# Patient Record
Sex: Female | Born: 1975 | Race: Black or African American | Hispanic: No | Marital: Single | Smoking: Never smoker
Health system: Southern US, Community
[De-identification: ages and names within clinical notes are randomized; demographics above are authoritative.]

## PROBLEM LIST (undated history)

## (undated) ENCOUNTER — Emergency Department (HOSPITAL_BASED_OUTPATIENT_CLINIC_OR_DEPARTMENT_OTHER): Admission: EM | Payer: 59 | Source: Home / Self Care

## (undated) DIAGNOSIS — I10 Essential (primary) hypertension: Secondary | ICD-10-CM

## (undated) DIAGNOSIS — G932 Benign intracranial hypertension: Secondary | ICD-10-CM

## (undated) DIAGNOSIS — I509 Heart failure, unspecified: Secondary | ICD-10-CM

## (undated) DIAGNOSIS — E669 Obesity, unspecified: Secondary | ICD-10-CM

## (undated) DIAGNOSIS — G43909 Migraine, unspecified, not intractable, without status migrainosus: Secondary | ICD-10-CM

## (undated) DIAGNOSIS — G4730 Sleep apnea, unspecified: Secondary | ICD-10-CM

## (undated) HISTORY — PX: KNEE SURGERY: SHX244

## (undated) HISTORY — PX: ARTHROSCOPIC REPAIR ACL: SUR80

## (undated) HISTORY — PX: CHOLECYSTECTOMY: SHX55

## (undated) HISTORY — PX: MYOMECTOMY: SHX85

---

## 2011-05-12 ENCOUNTER — Emergency Department (HOSPITAL_COMMUNITY): Payer: Self-pay

## 2011-05-12 ENCOUNTER — Emergency Department (HOSPITAL_COMMUNITY)
Admission: EM | Admit: 2011-05-12 | Discharge: 2011-05-12 | Disposition: A | Discharge status: Home / Self Care | Payer: Self-pay | Attending: Emergency Medicine | Admitting: Emergency Medicine

## 2011-05-12 ENCOUNTER — Encounter: Payer: Self-pay | Admitting: *Deleted

## 2011-05-12 DIAGNOSIS — G43909 Migraine, unspecified, not intractable, without status migrainosus: Secondary | ICD-10-CM | POA: Insufficient documentation

## 2011-05-12 DIAGNOSIS — S93409A Sprain of unspecified ligament of unspecified ankle, initial encounter: Secondary | ICD-10-CM | POA: Insufficient documentation

## 2011-05-12 DIAGNOSIS — I10 Essential (primary) hypertension: Secondary | ICD-10-CM | POA: Insufficient documentation

## 2011-05-12 DIAGNOSIS — G932 Benign intracranial hypertension: Secondary | ICD-10-CM | POA: Insufficient documentation

## 2011-05-12 DIAGNOSIS — X500XXA Overexertion from strenuous movement or load, initial encounter: Secondary | ICD-10-CM | POA: Insufficient documentation

## 2011-05-12 HISTORY — DX: Benign intracranial hypertension: G93.2

## 2011-05-12 HISTORY — DX: Essential (primary) hypertension: I10

## 2011-05-12 HISTORY — DX: Migraine, unspecified, not intractable, without status migrainosus: G43.909

## 2011-05-12 MED ORDER — ACETAMINOPHEN 500 MG PO TABS
1000.0000 mg | ORAL_TABLET | Freq: Once | ORAL | Status: AC
  Administered 2011-05-12: 1000 mg via ORAL
  Filled 2011-05-12: qty 2

## 2011-05-12 MED ORDER — HYDROCODONE-ACETAMINOPHEN 5-325 MG PO TABS
1.0000 | ORAL_TABLET | ORAL | Status: AC | PRN
Start: 2011-05-12 — End: 2011-05-22

## 2011-05-12 NOTE — ED Notes (Signed)
Pt c/o pain in her left knee, ankle and foot after falling and twisting it last night. States that she can't bear weight on it.

## 2011-05-13 NOTE — ED Provider Notes (Signed)
Medical screening examination/treatment/procedure(s) were performed by non-physician practitioner and as supervising physician I was immediately available for consultation/collaboration.   Shelda Jakes, MD 05/13/11 [phone]

## 2011-05-13 NOTE — ED Provider Notes (Signed)
History     CSN: [id number] Arrival date & time: 05/12/2011  9:47 AM   First MD Initiated Contact with Patient 05/12/11 [phone]      Chief Complaint  Patient presents with  . Leg Pain    (Consider location/radiation/quality/duration/timing/severity/associated sxs/prior treatment) Patient is a 35 y.o. female presenting with ankle pain. The history is provided by the patient.  Ankle Pain  The incident occurred 12 to 24 hours ago. The incident occurred in the street (She was at a Christmas parade last night,  when she stepped wrong,  causing inversion to her left ankle.  She has pain at her left lateral ankle and is also tender at her left lateral knee.). The injury mechanism was torsion. The pain is present in the left ankle and left knee. The pain is at a severity of 10/10. The pain is severe. The pain has been constant since onset. Associated symptoms include inability to bear weight. Pertinent negatives include no numbness, no loss of sensation and no tingling. She reports no foreign bodies present. The symptoms are aggravated by bearing weight, palpation and activity. She has tried acetaminophen for the symptoms. The treatment provided no relief.    Past Medical History  Diagnosis Date  . Hypertension   . Pseudotumor cerebri   . Migraines     Past Surgical History  Procedure Date  . Cesarean section   . Cholecystectomy   . Myomectomy   . Knee surgery     right    History reviewed. No pertinent family history.  History  Substance Use Topics  . Smoking status: Never Smoker   . Smokeless tobacco: Not on file  . Alcohol Use: Not on file     occasionally    OB History    Grav Para Term Preterm Abortions TAB SAB Ect Mult Living                  Review of Systems  Constitutional: Negative for fever.  HENT: Negative for congestion, sore throat and neck pain.   Eyes: Negative.   Respiratory: Negative for chest tightness and shortness of breath.   Cardiovascular: Negative  for chest pain.  Gastrointestinal: Negative for nausea and abdominal pain.  Genitourinary: Negative.   Musculoskeletal: Positive for joint swelling, arthralgias and gait problem.  Skin: Negative.  Negative for rash and wound.  Neurological: Negative for dizziness, tingling, weakness, light-headedness, numbness and headaches.  Hematological: Negative.   Psychiatric/Behavioral: Negative.     Allergies  Imitrex and Ibuprofen  Home Medications   Current Outpatient Rx  Name Route Sig Dispense Refill  . ACETAMINOPHEN 500 MG PO TABS Oral Take 1,000 mg by mouth every 6 (six) hours as needed. Pain     . AMLODIPINE BESYLATE 10 MG PO TABS Oral Take 10 mg by mouth daily.      Marland Kitchen HYDROCHLOROTHIAZIDE 12.5 MG PO TABS Oral Take 12.5 mg by mouth daily.      Marland Kitchen LOSARTAN POTASSIUM 50 MG PO TABS Oral Take 50 mg by mouth daily.      Marland Kitchen NAPROXEN SODIUM 220 MG PO TABS Oral Take 220 mg by mouth 2 (two) times daily as needed.      Marland Kitchen HYDROCODONE-ACETAMINOPHEN 5-325 MG PO TABS Oral Take 1 tablet by mouth every 4 (four) hours as needed for pain. 20 tablet 0    BP 179/108  Pulse 109  Temp(Src) 98 F (36.7 C) (Oral)  Resp 20  Ht 5\' 3"  (1.6 m)  Wt 248 lb (112.492  kg)  BMI 43.93 kg/m2  SpO2 100%  LMP 05/05/2011  Physical Exam  Nursing note and vitals reviewed. Constitutional: She is oriented to person, place, and time. She appears well-developed and well-nourished.  HENT:  Head: Normocephalic.  Eyes: Conjunctivae are normal.  Neck: Normal range of motion.  Cardiovascular: Normal rate and intact distal pulses.  Exam reveals no decreased pulses.   Pulses:      Dorsalis pedis pulses are 2+ on the right side, and 2+ on the left side.       Posterior tibial pulses are 2+ on the right side, and 2+ on the left side.  Pulmonary/Chest: Effort normal.  Musculoskeletal: She exhibits edema and tenderness.       Left ankle: She exhibits decreased range of motion and swelling. She exhibits normal pulse. tenderness.  Lateral malleolus, CF ligament and proximal fibula tenderness found. Achilles tendon normal.  Neurological: She is alert and oriented to person, place, and time. No sensory deficit.  Skin: Skin is warm, dry and intact.    ED Course  Procedures (including critical care time)  Labs Reviewed - No data to display Dg Ankle Complete Left  05/12/2011  *RADIOLOGY REPORT*  Clinical Data: Fall, inversion injury, lateral ankle sprain  LEFT ANKLE COMPLETE - 3+ VIEW  Comparison: None  Findings:  No fracture or dislocation is seen.  The ankle mortise is intact.  The base of the fifth metatarsal is unremarkable.  Moderate diffuse ankle swelling.  IMPRESSION: No fracture or dislocation is seen.  Moderate diffuse ankle swelling.  Original Report Authenticated By: Charline Bills, M.D.   Dg Knee Complete 4 Views Left  05/12/2011  *RADIOLOGY REPORT*  Clinical Data: Fall, knee pain  LEFT KNEE - COMPLETE 4+ VIEW  Comparison: None.  Findings: No fracture or dislocation is seen.  The joint spaces are preserved.  The visualized soft tissues are unremarkable.  No suprapatellar knee joint effusion.  IMPRESSION: No acute osseous abnormality is seen.  Original Report Authenticated By: Charline Bills, M.D.     1. Ankle sprain       MDM  RICE,  [place],  Crutches,  Hydrocodone.  Referral to Dr. Romeo Apple in 1 week if no meaningful improvement by then.        Candis Musa, PA 05/13/11 1513

## 2011-05-20 ENCOUNTER — Emergency Department (HOSPITAL_COMMUNITY)
Admission: EM | Admit: 2011-05-20 | Discharge: 2011-05-20 | Disposition: A | Discharge status: Home / Self Care | Payer: Self-pay | Attending: Emergency Medicine | Admitting: Emergency Medicine

## 2011-05-20 ENCOUNTER — Emergency Department (HOSPITAL_COMMUNITY): Payer: Self-pay

## 2011-05-20 ENCOUNTER — Encounter (HOSPITAL_COMMUNITY): Payer: Self-pay | Admitting: Emergency Medicine

## 2011-05-20 DIAGNOSIS — R51 Headache: Secondary | ICD-10-CM

## 2011-05-20 DIAGNOSIS — G43909 Migraine, unspecified, not intractable, without status migrainosus: Secondary | ICD-10-CM | POA: Insufficient documentation

## 2011-05-20 DIAGNOSIS — I10 Essential (primary) hypertension: Secondary | ICD-10-CM | POA: Insufficient documentation

## 2011-05-20 LAB — BASIC METABOLIC PANEL
CO2: 29 mEq/L (ref 19–32)
Calcium: 9.4 mg/dL (ref 8.4–10.5)
Creatinine, Ser: 0.77 mg/dL (ref 0.50–1.10)
Glucose, Bld: 126 mg/dL — ABNORMAL HIGH (ref 70–99)

## 2011-05-20 LAB — DIFFERENTIAL
Basophils Absolute: 0.1 10*3/uL (ref 0.0–0.1)
Eosinophils Relative: 3 % (ref 0–5)
Lymphocytes Relative: 21 % (ref 12–46)
Monocytes Absolute: 0.5 10*3/uL (ref 0.1–1.0)

## 2011-05-20 LAB — CBC
HCT: 39 % (ref 36.0–46.0)
MCV: 82.3 fL (ref 78.0–100.0)
RDW: 13.7 % (ref 11.5–15.5)
WBC: 8.7 10*3/uL (ref 4.0–10.5)

## 2011-05-20 MED ORDER — ONDANSETRON HCL 4 MG PO TABS: 4.0000 mg | ORAL_TABLET | Freq: Four times a day (QID) | ORAL | Status: AC

## 2011-05-20 MED ORDER — ONDANSETRON HCL 4 MG/2ML IJ SOLN
4.0000 mg | Freq: Once | INTRAMUSCULAR | Status: AC
  Administered 2011-05-20: 4 mg via INTRAVENOUS
  Filled 2011-05-20: qty 2

## 2011-05-20 MED ORDER — HYDROMORPHONE HCL PF 1 MG/ML IJ SOLN
1.0000 mg | Freq: Once | INTRAMUSCULAR | Status: AC
Start: 2011-05-20 — End: 2011-05-20
  Administered 2011-05-20: 1 mg via INTRAVENOUS
  Filled 2011-05-20: qty 1

## 2011-05-20 MED ORDER — OXYCODONE-ACETAMINOPHEN 5-325 MG PO TABS: 2.0000 | ORAL_TABLET | ORAL | Status: AC | PRN

## 2011-05-20 MED ORDER — HYDROMORPHONE HCL PF 1 MG/ML IJ SOLN
INTRAMUSCULAR | Status: AC
  Administered 2011-05-20: 1 mg via INTRAVENOUS
  Filled 2011-05-20: qty 1

## 2011-05-20 MED ORDER — HYDROMORPHONE HCL PF 1 MG/ML IJ SOLN
1.0000 mg | Freq: Once | INTRAMUSCULAR | Status: AC
Start: 2011-05-20 — End: 2011-05-20
  Administered 2011-05-20: 1 mg via INTRAVENOUS

## 2011-05-20 MED ORDER — SODIUM CHLORIDE 0.9 % IV SOLN
INTRAVENOUS | Status: DC
  Administered 2011-05-20: 07:00:00 via INTRAVENOUS

## 2011-05-20 MED ORDER — HOMATROPINE HBR 5 % OP SOLN
OPHTHALMIC | Status: AC
  Filled 2011-05-20: qty 5

## 2011-05-20 NOTE — ED Provider Notes (Signed)
History     CSN: [id number] Arrival date & time: 05/20/2011  6:32 AM   First MD Initiated Contact with Patient 05/20/11 [phone]      Chief Complaint  Patient presents with  . Migraine    (Consider location/radiation/quality/duration/timing/severity/associated sxs/prior treatment) Patient is a 35 y.o. female presenting with migraine. The history is provided by the patient.  Migraine This is a new problem. The current episode started 3 to 5 hours ago. The problem occurs constantly. The problem has not changed since onset.Pertinent negatives include no chest pain and no abdominal pain. The symptoms are aggravated by nothing. The symptoms are relieved by nothing. She has tried acetaminophen for the symptoms. The treatment provided no relief.   She has had a headache like this previously with her pseudotumor cerebri. She was in the ED, recently with an ankle sprain. She is visiting [place] from [place], [place].  Past Medical History  Diagnosis Date  . Hypertension   . Pseudotumor cerebri   . Migraines     Past Surgical History  Procedure Date  . Cesarean section   . Cholecystectomy   . Myomectomy   . Knee surgery     right    No family history on file.  History  Substance Use Topics  . Smoking status: Never Smoker   . Smokeless tobacco: Not on file  . Alcohol Use: Yes     occasionally    OB History    Grav Para Term Preterm Abortions TAB SAB Ect Mult Living                  Review of Systems  Cardiovascular: Negative for chest pain.  Gastrointestinal: Negative for abdominal pain.  All other systems reviewed and are negative.    Allergies  Imitrex and Ibuprofen  Home Medications   Current Outpatient Rx  Name Route Sig Dispense Refill  . ACETAMINOPHEN 500 MG PO TABS Oral Take 1,000 mg by mouth every 6 (six) hours as needed. Pain     . AMLODIPINE BESYLATE 10 MG PO TABS Oral Take 10 mg by mouth daily.      Marland Kitchen HYDROCHLOROTHIAZIDE 12.5 MG PO TABS Oral Take  12.5 mg by mouth daily.      Marland Kitchen HYDROCODONE-ACETAMINOPHEN 5-325 MG PO TABS Oral Take 1 tablet by mouth every 4 (four) hours as needed for pain. 20 tablet 0  . LOSARTAN POTASSIUM 50 MG PO TABS Oral Take 50 mg by mouth daily.      Marland Kitchen NAPROXEN SODIUM 220 MG PO TABS Oral Take 220 mg by mouth 2 (two) times daily as needed.      Marland Kitchen ONDANSETRON HCL 4 MG PO TABS Oral Take 1 tablet (4 mg total) by mouth every 6 (six) hours. 12 tablet 0  . OXYCODONE-ACETAMINOPHEN 5-325 MG PO TABS Oral Take 2 tablets by mouth every 4 (four) hours as needed for pain. 15 tablet 0    BP 188/105  Pulse 87  Temp(Src) 98.1 F (36.7 C) (Oral)  Resp 20  Ht 5\' 3"  (1.6 m)  Wt 246 lb (111.585 kg)  BMI 43.58 kg/m2  SpO2 97%  LMP 05/05/2011  Physical Exam  Nursing note and vitals reviewed. Constitutional: She is oriented to person, place, and time. She appears well-developed and well-nourished. She appears distressed (Appears uncomfortable).  HENT:  Head: Normocephalic and atraumatic.  Eyes: Conjunctivae and EOM are normal. Pupils are equal, round, and reactive to light. Right eye exhibits no discharge. Left eye exhibits no discharge. No scleral  icterus.  Neck: Normal range of motion. Neck supple.  Cardiovascular: Normal rate.   Pulmonary/Chest: Effort normal and breath sounds normal. No respiratory distress.  Abdominal: Soft. Bowel sounds are normal.  Musculoskeletal: Normal range of motion.  Neurological: She is alert and oriented to person, place, and time. No cranial nerve deficit. Coordination normal.  Skin: Skin is warm and dry.  Psychiatric: She has a normal mood and affect. Her behavior is normal. Judgment and thought content normal.    ED Course  Procedures (including critical care time) ED treatment: Repeat blood pressure remained elevated 186/120 0640.  Labs Reviewed  BASIC METABOLIC PANEL - Abnormal; Notable for the following:    Glucose, Bld 126 (*)    All other components within normal limits  CBC    DIFFERENTIAL  URINALYSIS, ROUTINE W REFLEX MICROSCOPIC  URINE RAPID DRUG SCREEN (HOSP PERFORMED)   Headache, treated with Dilaudid, and Zofran- 0700 BP improved to 197/111. After CT, pt c/o only mild improvement of pain, with BP 188/105. Repeat Analgesia ordered.   08:08- BP 169/102. Pt feels some better. Attempted to view fundi, but pupils small, equal. Homatropine given for exam.  08:42- eyes are dialated; fundi per me appear normal with sharp medial discs.          Ct Head Wo Contrast  05/20/2011  *RADIOLOGY REPORT*  Clinical Data: Headache  CT HEAD WITHOUT CONTRAST  Technique:  Contiguous axial images were obtained from the base of the skull through the vertex without contrast.  Comparison: None.  Findings: No skull fracture is noted.  Paranasal sinuses and mastoid air cells are unremarkable.  No intracranial hemorrhage, mass effect or midline shift.  The gray and white matter differentiation is preserved.  No acute infarction.  No mass lesion is noted on this unenhanced scan.  IMPRESSION: No acute intracranial abnormality.  Original Report Authenticated By: Natasha Mead, M.D.     1. Headache   2. Hypertension       MDM  Headache with hypertension , and history of pseudotumor cerebri.  Doubt hypertensive urgency, meningitis, exacerbation of pseudotumor cerebri.        Flint Melter, MD 05/20/11 [phone]

## 2011-05-20 NOTE — ED Notes (Signed)
Pt states migraine pain starting approx 2 hours.  Pt reports she has taken an aleve and two tylenol without relief.  Pt reports dizziness and vomiting x 5.  Pt states emesis went from clear liquid to yellow liquid.

## 2011-05-20 NOTE — ED Notes (Signed)
Pt made aware that a urine specimen is needed for lab testing. Pt unable to give one at this time. Call light was left in reach. NAD noted at this time.

## 2011-05-20 NOTE — ED Notes (Signed)
EDP at bedside  

## 2011-10-11 ENCOUNTER — Emergency Department (HOSPITAL_COMMUNITY)
Admission: EM | Admit: 2011-10-11 | Discharge: 2011-10-12 | Disposition: A | Discharge status: Home / Self Care | Payer: PRIVATE HEALTH INSURANCE | Attending: Emergency Medicine | Admitting: Emergency Medicine

## 2011-10-11 ENCOUNTER — Encounter (HOSPITAL_COMMUNITY): Payer: Self-pay | Admitting: *Deleted

## 2011-10-11 ENCOUNTER — Emergency Department (HOSPITAL_COMMUNITY): Payer: PRIVATE HEALTH INSURANCE

## 2011-10-11 DIAGNOSIS — M2240 Chondromalacia patellae, unspecified knee: Secondary | ICD-10-CM | POA: Insufficient documentation

## 2011-10-11 DIAGNOSIS — M25469 Effusion, unspecified knee: Secondary | ICD-10-CM | POA: Insufficient documentation

## 2011-10-11 MED ORDER — ONDANSETRON 8 MG PO TBDP
8.0000 mg | ORAL_TABLET | Freq: Once | ORAL | Status: AC
  Administered 2011-10-12: 8 mg via ORAL
  Filled 2011-10-11: qty 1

## 2011-10-11 MED ORDER — HYDROMORPHONE HCL PF 1 MG/ML IJ SOLN
1.0000 mg | Freq: Once | INTRAMUSCULAR | Status: AC
  Administered 2011-10-12: 1 mg via INTRAMUSCULAR
  Filled 2011-10-11: qty 1

## 2011-10-11 NOTE — ED Notes (Signed)
Lt knee pain , NO known injury,

## 2011-10-12 MED ORDER — OXYCODONE-ACETAMINOPHEN 5-325 MG PO TABS: 1.0000 | ORAL_TABLET | ORAL | Status: AC | PRN

## 2011-10-12 MED ORDER — HYDROCHLOROTHIAZIDE 25 MG PO TABS: 25.0000 mg | ORAL_TABLET | Freq: Every day | ORAL | Status: DC

## 2011-10-12 NOTE — ED Provider Notes (Signed)
History     CSN: [id number]  Arrival date & time 10/11/11  2040   First MD Initiated Contact with Patient 10/11/11 2043      Chief Complaint  Patient presents with  . Knee Pain    (Consider location/radiation/quality/duration/timing/severity/associated sxs/prior treatment) HPI Comments: Annette Deleon presents for evaluation of increasing left knee pain.  She denies any obvious injury, but states intermittent twinges of pain in her left knee for some time now, which became persistent 2 weeks ago and now has become near intolerable.  She describes a deep throbbing pain in her knee joint with a sharp stabbing pain which radiates both into her lower extremity and up into her mid thigh with weightbearing and range of motion.  She denies fevers,  Chills, trauma, swelling.  She does have a previous history of trauma to the right knee which required arthroscopic surgery 6 years ago.    Patient is a 36 y.o. female presenting with knee pain. The history is provided by the patient.  Knee Pain Associated symptoms include arthralgias. Pertinent negatives include no abdominal pain, chest pain, congestion, fever, headaches, joint swelling, nausea, neck pain, numbness, rash, sore throat or weakness.    Past Medical History  Diagnosis Date  . Hypertension   . Pseudotumor cerebri   . Migraines     Past Surgical History  Procedure Date  . Cesarean section   . Cholecystectomy   . Myomectomy   . Knee surgery     right    History reviewed. No pertinent family history.  History  Substance Use Topics  . Smoking status: Never Smoker   . Smokeless tobacco: Not on file  . Alcohol Use: Yes     occasionally    OB History    Grav Para Term Preterm Abortions TAB SAB Ect Mult Living                  Review of Systems  Constitutional: Negative for fever.  HENT: Negative for congestion, sore throat and neck pain.   Eyes: Negative.   Respiratory: Negative for chest tightness and shortness of  breath.   Cardiovascular: Negative for chest pain.  Gastrointestinal: Negative for nausea and abdominal pain.  Genitourinary: Negative.   Musculoskeletal: Positive for arthralgias. Negative for joint swelling.  Skin: Negative.  Negative for rash and wound.  Neurological: Negative for dizziness, weakness, light-headedness, numbness and headaches.  Hematological: Negative.   Psychiatric/Behavioral: Negative.     Allergies  Imitrex; Yellow jacket venom; and Ibuprofen  Home Medications   Current Outpatient Rx  Name Route Sig Dispense Refill  . ACETAMINOPHEN 500 MG PO TABS Oral Take 1,000-1,500 mg by mouth every 6 (six) hours as needed. Pain    . AMLODIPINE BESYLATE 10 MG PO TABS Oral Take 10 mg by mouth daily.      Marland Kitchen GREEN TEA (CAMILLIA SINENSIS) 250 MG PO CAPS Oral Take 3 capsules by mouth 2 (two) times daily with a meal.    . HYDROCHLOROTHIAZIDE 12.5 MG PO TABS Oral Take 12.5 mg by mouth daily.      Marland Kitchen LOSARTAN POTASSIUM 50 MG PO TABS Oral Take 50 mg by mouth daily.      Marland Kitchen NAPROXEN SODIUM 220 MG PO TABS Oral Take 440 mg by mouth as needed. For pain    . TETRAHYDROZOLINE-ZN SULFATE 0.05-0.25 % OP SOLN Both Eyes Place 2 drops into both eyes daily as needed. For allergy eye relief    . OXYCODONE-ACETAMINOPHEN 5-325 MG PO TABS  Oral Take 1 tablet by mouth every 4 (four) hours as needed for pain. 30 tablet 0    BP 194/129  Pulse 87  Temp(Src) 98.1 F (36.7 C) (Oral)  Resp 22  Ht 5\' 6"  (1.676 m)  Wt 262 lb (118.842 kg)  BMI 42.29 kg/m2  SpO2 100%  LMP 09/10/2011  Physical Exam  Nursing note and vitals reviewed. Constitutional: She appears well-developed and well-nourished.  HENT:  Head: Normocephalic.  Cardiovascular: Normal rate and intact distal pulses.  Exam reveals no decreased pulses.   Pulses:      Dorsalis pedis pulses are 2+ on the left side.  Musculoskeletal: She exhibits tenderness.       Left knee: She exhibits decreased range of motion and effusion. She exhibits no  erythema, no LCL laxity and no MCL laxity. tenderness found. Medial joint line tenderness noted.  Neurological: She is alert. No sensory deficit.  Skin: Skin is warm, dry and intact.    ED Course  Procedures (including critical care time)  Labs Reviewed - No data to display Dg Knee Complete 4 Views Left  10/11/2011  *RADIOLOGY REPORT*  Clinical Data: Knee pain for 2 weeks.  LEFT KNEE - COMPLETE 4+ VIEW  Comparison: Plain films 05/12/2011.  Findings: No acute bony or joint abnormality is identified.  Small defect of subchondral bone of the mid pole of the talus compatible with chondromalacia.  There is a joint effusion.  IMPRESSION:  1.  No acute finding. 2.  Chondromalacia patella and joint effusion.  Original Report Authenticated By: Bernadene Bell. Maricela Curet, M.D.     1. Joint effusion of knee   2. Chondromalacia patella, left     Patient given dilaudid 1 mg IM and did obtain improvement in pain.  Knee immobilizer given.  Pt has crutches at home.  MDM  Acute on chronic pain with xray evidence for chondromalacia patella with effusion.  Patient encourged RICE,  Percocet prescribed.  Work note given.  Advised f/u with ortho.  Referred to Dr. Hilda Lias.  Pt also works as Charity fundraiser at [employer] - may decide to contact Dr. Charlann Boxer or Dr. Lequita Halt for f/u care.  Discussed BP - she has not taken her evening hctz yet,  But bp is improving with institution of pain med.  Pt encouraged to check bp over the next several days.  Denies ha,  Visual changes,  No sob,  No cp.        Burgess Amor, PA 10/12/11 1130

## 2011-10-12 NOTE — Discharge Instructions (Signed)
Knee Effusion The medical term for having fluid in your knee is effusion. This is often due to an internal derangement of the knee. This means something is wrong inside the knee. Some of the causes of fluid in the knee may be torn cartilage, a torn ligament, or bleeding into the joint from an injury. Your knee is likely more difficult to bend and move. This is often because there is increased pain and pressure in the joint. The time it takes for recovery from a knee effusion depends on different factors, including:   Type of injury.   Your age.   Physical and medical conditions.   Rehabilitation Strategies.  How long you will be away from your normal activities will depend on what kind of knee problem you have and how much damage is present. Your knee has two types of cartilage. Articular cartilage covers the bone ends and lets your knee bend and move smoothly. Two menisci, thick pads of cartilage that form a rim inside the joint, help absorb shock and stabilize your knee. Ligaments bind the bones together and support your knee joint. Muscles move the joint, help support your knee, and take stress off the joint itself. CAUSES  Often an effusion in the knee is caused by an injury to one of the menisci. This is often a tear in the cartilage. Recovery after a meniscus injury depends on how much meniscus is damaged and whether you have damaged other knee tissue. Small tears may heal on their own with conservative treatment. Conservative means rest, limited weight bearing activity and muscle strengthening exercises. Your recovery may take up to 6 weeks.  TREATMENT  Larger tears may require surgery. Meniscus injuries may be treated during arthroscopy. Arthroscopy is a procedure in which your surgeon uses a small telescope like instrument to look in your knee. Your caregiver can make a more accurate diagnosis (learning what is wrong) by performing an arthroscopic procedure. If your injury is on the inner  margin of the meniscus, your surgeon may trim the meniscus back to a smooth rim. In other cases your surgeon will try to repair a damaged meniscus with stitches (sutures). This may make rehabilitation take longer, but may provide better long term result by helping your knee keep its shock absorption capabilities. Ligaments which are completely torn usually require surgery for repair. HOME CARE INSTRUCTIONS  Use crutches as instructed.   If a brace is applied, use as directed.   Once you are home, an ice pack applied to your swollen knee may help with discomfort and help decrease swelling.   Keep your knee raised (elevated) when you are not up and around or on crutches.   Only take over-the-counter or prescription medicines for pain, discomfort, or fever as directed by your caregiver.   Your caregivers will help with instructions for rehabilitation of your knee. This often includes strengthening exercises.   You may resume a normal diet and activities as directed.  SEEK MEDICAL CARE IF:   There is increased swelling in your knee.   You notice redness, swelling, or increasing pain in your knee.   An unexplained oral temperature above 102 F (38.9 C) develops.  SEEK IMMEDIATE MEDICAL CARE IF:   You develop a rash.   You have difficulty breathing.   You have any allergic reactions from medications you may have been given.   There is severe pain with any motion of the knee.  MAKE SURE YOU:   Understand these instructions.  Will watch your condition.   Will get help right away if you are not doing well or get worse.  Document Released: 08/10/2003 Document Revised: 05/09/2011 Document Reviewed: 10/14/2007 [facility] Patient Information 2012 [place], [place].   Use the crutches that you have at home to help minimize weightbearing on your left knee.  Use the knee immobilizer to help protect and stabilize the knee joint.  Ice and elevation may also help with pain and swelling.  Use  the Percocet for pain relief, this will make you drowsy so do not drive within 4 hours of taking this medication.  Please call Dr. Hilda Lias on Monday for further evaluation and treatment of your knee injury.

## 2011-10-12 NOTE — ED Provider Notes (Signed)
Medical screening examination/treatment/procedure(s) were performed by non-physician practitioner and as supervising physician I was immediately available for consultation/collaboration.  Kyo Cocuzza, MD 10/12/11 1537 

## 2011-10-21 ENCOUNTER — Other Ambulatory Visit (HOSPITAL_COMMUNITY): Payer: Self-pay | Admitting: Orthopedic Surgery

## 2011-10-21 DIAGNOSIS — M25562 Pain in left knee: Secondary | ICD-10-CM

## 2011-10-24 ENCOUNTER — Other Ambulatory Visit (HOSPITAL_COMMUNITY): Payer: PRIVATE HEALTH INSURANCE

## 2011-10-25 ENCOUNTER — Ambulatory Visit (HOSPITAL_COMMUNITY)
Admission: RE | Admit: 2011-10-25 | Discharge: 2011-10-25 | Disposition: A | Discharge status: Home / Self Care | Payer: PRIVATE HEALTH INSURANCE | Source: Ambulatory Visit | Attending: Orthopedic Surgery | Admitting: Orthopedic Surgery

## 2011-10-25 DIAGNOSIS — M659 Unspecified synovitis and tenosynovitis, unspecified site: Secondary | ICD-10-CM | POA: Insufficient documentation

## 2011-10-25 DIAGNOSIS — IMO0002 Reserved for concepts with insufficient information to code with codable children: Secondary | ICD-10-CM | POA: Insufficient documentation

## 2011-10-25 DIAGNOSIS — M2240 Chondromalacia patellae, unspecified knee: Secondary | ICD-10-CM | POA: Insufficient documentation

## 2011-10-25 DIAGNOSIS — M25562 Pain in left knee: Secondary | ICD-10-CM

## 2011-10-25 DIAGNOSIS — X58XXXA Exposure to other specified factors, initial encounter: Secondary | ICD-10-CM | POA: Insufficient documentation

## 2011-11-20 ENCOUNTER — Emergency Department (HOSPITAL_COMMUNITY): Payer: PRIVATE HEALTH INSURANCE

## 2011-11-20 ENCOUNTER — Inpatient Hospital Stay (HOSPITAL_COMMUNITY)
Admission: EM | Admit: 2011-11-20 | Discharge: 2011-11-21 | DRG: 103 | Disposition: A | Discharge status: Home / Self Care | Payer: PRIVATE HEALTH INSURANCE | Attending: Internal Medicine | Admitting: Internal Medicine

## 2011-11-20 ENCOUNTER — Encounter (HOSPITAL_COMMUNITY): Payer: Self-pay

## 2011-11-20 ENCOUNTER — Inpatient Hospital Stay (HOSPITAL_COMMUNITY): Payer: PRIVATE HEALTH INSURANCE

## 2011-11-20 DIAGNOSIS — I10 Essential (primary) hypertension: Secondary | ICD-10-CM

## 2011-11-20 DIAGNOSIS — Z6841 Body Mass Index (BMI) 40.0 and over, adult: Secondary | ICD-10-CM

## 2011-11-20 DIAGNOSIS — I161 Hypertensive emergency: Secondary | ICD-10-CM

## 2011-11-20 DIAGNOSIS — G43809 Other migraine, not intractable, without status migrainosus: Secondary | ICD-10-CM

## 2011-11-20 DIAGNOSIS — G43109 Migraine with aura, not intractable, without status migrainosus: Principal | ICD-10-CM | POA: Diagnosis present

## 2011-11-20 DIAGNOSIS — G932 Benign intracranial hypertension: Secondary | ICD-10-CM

## 2011-11-20 DIAGNOSIS — E669 Obesity, unspecified: Secondary | ICD-10-CM

## 2011-11-20 DIAGNOSIS — G44059 Short lasting unilateral neuralgiform headache with conjunctival injection and tearing (SUNCT), not intractable: Secondary | ICD-10-CM | POA: Diagnosis present

## 2011-11-20 LAB — DIFFERENTIAL
Basophils Absolute: 0.1 10*3/uL (ref 0.0–0.1)
Basophils Relative: 1 % (ref 0–1)
Eosinophils Absolute: 0.1 10*3/uL (ref 0.0–0.7)
Eosinophils Relative: 2 % (ref 0–5)
Neutrophils Relative %: 68 % (ref 43–77)

## 2011-11-20 LAB — CBC
MCH: 27 pg (ref 26.0–34.0)
MCHC: 33.3 g/dL (ref 30.0–36.0)
MCV: 81.3 fL (ref 78.0–100.0)
Platelets: 410 10*3/uL — ABNORMAL HIGH (ref 150–400)
RBC: 4.92 MIL/uL (ref 3.87–5.11)
RDW: 13.9 % (ref 11.5–15.5)

## 2011-11-20 LAB — BASIC METABOLIC PANEL
Calcium: 10.2 mg/dL (ref 8.4–10.5)
GFR calc Af Amer: >90 mL/min (ref >90)
GFR calc non Af Amer: 85 mL/min — ABNORMAL LOW (ref >90)
Glucose, Bld: 103 mg/dL — ABNORMAL HIGH (ref 70–99)
Potassium: 3.6 mEq/L (ref 3.5–5.1)
Sodium: 136 mEq/L (ref 135–145)

## 2011-11-20 MED ORDER — AMLODIPINE BESYLATE 5 MG PO TABS
10.0000 mg | ORAL_TABLET | Freq: Every day | ORAL | Status: DC
  Administered 2011-11-20 – 2011-11-21 (×2): 10 mg via ORAL
  Filled 2011-11-20 (×2): qty 2

## 2011-11-20 MED ORDER — METOCLOPRAMIDE HCL 5 MG/ML IJ SOLN
10.0000 mg | Freq: Once | INTRAMUSCULAR | Status: AC
  Administered 2011-11-20: 10 mg via INTRAVENOUS
  Filled 2011-11-20: qty 2

## 2011-11-20 MED ORDER — NAPHAZOLINE HCL 0.1 % OP SOLN
2.0000 [drp] | Freq: Four times a day (QID) | OPHTHALMIC | Status: DC | PRN
  Filled 2011-11-20: qty 15

## 2011-11-20 MED ORDER — HYDROCHLOROTHIAZIDE 12.5 MG PO CAPS
12.5000 mg | ORAL_CAPSULE | Freq: Every day | ORAL | Status: DC
  Administered 2011-11-20 – 2011-11-21 (×2): 12.5 mg via ORAL
  Filled 2011-11-20 (×2): qty 1

## 2011-11-20 MED ORDER — LABETALOL HCL 5 MG/ML IV SOLN
20.0000 mg | Freq: Once | INTRAVENOUS | Status: AC
  Administered 2011-11-20: 20 mg via INTRAVENOUS
  Filled 2011-11-20: qty 4

## 2011-11-20 MED ORDER — ONDANSETRON HCL 4 MG/2ML IJ SOLN
4.0000 mg | Freq: Once | INTRAMUSCULAR | Status: AC
  Administered 2011-11-20: 4 mg via INTRAVENOUS

## 2011-11-20 MED ORDER — DIPHENHYDRAMINE HCL 50 MG/ML IJ SOLN
50.0000 mg | Freq: Once | INTRAMUSCULAR | Status: AC
  Administered 2011-11-20: 50 mg via INTRAVENOUS
  Filled 2011-11-20: qty 1

## 2011-11-20 MED ORDER — ONDANSETRON HCL 4 MG/2ML IJ SOLN
4.0000 mg | Freq: Four times a day (QID) | INTRAMUSCULAR | Status: DC | PRN
  Administered 2011-11-21: 4 mg via INTRAVENOUS
  Filled 2011-11-20 (×2): qty 2

## 2011-11-20 MED ORDER — OXYCODONE HCL 5 MG PO TABS
5.0000 mg | ORAL_TABLET | ORAL | Status: DC | PRN
  Administered 2011-11-20: 5 mg via ORAL
  Filled 2011-11-20: qty 1

## 2011-11-20 MED ORDER — HEPARIN SODIUM (PORCINE) 5000 UNIT/ML IJ SOLN
5000.0000 [IU] | Freq: Three times a day (TID) | INTRAMUSCULAR | Status: DC
  Administered 2011-11-20 – 2011-11-21 (×2): 5000 [IU] via SUBCUTANEOUS
  Filled 2011-11-20 (×4): qty 1

## 2011-11-20 MED ORDER — ONDANSETRON HCL 4 MG PO TABS: 4.0000 mg | ORAL_TABLET | Freq: Four times a day (QID) | ORAL | Status: DC | PRN

## 2011-11-20 MED ORDER — GADOBENATE DIMEGLUMINE 529 MG/ML IV SOLN
20.0000 mL | Freq: Once | INTRAVENOUS | Status: AC | PRN
  Administered 2011-11-20: 20 mL via INTRAVENOUS

## 2011-11-20 MED ORDER — LOSARTAN POTASSIUM 50 MG PO TABS
50.0000 mg | ORAL_TABLET | Freq: Every day | ORAL | Status: DC
  Administered 2011-11-20 – 2011-11-21 (×2): 50 mg via ORAL
  Filled 2011-11-20 (×2): qty 1

## 2011-11-20 MED ORDER — ACETAMINOPHEN 325 MG PO TABS: 650.0000 mg | ORAL_TABLET | ORAL | Status: DC | PRN

## 2011-11-20 NOTE — H&P (Signed)
Annette Deleon MRN: [id number] DOB/AGE: 01/12/1976 35 y.o.  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 No associated limb weakness or parathesiae. No LOC. H/O Pseudotumour cerebri but headache does not feel the same .  Past Medical History  Diagnosis Date  . Hypertension   . Pseudotumor cerebri   . Migraines    Past Surgical History  Procedure Date  . Cesarean section   . Cholecystectomy   . Myomectomy   . Knee surgery     right        No family history on file.  Social History:  Separated for 4 years.Lives with 2 kids and boyfriend.RN.Non-smoker. Occ alcohol.  Allergies:  Allergies  Allergen Reactions  . Imitrex (Sumatriptan Base) Anaphylaxis  . Yellow Jacket Venom (Bee Venom) Anaphylaxis  . Ibuprofen Nausea And Vomiting     (Not in a hospital admission)     FAO:ZHYQM from the symptoms mentioned above,there are no other symptoms referable to all systems reviewed.  Physical Exam: Blood pressure 147/101, pulse 83, temperature 98.1 F (36.7 C), temperature source Oral, resp. rate 17, height 5\' 4"  (1.626 m), weight 111.585 kg (246 lb), last menstrual period 11/19/2011, SpO2 100.00%. Looks well.Alert/orientated.Obese. No focal Neurology. CVS-WNL RESP-WNL ABDO-WNL     Basename 11/20/11 0927  WBC 8.6  NEUTROABS 5.9  HGB 13.3  HCT 40.0  MCV 81.3  PLT 410*    Basename 11/20/11 0927  NA 136  K 3.6  CL 98  CO2 27  GLUCOSE 103*  BUN 14  CREATININE 0.86  CALCIUM 10.2  MG --         Ct Head Wo Contrast  11/20/2011  *RADIOLOGY REPORT*  Clinical Data: Headache, numbness  CT HEAD WITHOUT CONTRAST  Technique:  Contiguous axial images were obtained from the base of the skull through the vertex without contrast.  Comparison:  05/20/2011  Findings: Normal ventricular morphology. No midline shift or mass effect. Normal appearance of brain parenchyma. No intracranial hemorrhage, mass lesion, or acute infarction. Visualized paranasal sinuses and mastoid air cells clear. Bones unremarkable.  IMPRESSION: No acute intracranial abnormalities.  Original Report Authenticated By: Lollie Marrow, M.D.   Mr Knee Left  Wo Contrast  10/25/2011  *RADIOLOGY REPORT*  Clinical Data: Left knee pain swelling for 1 month.  Question meniscal tear.  No previous relevant surgery.  MRI OF THE LEFT KNEE WITHOUT CONTRAST  Technique:  Multiplanar, multisequence MR imaging was performed. No intravenous contrast was administered.  Comparison: Radiographs 10/11/2011.  Findings:  Menisci:  There is an incomplete radial tear of the posterior horn of the medial meniscus near the meniscal root.  This is best seen on coronal image 10 but also has supportive evidence on the sagittal and axial sequences.  There is no centrally displaced meniscal fragment or complete meniscal detachment.  The lateral meniscus appears normal.  Cruciate ligaments:  There is mild ACL mucoid degeneration. Inflammatory changes extend anteriorly from the intercondylar notch into Hoffa's fat, asymmetric laterally.  The posterior cruciate ligament appears normal.  Collateral ligaments:  There is MCL degeneration without tear.  The lateral collateral ligament complex and structures of the posterolateral corner are intact.  Cartilage and bones:  There is multifocal patellar chondral surface irregularity and subchondral cyst formation at the apex and lateral facet.  The trochlear cartilage appears intact.  There is mild diffuse thinning of the articular cartilage in the medial compartment.  The lateral compartment cartilage appears intact. There is no evidence of acute fracture.  Extensor mechanism:  The quadriceps and patellar tendons and patellar retinacula appear normal.  Joint and soft tissues:   There is a moderate sized knee joint effusion.  No significant Baker's cyst or discrete loose body is identified.  IMPRESSION:  1.  Incomplete radial tear of the posterior horn of the medial meniscus near the meniscal root.  There is incomplete meniscal detachment. 2.  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 3.  Multifocal patellar chondromalacia. 4.  ACL mucoid degeneration with synovitis in the intercondylar notch and Hoffa's fat. 5.  No acute osteochondral or ligamentous findings.  Original Report Authenticated By: Gerrianne Scale, M.D.   Impression: Principal Problem:  1.Headache , short unilat neuralgiform, w/conjunctival-?cluster headache. injection/tearing  2. HTN (hypertension) 3. Pseudotumor cerebri  4.Obesity     Plan: 1.Admit 2.MRI BRAIN 3.Neurology consult. 4.?DC tomorrow if stable.      Wilson Singer Pager [phone]  11/20/2011, 1:04 PM

## 2011-11-20 NOTE — ED Notes (Signed)
Pt reports had very busy night at work and had slight headache around 4am this morning.  Was driving home from work and suddenly had blurred vision, severe headache, right sided facial numbness and pain in left side of neck.  Denies any numbness or weakness in extremities.

## 2011-11-20 NOTE — ED Notes (Signed)
Pt in radiology/MRI

## 2011-11-20 NOTE — ED Provider Notes (Signed)
History  This chart was scribed for Annette Gaskins, MD by Bennett Scrape. This patient was seen in room APA06/APA06 and the patient's care was started at 9:07AM.  CSN: [id number]  Arrival date & time 11/20/11  4098   First MD Initiated Contact with Patient 11/20/11 [phone]      Chief Complaint  Patient presents with  . Headache  . Illegal value: [    facial numbness    Patient is a 36 y.o. female presenting with headaches. The history is provided by the patient. No language interpreter was used.  Headache  This is a recurrent problem. The current episode started 3 to 5 hours ago. The problem occurs constantly. The problem has been gradually worsening. The pain is located in the right unilateral region. The pain is at a severity of 10/10. The pain is severe. The pain radiates to the left neck. She has tried nothing for the symptoms.    Annette Deleon is a 36 y.o. female who presents to the Emergency Department complaining of 5 hours of gradual onset, suddenly worsening, costant right-sided HA described as pressure concentrated around the right eye with associated blurred vision, right-sided facial numbness along the jaw, left-sided neck pain described as shooting and nausea. Pt states that the HA started gradually while she was at work then became more severe suddenly while she was driving home about 1.5 hours ago. She states that the pain and blurred vision were so severe, she had to pull over on the side of the road and wait till the blurred vision improved before she began driving again. She denies having any modifying factors. She denies taking OTC medications at home to improve symptoms. She has a h/o migraines from a pseudotumor but states that she has not had a HA "in a while". She reports that her last lumbar puncture for the pseudotumor was 3 years ago in [place], [place]. She rates her HA a 10 out of 10 currently and denies that this HA is similar to prior migraines. She states that  with the pseudotumor the HAs are gradual onset, gradually worsening while this one was worsening over several hours.  It was not maximal pain at onset.   She denies having any recent falls or tick bites. She denies any numbness or weakness in extremities, fever, back pain, agitation, urinary symptoms, SOB, chest pain, and rash as associated symptoms . She denies having a h/o aneurysm, CVA and MI. She has a h/o HTN. She is an occasional alcohol user but denies smoking.   Pt lives in [place]  Past Medical History  Diagnosis Date  . Hypertension   . Pseudotumor cerebri   . Migraines     Past Surgical History  Procedure Date  . Cesarean section   . Cholecystectomy   . Myomectomy   . Knee surgery     right    No family history on file.  History  Substance Use Topics  . Smoking status: Never Smoker   . Smokeless tobacco: Not on file  . Alcohol Use: Yes     occasionally    Review of Systems  A complete 10 system review of systems was obtained and all systems are negative except as noted in the HPI and PMH.    Allergies  Imitrex; Yellow jacket venom; and Ibuprofen  Home Medications   Current Outpatient Rx  Name Route Sig Dispense Refill  . ACETAMINOPHEN 500 MG PO TABS Oral Take 1,000-1,500 mg by mouth every 6 (six)  hours as needed. Pain    . AMLODIPINE BESYLATE 10 MG PO TABS Oral Take 10 mg by mouth daily.      Marland Kitchen GREEN TEA (CAMILLIA SINENSIS) 250 MG PO CAPS Oral Take 3 capsules by mouth 2 (two) times daily with a meal.    . HYDROCHLOROTHIAZIDE 12.5 MG PO TABS Oral Take 12.5 mg by mouth daily.      Marland Kitchen LOSARTAN POTASSIUM 50 MG PO TABS Oral Take 50 mg by mouth daily.      Marland Kitchen NAPROXEN SODIUM 220 MG PO TABS Oral Take 440 mg by mouth as needed. For pain    . TETRAHYDROZOLINE-ZN SULFATE 0.05-0.25 % OP SOLN Both Eyes Place 2 drops into both eyes daily as needed. For allergy eye relief      Triage Vitals: BP 216/131  Pulse 89  Temp 98.1 F (36.7 C) (Oral)  Resp 20  Ht 5\' 4"   (1.626 m)  Wt 246 lb (111.585 kg)  BMI 42.23 kg/m2  SpO2 100%  LMP 11/19/2011  Physical Exam  Nursing note and vitals reviewed.  CONSTITUTIONAL: Well developed/well nourished HEAD AND FACE: Normocephalic/atraumatic EYES: EOMI/PERRL, normal fundoscopic exam ENMT: Mucous membranes moist NECK: supple no meningeal signs, no bruits SPINE:entire spine nontender CV: S1/S2 noted, no murmurs/rubs/gallops noted LUNGS: Lungs are clear to auscultation bilaterally, no apparent distress ABDOMEN: soft, nontender, no rebound or guarding GU:no cva tenderness NEURO: Awake/alert, facies symmetric, no arm or leg drift is noted Aside from right sided facial numbness, all other cranial nerves tested and intact EXTREMITIES: pulses normal, full ROM SKIN: warm, color normal PSYCH: no abnormalities of mood noted  ED Course  Procedures  DIAGNOSTIC STUDIES: Oxygen Saturation is 100% on room air, normal by my interpretation.    COORDINATION OF CARE: 9:23AM-Discussed treatment plan which includes CT scan of head with pt and pt agreed to plan. Pt has no signs of pseudotumor at this time.  Reports not similar to prior episodes of pseudotumor. Given time course of onset, doubt SAH.  Aside from mild numbness no focal neuro deficits and would not be candidate for TPA at this time.  Pt does have significant HTN at this time.  Will follow closely 11:46AM-Pt rechecked and is still having numbness. Informed pt of lab and radiology reports. Discussed admission with pt and pt states that she will have to see if she can make arrangements for her children overnight. 12:47 PM Will admit patient for hypertensive emergency, no focal neuro deficits except facial numbness Still having headache D/w dr Karilyn Cota to see patient for admissio   MDM  Nursing notes including past medical history and social history reviewed and considered in documentation All labs/vitals reviewed and considered      Date: 11/20/2011  Rate:  88  Rhythm: normal sinus rhythm  QRS Axis: left  Intervals: normal  ST/T Wave abnormalities: nonspecific ST changes  Conduction Disutrbances:none     I personally performed the services described in this documentation, which was scribed in my presence. The recorded information has been reviewed and considered.      Annette Gaskins, MD 11/20/11 1249

## 2011-11-20 NOTE — ED Notes (Signed)
Patient does not need anything at this time. 

## 2011-11-20 NOTE — ED Notes (Signed)
Patient states she is feeling nauseated again. RN Hospital doctor aware.

## 2011-11-21 DIAGNOSIS — G43809 Other migraine, not intractable, without status migrainosus: Secondary | ICD-10-CM

## 2011-11-21 DIAGNOSIS — G932 Benign intracranial hypertension: Secondary | ICD-10-CM

## 2011-11-21 DIAGNOSIS — E669 Obesity, unspecified: Secondary | ICD-10-CM

## 2011-11-21 DIAGNOSIS — I10 Essential (primary) hypertension: Secondary | ICD-10-CM

## 2011-11-21 DIAGNOSIS — G43109 Migraine with aura, not intractable, without status migrainosus: Secondary | ICD-10-CM | POA: Diagnosis present

## 2011-11-21 LAB — COMPREHENSIVE METABOLIC PANEL
AST: 11 U/L (ref 0–37)
Albumin: 3.2 g/dL — ABNORMAL LOW (ref 3.5–5.2)
BUN: 12 mg/dL (ref 6–23)
Calcium: 9.3 mg/dL (ref 8.4–10.5)
Creatinine, Ser: 0.84 mg/dL (ref 0.50–1.10)
Total Protein: 7 g/dL (ref 6.0–8.3)

## 2011-11-21 LAB — CBC
HCT: 36.5 % (ref 36.0–46.0)
Hemoglobin: 12.2 g/dL (ref 12.0–15.0)
MCV: 82 fL (ref 78.0–100.0)
Platelets: 374 10*3/uL (ref 150–400)
RBC: 4.45 MIL/uL (ref 3.87–5.11)
WBC: 7.3 10*3/uL (ref 4.0–10.5)

## 2011-11-21 MED ORDER — BUTALBITAL-APAP-CAFFEINE 50-325-40 MG PO TABS: 1.0000 | ORAL_TABLET | Freq: Four times a day (QID) | ORAL | Status: DC | PRN

## 2011-11-21 MED ORDER — LOSARTAN POTASSIUM 50 MG PO TABS: 100.0000 mg | ORAL_TABLET | Freq: Every day | ORAL | Status: DC

## 2011-11-21 NOTE — Discharge Summary (Signed)
Physician Discharge Summary  Patient ID: Annette Deleon MRN: [id number] DOB/AGE: Aug 10, 1975 36 y.o.  Admit date: 11/20/2011 Discharge date: 11/21/2011  Primary Care Physician:  No primary provider on file.   Discharge Diagnoses:    Principal Problem:  *Complicated migraine Active Problems:  HTN (hypertension)  Pseudotumor cerebri  Obesity    Medication List  As of 11/21/2011  1:40 PM   TAKE these medications         acetaminophen 500 MG tablet   Commonly known as: TYLENOL   Take 1,000-1,500 mg by mouth every 6 (six) hours as needed. Pain      ALEVE 220 MG tablet   Generic drug: naproxen sodium   Take 440 mg by mouth as needed. For pain      amLODipine 10 MG tablet   Commonly known as: NORVASC   Take 10 mg by mouth daily.      butalbital-acetaminophen-caffeine 50-325-40 MG per tablet   Commonly known as: FIORICET, ESGIC   Take 1-2 tablets by mouth every 6 (six) hours as needed for headache.      Green Tea (Camillia sinensis) 250 MG Caps   Take 3 capsules by mouth 2 (two) times daily with a meal.      hydrochlorothiazide 12.5 MG tablet   Commonly known as: HYDRODIURIL   Take 12.5 mg by mouth daily.      losartan 50 MG tablet   Commonly known as: COZAAR   Take 2 tablets (100 mg total) by mouth daily.      tetrahydrozoline-zinc 0.05-0.25 % ophthalmic solution   Commonly known as: VISINE-AC   Place 2 drops into both eyes daily as needed. For allergy eye relief           Discharge Exam: Blood pressure 154/97, pulse 72, temperature 98 F (36.7 C), temperature source Oral, resp. rate 18, height 5\' 4"  (1.626 m), weight 125 kg (275 lb 9.2 oz), last menstrual period 11/19/2011, SpO2 96.00%. NAD CTA B S1, S2, RRR Soft, NT, BS+ No edema b/l Neurologically grossly intact, non focal, no facial asymmetry  Disposition and Follow-up:  Patient will be scheduled to follow up with Dr. [name]  Consults: none   Significant Diagnostic Studies:  Ct Head Wo  Contrast  11/20/2011  *RADIOLOGY REPORT*  Clinical Data: Headache, numbness  CT HEAD WITHOUT CONTRAST  Technique:  Contiguous axial images were obtained from the base of the skull through the vertex without contrast.  Comparison: 05/20/2011  Findings: Normal ventricular morphology. No midline shift or mass effect. Normal appearance of brain parenchyma. No intracranial hemorrhage, mass lesion, or acute infarction. Visualized paranasal sinuses and mastoid air cells clear. Bones unremarkable.  IMPRESSION: No acute intracranial abnormalities.  Original Report Authenticated By: Lollie Marrow, M.D.   Mr [name] Wo Contrast  11/20/2011  *RADIOLOGY REPORT*  Clinical Data: Severe right-sided headache.  Vision loss right eye with right facial numbness.  MRI HEAD WITHOUT AND WITH CONTRAST  Technique:  Multiplanar, multiecho pulse sequences of the brain and surrounding structures were obtained according to standard protocol without and with intravenous contrast  Contrast: 20mL MULTIHANCE GADOBENATE DIMEGLUMINE 529 MG/ML IV SOLN  Comparison: CT head 11/20/2011  Findings: 3 mm tiny hyperintensity in the right medial frontal cortex over the convexity.  This is at the top of the brain and adjacent to ossified falx.  I think this is most likely an artifact rather than acute infarct.  Diffusion otherwise negative for acute infarct.  Several small hyperintensities in the cerebral  white matter bilaterally.  Brainstem and cerebellum are normal.  No hemorrhage or mass lesion.  No edema is present in the brain.  Postcontrast imaging reveals normal enhancement.  No mass lesion is identified.  IMPRESSION: Scattered small white matter hyperintensities bilaterally.  These may be related to migraine headaches or chronic ischemia.  Diffusion weighted imaging reveals a tiny hyperintensity in the right medial frontal lobe over the convexity.  I would favor this is an artifact.  No definite acute infarct.  Original Report Authenticated By:  Camelia Phenes, M.D.    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  No associated limb weakness or parathesiae.   Hospital Course:  This lady was admitted to the hospital with complaints of headache, blurry vision in right eye and right sided facial numbness. She was evaluated with a CT head and an MRI of the brain, both of which did not show any acute findings.  She reports that her headache gets worse with loud noises and bright lights.  She feels that her blurry vision and right sided facial numbness have resolved.  It is likely that this is related to a complex migraine.  Will give her fioricet since it has helped her in the past.  She does have a history of pseudotumor cerebri and has not seen a neurologist in many years.  She is requesting to discharge home today.  Since her imagine does not show any acute findings and she is clinically feeling improved, I think this would be reasonable.  We will set her up to see Dr. [name] in the outpatient setting. She will be discharged home today with recommendations to return to the hospital if her symptoms recur/get worse.  Time spent on Discharge:  Signed: Camil Wilhelmsen [facility] Pager: [phone] 11/21/2011, 1:40 PM

## 2011-11-21 NOTE — Progress Notes (Signed)
Discharge instructions reviewed with patient, patient voiced understanding. Patient given discharge instructions, prescriptions, and work note. Patient in stable condition and transported out by this RN.

## 2011-11-21 NOTE — Progress Notes (Signed)
Patient complains of headache of 5 on 0-10 pain scale. Patient refuses any pain medication because she states it will not help because the construction noise. Patient given noise reduction items which is says helps a little. Patient stated she's just ready to go home. Notified Dr. Kerry Hough.

## 2011-11-21 NOTE — Discharge Instructions (Signed)

## 2011-11-21 NOTE — Progress Notes (Signed)
UR Chart Review Completed  

## 2011-11-26 ENCOUNTER — Other Ambulatory Visit: Payer: Self-pay | Admitting: Neurology

## 2011-11-27 ENCOUNTER — Ambulatory Visit (HOSPITAL_COMMUNITY)
Admission: RE | Admit: 2011-11-27 | Discharge: 2011-11-27 | Disposition: A | Discharge status: Home / Self Care | Payer: PRIVATE HEALTH INSURANCE | Source: Ambulatory Visit | Attending: Neurology | Admitting: Neurology

## 2011-11-27 ENCOUNTER — Inpatient Hospital Stay (HOSPITAL_COMMUNITY): Admission: RE | Admit: 2011-11-27 | Discharge: 2011-11-27 | Payer: PRIVATE HEALTH INSURANCE | Source: Ambulatory Visit

## 2011-11-27 DIAGNOSIS — R51 Headache: Secondary | ICD-10-CM | POA: Insufficient documentation

## 2011-11-27 DIAGNOSIS — G932 Benign intracranial hypertension: Secondary | ICD-10-CM | POA: Insufficient documentation

## 2011-11-27 MED ORDER — ACETAMINOPHEN 500 MG PO TABS
1000.0000 mg | ORAL_TABLET | Freq: Four times a day (QID) | ORAL | Status: DC | PRN
  Administered 2011-11-27: 1000 mg via ORAL

## 2011-11-27 NOTE — Op Note (Addendum)
1320 Notified Dr. Tyron Russell about patient's elevated blood pressure readings. No medications orders given at this time. Told to continue monitoring patient's blood pressure. Told to ask patient about primary doctor. Patient goes to Urgent Care in [place]. Admitted last week to [facility] for elevated blood pressures-medications adjusted at that time.

## 2011-11-27 NOTE — Discharge Instructions (Signed)
Hypertension As your heart beats, it forces blood through your arteries. This force is your blood pressure. If the pressure is too high, it is called hypertension (HTN) or high blood pressure. HTN is dangerous because you may have it and not know it. High blood pressure may mean that your heart has to work harder to pump blood. Your arteries may be narrow or stiff. The extra work puts you at risk for heart disease, stroke, and other problems.  Blood pressure consists of two numbers, a higher number over a lower, 110/72, for example. It is stated as "110 over 72." The ideal is below 120 for the top number (systolic) and under 80 for the bottom (diastolic). Write down your blood pressure today. You should pay close attention to your blood pressure if you have certain conditions such as:  Heart failure.   Prior heart attack.   Diabetes   Chronic kidney disease.   Prior stroke.   Multiple risk factors for heart disease.  To see if you have HTN, your blood pressure should be measured while you are seated with your arm held at the level of the heart. It should be measured at least twice. A one-time elevated blood pressure reading (especially in the Emergency Department) does not mean that you need treatment. There may be conditions in which the blood pressure is different between your right and left arms. It is important to see your caregiver soon for a recheck. Most people have essential hypertension which means that there is not a specific cause. This type of high blood pressure may be lowered by changing lifestyle factors such as:  Stress.   Smoking.   Lack of exercise.   Excessive weight.   Drug/tobacco/alcohol use.   Eating less salt.  Most people do not have symptoms from high blood pressure until it has caused damage to the body. Effective treatment can often prevent, delay or reduce that damage. TREATMENT  When a cause has been identified, treatment for high blood pressure is  directed at the cause. There are a large number of medications to treat HTN. These fall into several categories, and your caregiver will help you select the medicines that are best for you. Medications may have side effects. You should review side effects with your caregiver. If your blood pressure stays high after you have made lifestyle changes or started on medicines,   Your medication(s) may need to be changed.   Other problems may need to be addressed.   Be certain you understand your prescriptions, and know how and when to take your medicine.   Be sure to follow up with your caregiver within the time frame advised (usually within two weeks) to have your blood pressure rechecked and to review your medications.   If you are taking more than one medicine to lower your blood pressure, make sure you know how and at what times they should be taken. Taking two medicines at the same time can result in blood pressure that is too low.  SEEK IMMEDIATE MEDICAL CARE IF:  You develop a severe headache, blurred or changing vision, or confusion.   You have unusual weakness or numbness, or a faint feeling.   You have severe chest or abdominal pain, vomiting, or breathing problems.  MAKE SURE YOU:   Understand these instructions.   Will watch your condition.   Will get help right away if you are not doing well or get worse.  Document Released: 05/20/2005 Document Revised: 05/09/2011 Document  Reviewed: 01/08/2008 [facility] Patient Information 2012 [place], [place].Post Lumbar Puncture You have had a lumbar puncture. This is also called a spinal tap. This test is done by inserting a needle between the bones of the low back. A small amount of the fluid that surrounds the spinal cord is then removed. This fluid is tested for signs of infection or bleeding. This procedure will usually cause some soreness around the puncture site for a day or two. Occasionally there is a headache for 1 to 3 days after a  lumbar puncture. These are made worse by standing and may be associated with dizziness, nausea or vomiting. These symptoms are best relieved by lying flat for several days and drinking extra fluids. Caffeinated beverages (black tea, coffee, or colas) every 6 hours can help reduce the pain. Only take over-the-counter or prescription medicines for pain, discomfort, or fever as directed by your caregiver. If the headache is very severe or your symptoms are not better after 1 to 2 days of treatment, contact your caregiver. An injection called an epidural blood patch can be used to stop the spinal fluid from further leaking and improve the headache. SEEK IMMEDIATE MEDICAL CARE IF:  You develop a fever, chills, or neck stiffness.   You develop a severe headache, dizziness, passing out, or a seizure.   You develop unusual swelling, pain, or redness at the puncture site.   You develop a rash.   You develop a stiff neck with or without a stiff back.   You develop weakness, numbness, or tingling in your legs.  Document Released: 06/27/2004 Document Revised: 05/09/2011 Document Reviewed: 06/27/2008 [facility] Patient Information 2012 [place], [place].

## 2011-11-27 NOTE — OR Nursing (Addendum)
1520 Notified Dr. Tyron Russell of patient's blood pressure continuing to trend downward. Dr. Tyron Russell spoke with patient about blood pressure concerns and coarse of action concerning elevated blood pressure. Last reading blood pressure 157/98 Heart rate 89, O2 96% on Room Air, Respirations 18. Patient stable at this time.

## 2011-11-27 NOTE — Procedures (Signed)
Preprocedure Dx: Pseudotumor cerebri, headaches Postprocedure Dx: Pseudotumor cerebri, headaches Procedure:  Fluoroscopically guided lumbar puncture, therapeutic Radiologist:  Tyron Russell Anesthesia:  2 ml of 1% lidocaine Specimen:  18 ml CSF  EBL:   None Opening pressure: 32 cm H2O Closing pressure: 17 cm H2O Complications: None

## 2011-11-27 NOTE — Progress Notes (Signed)
Patient ID: Annette Deleon, female   DOB: [birth year], 36 y.o.   MRN: [id number]  Patient currently asymptomatic; had a headache which has since resolved. BP has improved but has remained elevated throughout the recovery period. Recently had changes to her BP medications (last week) Discussed with patient the need for her to follow-up with her primary care physician and get her BP under better control. Discussed with [name] RN - patient may be discharged at the designated time and instructed to follow-up with Dr. [name] and her PCP. Gloyd Happ A. Tyron Russell, M.D.

## 2011-11-28 ENCOUNTER — Emergency Department (HOSPITAL_COMMUNITY)
Admission: EM | Admit: 2011-11-28 | Discharge: 2011-11-28 | Disposition: A | Discharge status: Home / Self Care | Payer: PRIVATE HEALTH INSURANCE | Attending: Emergency Medicine | Admitting: Emergency Medicine

## 2011-11-28 ENCOUNTER — Other Ambulatory Visit (HOSPITAL_COMMUNITY): Payer: PRIVATE HEALTH INSURANCE

## 2011-11-28 ENCOUNTER — Ambulatory Visit (HOSPITAL_COMMUNITY): Payer: PRIVATE HEALTH INSURANCE

## 2011-11-28 ENCOUNTER — Encounter (HOSPITAL_COMMUNITY): Payer: Self-pay | Admitting: Emergency Medicine

## 2011-11-28 DIAGNOSIS — G932 Benign intracranial hypertension: Secondary | ICD-10-CM

## 2011-11-28 DIAGNOSIS — Z9089 Acquired absence of other organs: Secondary | ICD-10-CM | POA: Insufficient documentation

## 2011-11-28 DIAGNOSIS — R110 Nausea: Secondary | ICD-10-CM | POA: Insufficient documentation

## 2011-11-28 DIAGNOSIS — I10 Essential (primary) hypertension: Secondary | ICD-10-CM | POA: Insufficient documentation

## 2011-11-28 DIAGNOSIS — R51 Headache: Secondary | ICD-10-CM

## 2011-11-28 MED ORDER — HYDROMORPHONE HCL PF 1 MG/ML IJ SOLN
1.0000 mg | Freq: Once | INTRAMUSCULAR | Status: AC
  Administered 2011-11-28: 1 mg via INTRAVENOUS
  Filled 2011-11-28: qty 1

## 2011-11-28 MED ORDER — FUROSEMIDE 10 MG/ML IJ SOLN
40.0000 mg | INTRAMUSCULAR | Status: AC
  Administered 2011-11-28: 40 mg via INTRAVENOUS
  Filled 2011-11-28: qty 4

## 2011-11-28 MED ORDER — CLONIDINE HCL 0.1 MG/24HR TD PTWK: 0.2000 mg | MEDICATED_PATCH | Freq: Once | TRANSDERMAL | Status: DC

## 2011-11-28 MED ORDER — SODIUM CHLORIDE 0.9 % IV SOLN
Freq: Once | INTRAVENOUS | Status: AC
  Administered 2011-11-28: 05:00:00 via INTRAVENOUS

## 2011-11-28 MED ORDER — SODIUM CHLORIDE 0.9 % IV BOLUS (SEPSIS): 1000.0000 mL | INTRAVENOUS | Status: DC

## 2011-11-28 MED ORDER — CLONIDINE HCL 0.2 MG PO TABS
0.2000 mg | ORAL_TABLET | Freq: Once | ORAL | Status: AC
  Administered 2011-11-28: 0.2 mg via ORAL
  Filled 2011-11-28: qty 1

## 2011-11-28 MED ORDER — ONDANSETRON HCL 4 MG/2ML IJ SOLN
4.0000 mg | Freq: Once | INTRAMUSCULAR | Status: AC
  Administered 2011-11-28: 4 mg via INTRAVENOUS
  Filled 2011-11-28: qty 2

## 2011-11-28 MED ORDER — DIPHENHYDRAMINE HCL 50 MG/ML IJ SOLN
25.0000 mg | Freq: Once | INTRAMUSCULAR | Status: AC
  Administered 2011-11-28: 25 mg via INTRAVENOUS
  Filled 2011-11-28: qty 1

## 2011-11-28 NOTE — ED Notes (Signed)
Patient states had lumbar puncture done yesterday; c/o headache since procedure.

## 2011-11-28 NOTE — Discharge Instructions (Signed)
Call Dr. Ronal Fear office this morning and let them know you were seen in the ER. Let them give you an appointment for follow up. Make an appointment with your primary care doctor to discuss blood pressure management.

## 2011-11-28 NOTE — ED Provider Notes (Signed)
History     CSN: [id number]  Arrival date & time 11/28/11  0451   First MD Initiated Contact with Patient 11/28/11 [phone]      Chief Complaint  Patient presents with  . Headache    (Consider location/radiation/quality/duration/timing/severity/associated sxs/prior treatment) HPI  Annette Deleon is a 36 y.o. female with a h/o HTN, migraines, pseudotumor cerebri, who presents to the Emergency Department complaining of  Worsening right sided headache after having a therapeutic  LP yesterday. Patient reports she had a slight headache before, during and after the LP. Woke up this morning at 3AM with worsening right sided headache.She took aleve with no relief.Patient was recently hospitalized 6/19-6/20 with right sided headache associated with numbness of the face. CT and MRI were negative. She was treated for a complex migraine and hypertension. She has followed up with neurologist, Dr. [name] who ordered the LP. She is seeing PCP in [place] for HTN with recent medication change.    PCP [facility] [place] Neurologist Dr. [name] Past Medical History  Diagnosis Date  . Hypertension   . Pseudotumor cerebri   . Migraines     Past Surgical History  Procedure Date  . Cesarean section   . Cholecystectomy   . Myomectomy   . Knee surgery     right    No family history on file.  History  Substance Use Topics  . Smoking status: Never Smoker   . Smokeless tobacco: Not on file  . Alcohol Use: Yes     occasionally    OB History    Grav Para Term Preterm Abortions TAB SAB Ect Mult Living                  Review of Systems  Constitutional: Negative for fever.       10 Systems reviewed and are negative for acute change except as noted in the HPI.  HENT: Negative for congestion.   Eyes: Negative for discharge and redness.  Respiratory: Negative for cough and shortness of breath.   Cardiovascular: Negative for chest pain.  Gastrointestinal: Negative for vomiting and  abdominal pain.  Musculoskeletal: Negative for back pain.  Skin: Negative for rash.  Neurological: Positive for headaches. Negative for syncope and numbness.  Psychiatric/Behavioral:       No behavior change.    Allergies  Imitrex; Yellow jacket venom; and Ibuprofen  Home Medications   Current Outpatient Rx  Name Route Sig Dispense Refill  . ACETAMINOPHEN 500 MG PO TABS Oral Take 1,000-1,500 mg by mouth every 6 (six) hours as needed. Pain    . AMLODIPINE BESYLATE 10 MG PO TABS Oral Take 10 mg by mouth daily.      Marland Kitchen BUTALBITAL-APAP-CAFFEINE 50-325-40 MG PO TABS Oral Take 1-2 tablets by mouth every 6 (six) hours as needed for headache. 20 tablet 0  . HYDROCHLOROTHIAZIDE 12.5 MG PO TABS Oral Take 12.5 mg by mouth daily.      Marland Kitchen LOSARTAN POTASSIUM 50 MG PO TABS Oral Take 2 tablets (100 mg total) by mouth daily. 60 tablet 1  . NAPROXEN SODIUM 220 MG PO TABS Oral Take 440 mg by mouth as needed. For pain    . TETRAHYDROZOLINE-ZN SULFATE 0.05-0.25 % OP SOLN Both Eyes Place 2 drops into both eyes daily as needed. For allergy eye relief    . GREEN TEA (CAMILLIA SINENSIS) 250 MG PO CAPS Oral Take 3 capsules by mouth 2 (two) times daily with a meal.      BP  197/121  Pulse 91  Temp 98.4 F (36.9 C) (Oral)  Resp 20  Ht 5\' 4"  (1.626 m)  Wt 246 lb (111.585 kg)  BMI 42.23 kg/m2  SpO2 99%  LMP 11/19/2011  Physical Exam  Nursing note and vitals reviewed. Constitutional:       Awake, alert, nontoxic appearance.  HENT:  Head: Atraumatic.  Eyes: Right eye exhibits no discharge. Left eye exhibits no discharge.  Neck: Neck supple.  Pulmonary/Chest: Effort normal. She exhibits no tenderness.  Abdominal: Soft. There is no tenderness. There is no rebound.  Musculoskeletal: She exhibits no tenderness.       Baseline ROM, no obvious new focal weakness.  Neurological:       Mental status and motor strength appears baseline for patient and situation.The patient is alert and oriented x 3. Speech is  fluent. Repetition and comprehension are intact.CRANIAL NERVES: I-not tested. II-Pupils are 3+ and 3+,Visual fields are full. III,IV,VI-Extraocular movements are intact. V-Facial movement is symmetric.VIII-Intact bilaterally to finger rub. IX,X-Gag is present bilaterally and the palate elevates in the midline.XI-Sternocleidomastoid and trapezius are 5/5. XII-Tongue is in the midline with no fasciculations or atrophy. MOTOR EXAM; Neck flexion is 4/5, neck extension 5/5. Deltoids are 4/5 bilaterally,biceps 5/5 bilaterally, triceps 4/5 bilaterally, wrist extensors 4/5 on the right, 3/5 on the left, wrist flexors 4/5 bilaterally, Finger extensors 3/ bilaterally, finger flexors 3/5 bilaterally. Iliopsoas is 4/5 bilaterally,knee extensors 5/5 bilaterally, knee flexors 5/5 bilaterally, ankle dorsiflexors 4/5 bilaterally, ankle plantar flexors 5/5 bilaterally, toe extensors 4/5 bilaterally. DEEP TENDON REFLEXES: 3 in the upper extremities throughout,3 at the knees,3 at the ankles.SENSORY: Normal to sharp, proprioception,. Romberg is negative. No jaw jerk or Hoffman's sign. Finger-to-nose is intact.CEREBELLAR:Rapid alternating movements are intact. GAIT: Normal and steady.    Skin: No rash noted.  Psychiatric: She has a normal mood and affect.       ED Course  Procedures (including critical care time) Results for orders placed during the hospital encounter of 11/20/11  CBC      Component Value Range   WBC 8.6  4.0 - 10.5 K/uL   RBC 4.92  3.87 - 5.11 MIL/uL   Hemoglobin 13.3  12.0 - 15.0 g/dL   HCT 16.1  09.6 - 04.5 %   MCV 81.3  78.0 - 100.0 fL   MCH 27.0  26.0 - 34.0 pg   MCHC 33.3  30.0 - 36.0 g/dL   RDW 40.9  81.1 - 91.4 %   Platelets 410 (*) 150 - 400 K/uL  DIFFERENTIAL      Component Value Range   Neutrophils Relative 68  43 - 77 %   Neutro Abs 5.9  1.7 - 7.7 K/uL   Lymphocytes Relative 24  12 - 46 %   Lymphs Abs 2.0  0.7 - 4.0 K/uL   Monocytes Relative 6  3 - 12 %   Monocytes Absolute 0.5   0.1 - 1.0 K/uL   Eosinophils Relative 2  0 - 5 %   Eosinophils Absolute 0.1  0.0 - 0.7 K/uL   Basophils Relative 1  0 - 1 %   Basophils Absolute 0.1  0.0 - 0.1 K/uL  BASIC METABOLIC PANEL      Component Value Range   Sodium 136  135 - 145 mEq/L   Potassium 3.6  3.5 - 5.1 mEq/L   Chloride 98  96 - 112 mEq/L   CO2 27  19 - 32 mEq/L   Glucose, Bld 103 (*) 70 - 99  mg/dL   BUN 14  6 - 23 mg/dL   Creatinine, Ser 0.10  0.50 - 1.10 mg/dL   Calcium 27.2  8.4 - 53.6 mg/dL   GFR calc non Af Amer 85 (*) >90 mL/min   GFR calc Af Amer >90  >90 mL/min  COMPREHENSIVE METABOLIC PANEL      Component Value Range   Sodium 138  135 - 145 mEq/L   Potassium 3.4 (*) 3.5 - 5.1 mEq/L   Chloride 102  96 - 112 mEq/L   CO2 27  19 - 32 mEq/L   Glucose, Bld 115 (*) 70 - 99 mg/dL   BUN 12  6 - 23 mg/dL   Creatinine, Ser 6.44  0.50 - 1.10 mg/dL   Calcium 9.3  8.4 - 03.4 mg/dL   Total Protein 7.0  6.0 - 8.3 g/dL   Albumin 3.2 (*) 3.5 - 5.2 g/dL   AST 11  0 - 37 U/L   ALT 12  0 - 35 U/L   Alkaline Phosphatase 82  39 - 117 U/L   Total Bilirubin 0.1 (*) 0.3 - 1.2 mg/dL   GFR calc non Af Amer 88 (*) >90 mL/min   GFR calc Af Amer >90  >90 mL/min  CBC      Component Value Range   WBC 7.3  4.0 - 10.5 K/uL   RBC 4.45  3.87 - 5.11 MIL/uL   Hemoglobin 12.2  12.0 - 15.0 g/dL   HCT 74.2  59.5 - 63.8 %   MCV 82.0  78.0 - 100.0 fL   MCH 27.4  26.0 - 34.0 pg   MCHC 33.4  30.0 - 36.0 g/dL   RDW 75.6  43.3 - 29.5 %   Platelets 374  150 - 400 K/uL   Reviewed fluoro guided LP.  Dg Fluoro Guide Lumbar Puncture  11/27/2011  *RADIOLOGY REPORT*  Clinical Data: Pseudotumor cerebri, headaches  FLUOROSCOPIC GUIDED THERAPEUTIC LUMBAR PUNCTURE:  Technique: Written informed consent for fluoroscopic-guided lumbar puncture was obtained. Patient was placed prone. L4-L5 disc space was localized under fluoroscopy. Skin prepped and draped in usual sterile fashion. Skin and soft tissues anesthetized with 2 ml of 1% lidocaine. 22 gauge  spinal needle advanced into spinal canal. Clear colorless CSF was encountered with opening pressure of 32 cm H2O. 18 ml of CSF was obtained in four tubes as therapy for pseudotumor cerebri. Closing pressure 17 cm H2O. Procedure tolerated well by patient without immediate complication.  Fluoroscopy time: 0.4 minutes  IMPRESSION: Fluoroscopic guided therapeutic lumbar puncture as above.  Original Report Authenticated By: Lollie Marrow, M.D.    [phone] Nausea has been relieved. Headache improvedNow 5/10 from 10/10.  0615 Blood pressure has improved. Nausea relieved. Headache remains 5/10. Additional analgesic ordered.  MDM  Patient with h/o headaches, pseudotumor cerebri, hypertension here with worsening headache after therapeutic LP yesterday.Given analgesic, antiemetic, lasix, clonidine with improvement in nausea, headache and blood pressure.  Pt feels improved after observation and/or treatment in ED.Pt stable in ED with no significant deterioration in condition.The patient appears reasonably screened and/or stabilized for discharge and I doubt any other medical condition or other [facility] requiring further screening, evaluation, or treatment in the ED at this time prior to discharge.  MDM Reviewed: nursing note, vitals and previous chart Reviewed previous: MRI, CT scan, ultrasound and labs Interpretation: labs           [facility]. Colon Branch, MD 11/28/11 [phone]

## 2012-02-12 DIAGNOSIS — R112 Nausea with vomiting, unspecified: Secondary | ICD-10-CM

## 2012-04-20 ENCOUNTER — Emergency Department (HOSPITAL_COMMUNITY)
Admission: EM | Admit: 2012-04-20 | Discharge: 2012-04-20 | Disposition: A | Discharge status: Home / Self Care | Payer: PRIVATE HEALTH INSURANCE | Attending: Emergency Medicine | Admitting: Emergency Medicine

## 2012-04-20 ENCOUNTER — Encounter (HOSPITAL_COMMUNITY): Payer: Self-pay

## 2012-04-20 DIAGNOSIS — G43909 Migraine, unspecified, not intractable, without status migrainosus: Secondary | ICD-10-CM | POA: Insufficient documentation

## 2012-04-20 DIAGNOSIS — R112 Nausea with vomiting, unspecified: Secondary | ICD-10-CM | POA: Insufficient documentation

## 2012-04-20 DIAGNOSIS — H53149 Visual discomfort, unspecified: Secondary | ICD-10-CM | POA: Insufficient documentation

## 2012-04-20 DIAGNOSIS — Z79899 Other long term (current) drug therapy: Secondary | ICD-10-CM | POA: Insufficient documentation

## 2012-04-20 DIAGNOSIS — I10 Essential (primary) hypertension: Secondary | ICD-10-CM | POA: Insufficient documentation

## 2012-04-20 DIAGNOSIS — G932 Benign intracranial hypertension: Secondary | ICD-10-CM | POA: Insufficient documentation

## 2012-04-20 LAB — GLUCOSE, CAPILLARY: Glucose-Capillary: 83 mg/dL (ref 70–99)

## 2012-04-20 MED ORDER — KETOROLAC TROMETHAMINE 30 MG/ML IJ SOLN
30.0000 mg | Freq: Once | INTRAMUSCULAR | Status: AC
  Administered 2012-04-20: 30 mg via INTRAVENOUS

## 2012-04-20 MED ORDER — METOCLOPRAMIDE HCL 5 MG/ML IJ SOLN
INTRAMUSCULAR | Status: AC
  Administered 2012-04-20: 10 mg via INTRAVENOUS
  Filled 2012-04-20: qty 2

## 2012-04-20 MED ORDER — DIPHENHYDRAMINE HCL 50 MG/ML IJ SOLN
25.0000 mg | Freq: Once | INTRAMUSCULAR | Status: AC
  Administered 2012-04-20: 25 mg via INTRAVENOUS

## 2012-04-20 MED ORDER — SODIUM CHLORIDE 0.9 % IV SOLN
INTRAVENOUS | Status: DC
  Administered 2012-04-20: 20 mL/h via INTRAVENOUS

## 2012-04-20 MED ORDER — KETOROLAC TROMETHAMINE 30 MG/ML IJ SOLN
INTRAMUSCULAR | Status: AC
  Administered 2012-04-20: 30 mg via INTRAVENOUS
  Filled 2012-04-20: qty 2

## 2012-04-20 MED ORDER — HYDROMORPHONE HCL PF 1 MG/ML IJ SOLN
1.0000 mg | Freq: Once | INTRAMUSCULAR | Status: AC
  Administered 2012-04-20: 1 mg via INTRAVENOUS

## 2012-04-20 MED ORDER — HYDROMORPHONE HCL PF 1 MG/ML IJ SOLN
INTRAMUSCULAR | Status: AC
  Administered 2012-04-20: 1 mg via INTRAVENOUS
  Filled 2012-04-20: qty 1

## 2012-04-20 MED ORDER — PROMETHAZINE HCL 25 MG PO TABS: 25.0000 mg | ORAL_TABLET | Freq: Four times a day (QID) | ORAL | Status: DC | PRN

## 2012-04-20 MED ORDER — METOCLOPRAMIDE HCL 5 MG/ML IJ SOLN
10.0000 mg | Freq: Once | INTRAMUSCULAR | Status: AC
  Administered 2012-04-20: 10 mg via INTRAVENOUS

## 2012-04-20 MED ORDER — OXYCODONE-ACETAMINOPHEN 5-325 MG PO TABS: 1.0000 | ORAL_TABLET | Freq: Four times a day (QID) | ORAL | Status: DC | PRN

## 2012-04-20 MED ORDER — DIPHENHYDRAMINE HCL 50 MG/ML IJ SOLN
INTRAMUSCULAR | Status: AC
  Administered 2012-04-20: 25 mg via INTRAVENOUS
  Filled 2012-04-20: qty 1

## 2012-04-20 NOTE — ED Notes (Signed)
Patient with no complaints at this time. Respirations even and unlabored. Skin warm/dry. Discharge instructions reviewed with patient at this time. Patient given opportunity to voice concerns/ask questions. IV removed per policy and band-aid applied to site. Patient discharged at this time and left Emergency Department with steady gait.  

## 2012-04-20 NOTE — ED Notes (Signed)
Patient broke out into a sweat and couldn't catch her breath. RN made aware and assisted patient with calming down. Blood sugar was 89. RN made aware.

## 2012-04-20 NOTE — ED Provider Notes (Signed)
History     CSN: [id number]  Arrival date & time 04/20/12  1112   First MD Initiated Contact with Patient 04/20/12 1258      Chief Complaint  Patient presents with  . Headache    (Consider location/radiation/quality/duration/timing/severity/associated sxs/prior treatment) HPI Comments: Patient with history of pseudotumor cerebri followed by Dr. [name].  Present here with headache that started at 10 AM.  No injury or trauma.  She denies visual complaints but does report that her face feels numb.  Had mri in July showing white matter changes compatible with migraines or htn.  Patient is a 36 y.o. female presenting with headaches. The history is provided by the patient.  Headache  This is a recurrent problem. Episode onset: 10 AM. The problem occurs constantly. The problem has been rapidly worsening. The headache is associated with bright light. The pain is located in the parietal region. The quality of the pain is described as throbbing. The pain is severe. The pain does not radiate. Associated symptoms include nausea and vomiting. She has tried NSAIDs for the symptoms. The treatment provided no relief.    Past Medical History  Diagnosis Date  . Hypertension   . Pseudotumor cerebri   . Migraines     Past Surgical History  Procedure Date  . Cesarean section   . Cholecystectomy   . Myomectomy   . Knee surgery     right    No family history on file.  History  Substance Use Topics  . Smoking status: Never Smoker   . Smokeless tobacco: Not on file  . Alcohol Use: Yes     Comment: occasionally    OB History    Grav Para Term Preterm Abortions TAB SAB Ect Mult Living                  Review of Systems  Gastrointestinal: Positive for nausea and vomiting.  Neurological: Positive for headaches.  All other systems reviewed and are negative.    Allergies  Imitrex; Yellow jacket venom; and Ibuprofen  Home Medications   Current Outpatient Rx  Name  Route  Sig   Dispense  Refill  . ACETAMINOPHEN 500 MG PO TABS   Oral   Take 1,000-1,500 mg by mouth every 6 (six) hours as needed. Pain         . AMLODIPINE BESYLATE 10 MG PO TABS   Oral   Take 10 mg by mouth daily.           Marland Kitchen HYDROCHLOROTHIAZIDE 12.5 MG PO TABS   Oral   Take 12.5 mg by mouth daily.           Marland Kitchen LOSARTAN POTASSIUM 50 MG PO TABS   Oral   Take 50 mg by mouth 2 (two) times daily.         Marland Kitchen NAPROXEN SODIUM 220 MG PO TABS   Oral   Take 440 mg by mouth as needed. For pain         . TAMSULOSIN HCL 0.4 MG PO CAPS   Oral   Take 0.4 mg by mouth daily.           BP 171/109  Pulse 79  Temp 98.4 F (36.9 C) (Oral)  Resp 22  Ht 5\' 3"  (1.6 m)  Wt 246 lb (111.585 kg)  BMI 43.58 kg/m2  SpO2 100%  LMP 03/22/2012  Physical Exam  Nursing note and vitals reviewed. Constitutional: She is oriented to person, place, and time. She appears  well-developed and well-nourished. No distress.  HENT:  Head: Normocephalic and atraumatic.  Mouth/Throat: Oropharynx is clear and moist.  Eyes: EOM are normal. Pupils are equal, round, and reactive to light.       There is no papilledema on fundoscopic exam.    Neck: Normal range of motion. Neck supple.  Cardiovascular: Normal rate and regular rhythm.   No murmur heard. Pulmonary/Chest: Effort normal and breath sounds normal.  Abdominal: Soft. Bowel sounds are normal.  Musculoskeletal: Normal range of motion. She exhibits no edema.  Lymphadenopathy:    She has no cervical adenopathy.  Neurological: She is alert and oriented to person, place, and time. No cranial nerve deficit. She exhibits normal muscle tone. Coordination normal.  Skin: Skin is warm and dry. She is not diaphoretic.    ED Course  Procedures (including critical care time)   Labs Reviewed  GLUCOSE, CAPILLARY   No results found.   No diagnosis found.    MDM  The patient feels better with meds given.  She will be given medications for nausea and pain and  is advised to follow up with Dr. [name] to discuss therapeutic LP.  Doubt meningitis, no indication for LP or ct at this time.          Geoffery Lyons, MD 04/20/12 1500

## 2012-04-20 NOTE — ED Notes (Signed)
Had sudden onset severe 10/10 HA this a.m.  Felt "like I was going to fall out."  Associated w/L facial numbness.  No facial droop noted or speech slurring.  Took double her antihypertensive doses this morning b/c she felt her BP was up.

## 2012-04-20 NOTE — ED Notes (Signed)
Patient is resting comfortably. 

## 2012-04-20 NOTE — ED Notes (Signed)
Complain of headache and nausea this morning. Pt stated the left side of her face felt numb. Pt hypervent.

## 2013-05-11 ENCOUNTER — Emergency Department (HOSPITAL_COMMUNITY)
Admission: EM | Admit: 2013-05-11 | Discharge: 2013-05-11 | Disposition: A | Discharge status: Home / Self Care | Payer: No Typology Code available for payment source | Attending: Emergency Medicine | Admitting: Emergency Medicine

## 2013-05-11 ENCOUNTER — Emergency Department (HOSPITAL_COMMUNITY): Payer: No Typology Code available for payment source

## 2013-05-11 ENCOUNTER — Encounter (HOSPITAL_COMMUNITY): Payer: Self-pay | Admitting: Emergency Medicine

## 2013-05-11 DIAGNOSIS — R42 Dizziness and giddiness: Secondary | ICD-10-CM | POA: Insufficient documentation

## 2013-05-11 DIAGNOSIS — R51 Headache: Secondary | ICD-10-CM | POA: Insufficient documentation

## 2013-05-11 DIAGNOSIS — I10 Essential (primary) hypertension: Secondary | ICD-10-CM | POA: Insufficient documentation

## 2013-05-11 DIAGNOSIS — Z79899 Other long term (current) drug therapy: Secondary | ICD-10-CM | POA: Insufficient documentation

## 2013-05-11 DIAGNOSIS — Z8669 Personal history of other diseases of the nervous system and sense organs: Secondary | ICD-10-CM | POA: Insufficient documentation

## 2013-05-11 DIAGNOSIS — R259 Unspecified abnormal involuntary movements: Secondary | ICD-10-CM | POA: Insufficient documentation

## 2013-05-11 DIAGNOSIS — R110 Nausea: Secondary | ICD-10-CM | POA: Insufficient documentation

## 2013-05-11 LAB — POCT I-STAT, CHEM 8
BUN: 14 mg/dL (ref 6–23)
Chloride: 104 mEq/L (ref 96–112)
Creatinine, Ser: 1.1 mg/dL (ref 0.50–1.10)
Potassium: 3.7 mEq/L (ref 3.5–5.1)
Sodium: 143 mEq/L (ref 135–145)
TCO2: 25 mmol/L (ref 0–100)

## 2013-05-11 MED ORDER — ONDANSETRON HCL 4 MG/2ML IJ SOLN
INTRAMUSCULAR | Status: AC
  Administered 2013-05-11: 4 mg via INTRAVENOUS
  Filled 2013-05-11: qty 2

## 2013-05-11 MED ORDER — HYDROMORPHONE HCL PF 1 MG/ML IJ SOLN
1.0000 mg | Freq: Once | INTRAMUSCULAR | Status: AC
  Administered 2013-05-11: 1 mg via INTRAVENOUS

## 2013-05-11 MED ORDER — LABETALOL HCL 5 MG/ML IV SOLN
20.0000 mg | Freq: Once | INTRAVENOUS | Status: AC
  Administered 2013-05-11: 20 mg via INTRAVENOUS
  Filled 2013-05-11: qty 4

## 2013-05-11 MED ORDER — HYDROMORPHONE HCL PF 1 MG/ML IJ SOLN
INTRAMUSCULAR | Status: AC
  Administered 2013-05-11: 1 mg via INTRAVENOUS
  Filled 2013-05-11: qty 1

## 2013-05-11 MED ORDER — ONDANSETRON HCL 4 MG/2ML IJ SOLN
4.0000 mg | Freq: Once | INTRAMUSCULAR | Status: AC
  Administered 2013-05-11: 4 mg via INTRAVENOUS

## 2013-05-11 NOTE — ED Notes (Signed)
Pt given d/c instructions.  Waiting on boyfriend to pick her up.

## 2013-05-11 NOTE — Procedures (Signed)
Preprocedure Dx: Pseudotumor cerebri, headache Postprocedure Dx: Pseudotumor cerebri, headache Procedure:  Fluoroscopically guided lumbar puncture Radiologist:  Tyron Russell Anesthesia:  2 ml of 1% lidocaine Specimen:  8 ml CSF, clear colorless EBL:   None Opening pressure: 12 cm H2O Complications: None

## 2013-05-11 NOTE — ED Provider Notes (Signed)
CSN: [id number]     Arrival date & time 05/11/13  4696 History  This chart was scribed for Hilario Quarry, MD by Ronal Fear, ED Scribe. This patient was seen in room APA03/APA03 and the patient's care was started at 10:27 AM.   Chief Complaint  Patient presents with  . Headache   (Consider location/radiation/quality/duration/timing/severity/associated sxs/prior Treatment) Patient is a 37 y.o. female presenting with headaches. The history is provided by the patient. No language interpreter was used.  Headache Pain location:  Generalized Quality:  Dull Radiates to:  Does not radiate Severity currently:  Unable to specify Onset quality:  Gradual Duration:  5 days Timing:  Constant Progression:  Unchanged Chronicity:  Chronic Relieved by:  Nothing Worsened by:  Nothing tried Ineffective treatments:  Aspirin, NSAIDs and prescription medications Associated symptoms: dizziness and nausea   Associated symptoms: no back pain, no facial pain, no fever, no focal weakness, no loss of balance, no neck pain, no neck stiffness, no numbness, no photophobia, no sinus pressure, no tingling, no vomiting and no weakness   HPI Comments: KANIJAH GROSECLOSE is a 37 y.o. female with a hx of pseudotumor cerebri who presents to the Emergency Department complaining of HA for 5x days with associated dizziness for 3x days. She states that she never experiences dizziness with her cerebri. She takes losartan 2x a day as prescribed for her condition and has also takentylenol and ibuprofen for the HA with no relief. Pt states that her BP is typically high at baseline. She typically gets an LP for her cerebri. She is a Engineer, civil (consulting) by trade. Pt states that a few days ago after putting down her groceries, she had a tremor in her left hand that would not stop. The tremor has resolved. She does not appear to be in any acute distress with no other complaints.   Past Medical History  Diagnosis Date  . Hypertension   . Pseudotumor  cerebri   . Migraines    Past Surgical History  Procedure Laterality Date  . Cesarean section    . Cholecystectomy    . Myomectomy    . Knee surgery      right   No family history on file. History  Substance Use Topics  . Smoking status: Never Smoker   . Smokeless tobacco: Not on file  . Alcohol Use: Yes     Comment: occasionally   OB History   Grav Para Term Preterm Abortions TAB SAB Ect Mult Living                 Review of Systems  Constitutional: Negative for fever and chills.  HENT: Negative for sinus pressure.   Eyes: Negative for photophobia.  Gastrointestinal: Positive for nausea. Negative for vomiting.  Musculoskeletal: Negative for back pain, neck pain and neck stiffness.  Neurological: Positive for dizziness and headaches. Negative for focal weakness, numbness and loss of balance.  All other systems reviewed and are negative.    Allergies  Imitrex; Yellow jacket venom; and Ibuprofen  Home Medications   Current Outpatient Rx  Name  Route  Sig  Dispense  Refill  . losartan-hydrochlorothiazide (HYZAAR) 100-25 MG per tablet   Oral   Take 1 tablet by mouth 2 (two) times daily.         Marland Kitchen acetaminophen (TYLENOL) 500 MG tablet   Oral   Take 1,000-1,500 mg by mouth every 6 (six) hours as needed. Pain         . amLODipine (  NORVASC) 10 MG tablet   Oral   Take 10 mg by mouth daily.           . hydrochlorothiazide (HYDRODIURIL) 12.5 MG tablet   Oral   Take 12.5 mg by mouth daily.           Marland Kitchen losartan (COZAAR) 50 MG tablet   Oral   Take 50 mg by mouth 2 (two) times daily.         . naproxen sodium (ALEVE) 220 MG tablet   Oral   Take 440 mg by mouth as needed. For pain         . oxyCODONE-acetaminophen (PERCOCET/ROXICET) 5-325 MG per tablet   Oral   Take 1-2 tablets by mouth every 6 (six) hours as needed for pain.   10 tablet   0   . promethazine (PHENERGAN) 25 MG tablet   Oral   Take 1 tablet (25 mg total) by mouth every 6 (six) hours  as needed for nausea.   10 tablet   0   . Tamsulosin HCl (FLOMAX) 0.4 MG CAPS   Oral   Take 0.4 mg by mouth daily.          BP 221/120  Pulse 125  Temp(Src) 98.2 F (36.8 C) (Oral)  Resp 20  Ht 5\' 3"  (1.6 m)  Wt 194 lb (87.998 kg)  BMI 34.37 kg/m2  SpO2 100%  LMP 05/02/2013 Physical Exam  Nursing note and vitals reviewed. Constitutional: She is oriented to person, place, and time. She appears well-developed and well-nourished.  HENT:  Head: Normocephalic and atraumatic.  Right Ear: External ear normal.  Left Ear: External ear normal.  Nose: Nose normal.  Mouth/Throat: Oropharynx is clear and moist.  Eyes: Conjunctivae and EOM are normal. Pupils are equal, round, and reactive to light.  Neck: Normal range of motion. Neck supple. No JVD present. No tracheal deviation present. No thyromegaly present.  Cardiovascular: Normal rate, regular rhythm, normal heart sounds and intact distal pulses.   Pulmonary/Chest: Effort normal and breath sounds normal. No respiratory distress. She has no wheezes.  Abdominal: Soft. Bowel sounds are normal. She exhibits no mass. There is no tenderness. There is no guarding.  Musculoskeletal: Normal range of motion.  Lymphadenopathy:    She has no cervical adenopathy.  Neurological: She is alert and oriented to person, place, and time. She has normal reflexes. No cranial nerve deficit or sensory deficit. Gait normal. GCS eye subscore is 4. GCS verbal subscore is 5. GCS motor subscore is 6.  Reflex Scores:      Bicep reflexes are 2+ on the right side and 2+ on the left side.      Patellar reflexes are 2+ on the right side and 2+ on the left side. Strength is 5/5 bilateral elbow flexor/extensors, wrist extension/flexion, intrinsic hand strength equal Bilateral hip flexion/extension 5/5, knee flexion/extension 5/5, ankle 5/5 flexion extension. Left facial droop in her mouth    Skin: Skin is warm and dry.  Psychiatric: She has a normal mood and  affect. Her behavior is normal. Judgment and thought content normal.    ED Course  Procedures (including critical care time) DIAGNOSTIC STUDIES: Oxygen Saturation is 100% on RA, normal by my interpretation.    COORDINATION OF CARE: 10:34 AM- Pt advised of plan for treatment including head CT, labetalol injection, and l-stat and and pt agrees.       Labs Review Labs Reviewed - No data to display Imaging Review No results found.  EKG Interpretation   None       MDM  No diagnosis found. 37 year old obese female history of hypertension and pseudotumor cerebri who presents today with headache consistent with her migraines and with her pseudotumor cerebri. She received IV labetalol and blood pressure has been decreasing from 220 down to 170. She had an LP performed under fluoroscopy by radiology. Opening pressure was 12 and he only removed 8 ML fluid. Her headache has decreased down to a 5/10. It is unclear if the headache was causing the hypertension or vice versa. She has been continuing to take her antihypertensives and is advised to return if she is worse at any time. She was noted to have some facial asymmetry on exam but this does not appear to be clinically significant.  I personally performed the services described in this documentation, which was scribed in my presence. The recorded information has been reviewed and considered.    Hilario Quarry, MD 05/11/13 [phone]

## 2013-05-11 NOTE — ED Notes (Signed)
Pt c/o headache, dizziness with nausea that started 5 days ago, bp 221/120 in triage,

## 2013-05-11 NOTE — ED Notes (Signed)
CSF specimens found in bin. Verified with Dr Blinda Leatherwood, orders to discard. Did not need tests due to only drawing off to reduce headache from increased CSF

## 2013-05-11 NOTE — ED Notes (Signed)
Pt has been discharge waiting ride

## 2014-01-30 ENCOUNTER — Encounter (HOSPITAL_COMMUNITY): Payer: Self-pay | Admitting: Emergency Medicine

## 2014-01-30 ENCOUNTER — Emergency Department (HOSPITAL_COMMUNITY)
Admission: EM | Admit: 2014-01-30 | Discharge: 2014-01-30 | Disposition: A | Discharge status: Home / Self Care | Payer: BC Managed Care – PPO | Attending: Emergency Medicine | Admitting: Emergency Medicine

## 2014-01-30 ENCOUNTER — Emergency Department (HOSPITAL_COMMUNITY): Payer: BC Managed Care – PPO

## 2014-01-30 DIAGNOSIS — G43909 Migraine, unspecified, not intractable, without status migrainosus: Secondary | ICD-10-CM | POA: Diagnosis not present

## 2014-01-30 DIAGNOSIS — R51 Headache: Secondary | ICD-10-CM

## 2014-01-30 DIAGNOSIS — I10 Essential (primary) hypertension: Secondary | ICD-10-CM | POA: Diagnosis not present

## 2014-01-30 DIAGNOSIS — Z79899 Other long term (current) drug therapy: Secondary | ICD-10-CM | POA: Diagnosis not present

## 2014-01-30 DIAGNOSIS — Z880 Allergy status to penicillin: Secondary | ICD-10-CM | POA: Diagnosis not present

## 2014-01-30 DIAGNOSIS — R110 Nausea: Secondary | ICD-10-CM | POA: Insufficient documentation

## 2014-01-30 DIAGNOSIS — G8929 Other chronic pain: Secondary | ICD-10-CM

## 2014-01-30 MED ORDER — HYDROMORPHONE HCL PF 2 MG/ML IJ SOLN
2.0000 mg | Freq: Once | INTRAMUSCULAR | Status: AC
  Administered 2014-01-30: 2 mg via INTRAMUSCULAR
  Filled 2014-01-30: qty 1

## 2014-01-30 MED ORDER — PROMETHAZINE HCL 25 MG/ML IJ SOLN
25.0000 mg | Freq: Once | INTRAMUSCULAR | Status: AC
  Administered 2014-01-30: 25 mg via INTRAMUSCULAR
  Filled 2014-01-30: qty 1

## 2014-01-30 MED ORDER — OXYCODONE-ACETAMINOPHEN 5-325 MG PO TABS: ORAL_TABLET | ORAL | Status: DC

## 2014-01-30 MED ORDER — PROMETHAZINE HCL 25 MG PO TABS: 25.0000 mg | ORAL_TABLET | Freq: Four times a day (QID) | ORAL | Status: DC | PRN

## 2014-01-30 NOTE — ED Provider Notes (Signed)
CSN: [id number]     Arrival date & time 01/30/14  1708 History   First MD Initiated Contact with Patient 01/30/14 1720     Chief Complaint  Patient presents with  . Migraine      HPI Pt was seen at 1725. Per pt, c/o gradual onset and persistence of constant acute flair of her chronic migraine headache since this morning. Has been associated with nausea. Describes the headache as per her usual chronic migraine headache pain pattern for many years. States she has this similar headache "at least 2 or 3 times a week, every week." States her Neuro Dr. Merlene Laughter has tried dosing her with imitrex (she had allergic reaction) and topamax ("it just didn't work") for her headaches. Denies headache was sudden or maximal in onset or at any time.  Denies visual changes, no focal motor weakness, no tingling/numbness in extremities, no fevers, no neck pain, no rash.      Past Medical History  Diagnosis Date  . Hypertension   . Pseudotumor cerebri   . Migraines    Past Surgical History  Procedure Laterality Date  . Cesarean section    . Cholecystectomy    . Myomectomy    . Knee surgery      right    History  Substance Use Topics  . Smoking status: Never Smoker   . Smokeless tobacco: Not on file  . Alcohol Use: Yes     Comment: occasionally    Review of Systems ROS: Statement: All systems negative except as marked or noted in the HPI; Constitutional: Negative for fever and chills. ; ; Eyes: Negative for eye pain, redness and discharge. ; ; ENMT: Negative for ear pain, hoarseness, nasal congestion, sinus pressure and sore throat. ; ; Cardiovascular: Negative for chest pain, palpitations, diaphoresis, dyspnea and peripheral edema. ; ; Respiratory: Negative for cough, wheezing and stridor. ; ; Gastrointestinal: +nausea. Negative for vomiting, diarrhea, abdominal pain, blood in stool, hematemesis, jaundice and rectal bleeding. . ; ; Genitourinary: Negative for dysuria, flank pain and hematuria. ; ;  Musculoskeletal: Negative for back pain and neck pain. Negative for swelling and trauma.; ; Skin: Negative for pruritus, rash, abrasions, blisters, bruising and skin lesion.; ; Neuro: +chronic headache. Negative for lightheadedness and neck stiffness. Negative for weakness, altered level of consciousness , altered mental status, extremity weakness, paresthesias, involuntary movement, seizure and syncope.      Allergies  Imitrex; Yellow jacket venom; Ibuprofen; and Penicillins  Home Medications   Prior to Admission medications   Medication Sig Start Date End Date Taking? Authorizing Provider  acetaminophen (TYLENOL) 500 MG tablet Take 1,000-1,500 mg by mouth every 6 (six) hours as needed. Pain   Yes Historical Provider, MD  amLODipine (NORVASC) 10 MG tablet Take 10 mg by mouth daily.     Yes Historical Provider, MD  ibuprofen (ADVIL,MOTRIN) 200 MG tablet Take 600 mg by mouth every 6 (six) hours as needed.   Yes Historical Provider, MD  losartan-hydrochlorothiazide (HYZAAR) 100-25 MG per tablet Take 1 tablet by mouth 2 (two) times daily.   Yes Historical Provider, MD  naproxen sodium (ALEVE) 220 MG tablet Take 440 mg by mouth as needed. For pain   Yes Historical Provider, MD   BP 175/101  Pulse 77  Temp(Src) 98.2 F (36.8 C) (Oral)  Resp 18  SpO2 100%  LMP 01/24/2014 Physical Exam 1730:   Physical examination:  Nursing notes reviewed; Vital signs and O2 SAT reviewed;  Constitutional: Well developed, Well nourished, Well  hydrated, In no acute distress; Head:  Normocephalic, atraumatic; Eyes: EOMI, PERRL, No scleral icterus; ENMT: Mouth and pharynx normal, Mucous membranes moist; Neck: Supple, Full range of motion, No lymphadenopathy. No meningeal signs.; Cardiovascular: Regular rate and rhythm, No murmur, rub, or gallop; Respiratory: Breath sounds clear & equal bilaterally, No rales, rhonchi, wheezes.  Speaking full sentences with ease, Normal respiratory effort/excursion; Chest: Nontender,  Movement normal; Abdomen: Soft, Nontender, Nondistended, Normal bowel sounds; Genitourinary: No CVA tenderness; Extremities: Pulses normal, No tenderness, No edema, No calf edema or asymmetry.; Neuro: AA&Ox3, Major CN grossly intact. No facial droop. Speech clear. No gross focal motor or sensory deficits in extremities. Climbs on and off stretcher easily by herself. Gait steady. ; Skin: Color normal, Warm, Dry.   ED Course  Procedures     MDM  MDM Reviewed: previous chart, nursing note and vitals Reviewed previous: CT scan Interpretation: CT scan     Ct Head Wo Contrast 01/30/2014   CLINICAL DATA:  Severe headache.  History of pseudotumor.  EXAM: CT HEAD WITHOUT CONTRAST  TECHNIQUE: Contiguous axial images were obtained from the base of the skull through the vertex without intravenous contrast.  COMPARISON:  05/11/2013  FINDINGS: No acute intracranial abnormality. Specifically, no hemorrhage, hydrocephalus, mass lesion, acute infarction, or significant intracranial injury. No acute calvarial abnormality.  Visualized paranasal sinuses and mastoids clear. Orbital soft tissues unremarkable.  IMPRESSION: No acute intracranial abnormality.   Electronically Signed   By: Rolm Baptise M.D.   On: 01/30/2014 18:09     1930:  Pt slept for a short while. Pt easily arousable to her name, states she feels better and "just wants to go home now." Neuro exam remains intact and unchanged. States she will take her BP meds when she gets home. CT scan reassuring; doubt pseudotumor as cause for headache today but will need to f/u with her Neuro MD. Dx and testing d/w pt.  Questions answered.  Verb understanding, agreeable to d/c home with outpt f/u.   Francine Graven, DO 02/02/14 1723

## 2014-01-30 NOTE — ED Notes (Signed)
Pt reports migraine headache since this am. Pt reports severe light/sound sensitivity. Pt reports nausea.

## 2014-01-30 NOTE — ED Notes (Signed)
Pt. Stating, "I am ready to go home"

## 2014-01-30 NOTE — Discharge Instructions (Signed)
°Emergency Department Resource Guide °1) Find a Doctor and Pay Out of Pocket °Although you won't have to find out who is covered by your insurance plan, it is a good idea to ask around and get recommendations. You will then need to call the office and see if the doctor you have chosen will accept you as a new patient and what types of options they offer for patients who are self-pay. Some doctors offer discounts or will set up payment plans for their patients who do not have insurance, but you will need to ask so you aren't surprised when you get to your appointment. ° °2) Contact Your Local Health Department °Not all health departments have doctors that can see patients for sick visits, but many do, so it is worth a call to see if yours does. If you don't know where your local health department is, you can check in your phone book. The CDC also has a tool to help you locate your state's health department, and many state websites also have listings of all of their local health departments. ° °3) Find a Walk-in Clinic °If your illness is not likely to be very severe or complicated, you may want to try a walk in clinic. These are popping up all over the country in pharmacies, drugstores, and shopping centers. They're usually staffed by nurse practitioners or physician assistants that have been trained to treat common illnesses and complaints. They're usually fairly quick and inexpensive. However, if you have serious medical issues or chronic medical problems, these are probably not your best option. ° °No Primary Care Doctor: °- Call [facility] at  832-8000 - they can help you locate a primary care doctor that  accepts your insurance, provides certain services, etc. °- Physician Referral Service- 1-800-533-3463 ° °Chronic Pain Problems: °Organization         Address  Phone   Notes  °[facility]  (336) 297-2271 Patients need to be referred by their primary care doctor.  ° °Medication  Assistance: °Organization         Address  Phone   Notes  °[place] [facility] [address] °[place], [place] [address] (336) 641-8030 --Must be a resident of [place] °-- Must have NO insurance coverage whatsoever (no Medicaid/ Medicare, etc.) °-- The pt. MUST have a primary care doctor that directs their care regularly and follows them in the community °  °[facility]  (866) 331-1348   °[employer]  (888) 892-1162   ° °Agencies that provide inexpensive medical care: °Organization         Address  Phone   Notes  °[facility]  (336) 832-8035   °[facility]    (336) 832-7272   °[facility] [address] °[place], [place] [address] (336) 832-4777   °[facility] [address], °[place] (336) 271-4999   °[facility]    (336) 373-0678   °[facility]    (336) 272-1050   °[facility] ° [address], [place] Phone:  (336) 832-4444, Fax:  (336) 832-4440 Hours of Operation:  9 am - 6 pm, M-F.  Also accepts Medicaid/Medicare and self-pay.  °[facility] ° [address], [place] Phone: (336) [phone], Fax: (336) [phone]. Hours of Operation:  8:30 am - 5:30 pm, M-F.  Also accepts Medicaid and self-pay.  °[facility] [place] [address], [place] Phone: (336) 878-6027   °[facility] [address], [place], [place] (336)723-1848, Ext. 123 Mondays & Thursdays: 7-9 AM.  First 15 patients are seen on a first come, first serve basis. °  ° °Medicaid-accepting [place] Providers: ° °Organization         Address  Phone   Notes  °[facility] [address], [place] (336) 641-2100 Also accepts self-pay patients.  °[facility] [address], [place] ° (336) 856-9996   °[facility] [address], [place]  (336) 288-8857   °[facility] [address] [place] [address], [place] (336) 299-7000   °Veita Bland [address], [place]  ° (336) 373-1557 Only accepts [place] Access Medicaid patients after they have their name applied to their card.  ° °Self-Pay (no insurance) in [place]: ° °Organization         Address  Phone   Notes  °Sickle Cell Patients, [facility] [address], [place] (336) 832-1970   °[facility] [address], [place] (336) 832-4400   °[facility] ° [address], [place] (336) 992-4800   °[facility]/Dr. Osei-Bonsu ° [address] [place] [address], [place] or [address], [place] (336) [phone] Phone number for both [place] and [place] locations is the same.  °[facility] [address], [place] (336) 299-0000   °[facility] [address] [place] [address], [place] or [address] (336) 852-7530 °(336) 878-2260   °[facility] [address], [place] (336) 350-1642, phone; (336) 294-5005, fax Sees patients 1st and 3rd Saturday of every month.  Must not qualify for public or private insurance (i.e. Medicaid, Medicare, [place] Health Choice, Veterans' Benefits) • Household income should be no more than 200% of the poverty level •The clinic cannot treat you if you are pregnant or think you are pregnant • Sexually transmitted diseases are not treated at the clinic.  ° ° °Dental Care: °Organization         Address  Phone  Notes  °[place] [facility] [address],  (336) 641-6152 Accepts children up to age 21 who are enrolled in Medicaid or [place] Health Choice; pregnant women with a Medicaid card; and children who have applied for Medicaid or [place] Health Choice, but were declined, whose parents can pay a reduced fee at time of service.  °[place]  [facility] [place]  [address], [place] (336) 641-7733 Accepts children up to age 21 who are enrolled in Medicaid or [place] Health Choice; pregnant women with a Medicaid card; and children who have applied for Medicaid or [place] Health Choice, but were declined, whose parents can pay a reduced fee at time of service.  °[facility] ° [address],  (336) 641-4533 Patients are seen by appointment only. Walk-ins are not accepted. [facility] will see patients 18 years of age and older. °Monday - Tuesday (8am-5pm) °Most Wednesdays (8:30-5pm) °$30 per visit, cash only  °[facility] ° [address], [place] (336) 641-4533 Patients are seen by appointment only. Walk-ins are not accepted. [facility] will see patients 18 years of age and older. °One   Wednesday Evening (Monthly: Volunteer Based).  $30 per visit, cash only  °[facility]  (919) 537-3737 for adults; Children under age 4, call Graduate Pediatric Dentistry at (919) [phone]. Children aged 4-14, please call (919) [phone] to request a pediatric application. ° Dental services are provided in all areas of dental care including fillings, crowns and bridges, complete and partial dentures, implants, gum treatment, root canals, and extractions. Preventive care is also provided. Treatment is provided to both adults and children. °Patients are selected via a lottery and there is often a waiting list. °  °[facility] [address], °[place] ° (336) 763-8833 [email or web address] °  °[facility] [address], [place], [place] (336)723-1848, Ext. 123 Second and Fourth Thursday of each month, opens at 6:30 AM; Clinic ends at 9 AM.  Patients are seen on a first-come first-served basis, and a limited number are seen during each clinic.  ° °[facility] ° [address], [place], [place] (336) 723-7904    Eligibility Requirements °You must have lived in [place], [place], or [place] counties for at least the last three months. °  You cannot be eligible for state or federal sponsored healthcare insurance, including Veterans Administration, Medicaid, or Medicare. °  You generally cannot be eligible for healthcare insurance through your employer.  °  How to apply: °Eligibility screenings are held every Tuesday and Wednesday afternoon from 1:00 pm until 4:00 pm. You do not need an appointment for the interview!  °[facility] [address], [place], [place] 336-631-2330   °[facility]  336-342-8273   °[facility]  336-703-3100   °[facility]  336-570-6415   ° °Behavioral Health Resources in the Community: °Intensive Outpatient Programs °Organization         Address  Phone  Notes  °[facility] [address], [place], [place] 336-878-6098   °[facility] [address], [place], [place] 336-832-9800   °[facility] [address], [place], [place] ° 336-882-2125   °[facility] [address],  °[place], [place] 1-800-853-5163 or 336-641-4981   °Substance Abuse Resources °Organization         Address  Phone  Notes  °[facility]  336-882-2125   °[facility]  336-784-9470   °[facility]  336-285-9073   °[facility]  336-845-3988   °Residential & Outpatient Substance Abuse Program  1-800-659-3381   °Psychological Services °Organization         Address  Phone  Notes  °[facility]  336- 832-9600   °[facility]  336- 378-7881   °[facility] [address], [place] 1-800-853-5163 or 336-641-4981   ° °Mobile Crisis Teams °Organization         Address  Phone  Notes  °[facility], Mobile Crisis Care Unit  1-877-626-1772   °Assertive °[facility] ° [address]  [place], [place] 336-834-9664   °Sharon DeEsch [address] °[place] 336-554-5454   ° °Self-Help/Support Groups °Organization         Address  Phone             Notes  °[facility] [place] - variety of support groups  336- 373-1402 Call for more information  °Narcotics Anonymous (NA), [facility] [address], °[place]  2 meetings at this location  ° °  Residential Treatment Programs Organization         Address  Phone  Notes  [facility] [address],    [place]  1-(425) 079-2675   [facility]  [address], [place] [address], [place], [place]   [place], [place] 703-743-8847 Admissions: 8am-3pm M-F  [facility] [place] [address],    [place], [place] 258-527-7824   [facility] [address] [place], [place], [place]   [facility] [address]  [place], [place]   [facility] [address]ensive [address], [place], [place]   [facility] ([place] [place].,  [place], [place] 1-(954)700-0044 or (708) 576-3195   [facility] [address], [place], [place] Accepts Medicaid  [facility] [place] [address]  [place] 1-(206)049-0291 Substance Abuse/Addiction Treatment   [facility]         Address  Phone  Notes  [facility]  (628)305-9594   Domenic Schwab, PhD [address] Arlis [place]a [place], [place]   (272)857-7671 or 8590623749   [place] [address] [place], [place] [phone]   [facility] [address], [place], [place] [phone] Insurance/Medicaid/sponsorship through [facility] [address]                                    [place], [place] (319) 591-1481 [place] [address], [place] [phone]    Dr. Adele Schilder  [phone]   [facility] Dept. 1) [address], [place] 2) [place] 3)  [address], [place] [phone]  [phone]   [place] 330-158-2366 or [phone] (After Hours)       Take over the counter benadryl, as directed on packaging, with the prescriptions given to you today, as needed for headache.  Keep a headache diary, as discussed.  Call your regular Neurologist tomorrow to schedule a follow up appointment within the next 2 days.  Return to the Emergency Department immediately sooner if worsening.

## 2014-02-24 ENCOUNTER — Ambulatory Visit: Payer: PRIVATE HEALTH INSURANCE | Admitting: Cardiology

## 2014-03-16 ENCOUNTER — Encounter: Payer: Self-pay | Admitting: Cardiology

## 2014-05-30 ENCOUNTER — Emergency Department (HOSPITAL_COMMUNITY)
Admission: EM | Admit: 2014-05-30 | Discharge: 2014-05-31 | Disposition: A | Discharge status: Home / Self Care | Payer: BC Managed Care – PPO | Attending: Emergency Medicine | Admitting: Emergency Medicine

## 2014-05-30 ENCOUNTER — Encounter (HOSPITAL_COMMUNITY): Payer: Self-pay | Admitting: Emergency Medicine

## 2014-05-30 DIAGNOSIS — Y998 Other external cause status: Secondary | ICD-10-CM | POA: Diagnosis not present

## 2014-05-30 DIAGNOSIS — S41052A Open bite of left shoulder, initial encounter: Secondary | ICD-10-CM | POA: Insufficient documentation

## 2014-05-30 DIAGNOSIS — Z79899 Other long term (current) drug therapy: Secondary | ICD-10-CM | POA: Insufficient documentation

## 2014-05-30 DIAGNOSIS — S199XXA Unspecified injury of neck, initial encounter: Secondary | ICD-10-CM | POA: Diagnosis not present

## 2014-05-30 DIAGNOSIS — Y9289 Other specified places as the place of occurrence of the external cause: Secondary | ICD-10-CM | POA: Diagnosis not present

## 2014-05-30 DIAGNOSIS — Z880 Allergy status to penicillin: Secondary | ICD-10-CM | POA: Diagnosis not present

## 2014-05-30 DIAGNOSIS — Y9389 Activity, other specified: Secondary | ICD-10-CM | POA: Diagnosis not present

## 2014-05-30 DIAGNOSIS — Z23 Encounter for immunization: Secondary | ICD-10-CM | POA: Diagnosis not present

## 2014-05-30 DIAGNOSIS — Z8669 Personal history of other diseases of the nervous system and sense organs: Secondary | ICD-10-CM | POA: Diagnosis not present

## 2014-05-30 DIAGNOSIS — M62838 Other muscle spasm: Secondary | ICD-10-CM | POA: Diagnosis not present

## 2014-05-30 DIAGNOSIS — I10 Essential (primary) hypertension: Secondary | ICD-10-CM | POA: Diagnosis not present

## 2014-05-30 DIAGNOSIS — W503XXA Accidental bite by another person, initial encounter: Secondary | ICD-10-CM

## 2014-05-30 MED ORDER — ONDANSETRON HCL 4 MG PO TABS: 4.0000 mg | ORAL_TABLET | Freq: Four times a day (QID) | ORAL | Status: DC

## 2014-05-30 MED ORDER — CIPROFLOXACIN HCL 750 MG PO TABS: 750.0000 mg | ORAL_TABLET | Freq: Two times a day (BID) | ORAL | Status: DC

## 2014-05-30 MED ORDER — DIAZEPAM 5 MG PO TABS: 5.0000 mg | ORAL_TABLET | Freq: Two times a day (BID) | ORAL | Status: DC

## 2014-05-30 MED ORDER — ONDANSETRON 4 MG PO TBDP
4.0000 mg | ORAL_TABLET | Freq: Once | ORAL | Status: AC
  Administered 2014-05-30: 4 mg via ORAL
  Filled 2014-05-30: qty 1

## 2014-05-30 MED ORDER — CLINDAMYCIN HCL 150 MG PO CAPS
300.0000 mg | ORAL_CAPSULE | Freq: Once | ORAL | Status: AC
  Administered 2014-05-31: 300 mg via ORAL
  Filled 2014-05-30: qty 2

## 2014-05-30 MED ORDER — TETANUS-DIPHTH-ACELL PERTUSSIS 5-2.5-18.5 LF-MCG/0.5 IM SUSP
0.5000 mL | Freq: Once | INTRAMUSCULAR | Status: AC
  Administered 2014-05-31: 0.5 mL via INTRAMUSCULAR
  Filled 2014-05-30: qty 0.5

## 2014-05-30 MED ORDER — CYCLOBENZAPRINE HCL 10 MG PO TABS
5.0000 mg | ORAL_TABLET | Freq: Once | ORAL | Status: AC
  Administered 2014-05-30: 5 mg via ORAL
  Filled 2014-05-30: qty 1

## 2014-05-30 MED ORDER — CLONIDINE HCL 0.1 MG PO TABS
0.1000 mg | ORAL_TABLET | Freq: Once | ORAL | Status: AC
  Administered 2014-05-31: 0.1 mg via ORAL
  Filled 2014-05-30: qty 1

## 2014-05-30 MED ORDER — CLINDAMYCIN HCL 150 MG PO CAPS
300.0000 mg | ORAL_CAPSULE | Freq: Once | ORAL | Status: AC
  Administered 2014-05-30: 300 mg via ORAL
  Filled 2014-05-30: qty 2

## 2014-05-30 MED ORDER — CIPROFLOXACIN HCL 500 MG PO TABS
500.0000 mg | ORAL_TABLET | Freq: Once | ORAL | Status: AC
  Administered 2014-05-31: 500 mg via ORAL
  Filled 2014-05-30: qty 1

## 2014-05-30 MED ORDER — TRAMADOL HCL 50 MG PO TABS: 50.0000 mg | ORAL_TABLET | Freq: Four times a day (QID) | ORAL | Status: DC | PRN

## 2014-05-30 MED ORDER — CLINDAMYCIN HCL 150 MG PO CAPS: 450.0000 mg | ORAL_CAPSULE | Freq: Three times a day (TID) | ORAL | Status: DC
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
## 2014-05-31 ENCOUNTER — Emergency Department (HOSPITAL_COMMUNITY)
Admission: EM | Admit: 2014-05-31 | Discharge: 2014-05-31 | Disposition: A | Discharge status: Home / Self Care | Payer: BC Managed Care – PPO | Source: Home / Self Care | Attending: Emergency Medicine | Admitting: Emergency Medicine

## 2014-05-31 ENCOUNTER — Encounter (HOSPITAL_COMMUNITY): Payer: Self-pay | Admitting: Emergency Medicine

## 2014-05-31 DIAGNOSIS — Z4800 Encounter for change or removal of nonsurgical wound dressing: Secondary | ICD-10-CM | POA: Insufficient documentation

## 2014-05-31 DIAGNOSIS — Z8669 Personal history of other diseases of the nervous system and sense organs: Secondary | ICD-10-CM

## 2014-05-31 DIAGNOSIS — Z5189 Encounter for other specified aftercare: Secondary | ICD-10-CM

## 2014-05-31 DIAGNOSIS — Z79899 Other long term (current) drug therapy: Secondary | ICD-10-CM | POA: Insufficient documentation

## 2014-05-31 DIAGNOSIS — Z880 Allergy status to penicillin: Secondary | ICD-10-CM | POA: Insufficient documentation

## 2014-05-31 DIAGNOSIS — I10 Essential (primary) hypertension: Secondary | ICD-10-CM

## 2014-05-31 MED ORDER — HYDROCODONE-ACETAMINOPHEN 5-325 MG PO TABS: 1.0000 | ORAL_TABLET | ORAL | Status: DC | PRN
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
## 2014-05-31 NOTE — Discharge Instructions (Signed)
Read the information below.  Use the prescribed medication as directed.  Please discuss all new medications with your pharmacist.  Do not take additional tylenol while taking the prescribed pain medication to avoid overdose.  You may return to the Emergency Department at any time for worsening condition or any new symptoms that concern you.  If there is any possibility that you might be pregnant, please let your health care provider know and discuss this with the pharmacist to ensure medication safety.  If you develop redness, swelling, pus draining from the wound, or fevers greater than 100.4, return to the ER immediately for a recheck.
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
## 2014-06-15 ENCOUNTER — Emergency Department (HOSPITAL_COMMUNITY): Payer: BLUE CROSS/BLUE SHIELD

## 2014-06-15 ENCOUNTER — Encounter (HOSPITAL_COMMUNITY): Payer: Self-pay | Admitting: *Deleted

## 2014-06-15 ENCOUNTER — Emergency Department (HOSPITAL_COMMUNITY)
Admission: EM | Admit: 2014-06-15 | Discharge: 2014-06-15 | Disposition: A | Discharge status: Home / Self Care | Payer: BLUE CROSS/BLUE SHIELD | Attending: Emergency Medicine | Admitting: Emergency Medicine

## 2014-06-15 DIAGNOSIS — R52 Pain, unspecified: Secondary | ICD-10-CM

## 2014-06-15 DIAGNOSIS — G43909 Migraine, unspecified, not intractable, without status migrainosus: Secondary | ICD-10-CM | POA: Diagnosis not present

## 2014-06-15 DIAGNOSIS — Z8619 Personal history of other infectious and parasitic diseases: Secondary | ICD-10-CM | POA: Diagnosis not present

## 2014-06-15 DIAGNOSIS — S50812A Abrasion of left forearm, initial encounter: Secondary | ICD-10-CM | POA: Diagnosis not present

## 2014-06-15 DIAGNOSIS — Y92410 Unspecified street and highway as the place of occurrence of the external cause: Secondary | ICD-10-CM | POA: Insufficient documentation

## 2014-06-15 DIAGNOSIS — Z880 Allergy status to penicillin: Secondary | ICD-10-CM | POA: Insufficient documentation

## 2014-06-15 DIAGNOSIS — M79632 Pain in left forearm: Secondary | ICD-10-CM

## 2014-06-15 DIAGNOSIS — S3992XA Unspecified injury of lower back, initial encounter: Secondary | ICD-10-CM | POA: Insufficient documentation

## 2014-06-15 DIAGNOSIS — M62830 Muscle spasm of back: Secondary | ICD-10-CM

## 2014-06-15 DIAGNOSIS — M545 Low back pain, unspecified: Secondary | ICD-10-CM

## 2014-06-15 DIAGNOSIS — Y998 Other external cause status: Secondary | ICD-10-CM | POA: Insufficient documentation

## 2014-06-15 DIAGNOSIS — Z720 Tobacco use: Secondary | ICD-10-CM | POA: Insufficient documentation

## 2014-06-15 DIAGNOSIS — I10 Essential (primary) hypertension: Secondary | ICD-10-CM | POA: Diagnosis not present

## 2014-06-15 DIAGNOSIS — Z79899 Other long term (current) drug therapy: Secondary | ICD-10-CM | POA: Insufficient documentation

## 2014-06-15 DIAGNOSIS — Y9389 Activity, other specified: Secondary | ICD-10-CM | POA: Insufficient documentation

## 2014-06-15 DIAGNOSIS — Z791 Long term (current) use of non-steroidal anti-inflammatories (NSAID): Secondary | ICD-10-CM | POA: Diagnosis not present

## 2014-06-15 DIAGNOSIS — S40812A Abrasion of left upper arm, initial encounter: Secondary | ICD-10-CM

## 2014-06-15 DIAGNOSIS — S59912A Unspecified injury of left forearm, initial encounter: Secondary | ICD-10-CM | POA: Diagnosis present

## 2014-06-15 MED ORDER — NAPROXEN 500 MG PO TABS: 500.0000 mg | ORAL_TABLET | Freq: Two times a day (BID) | ORAL | Status: DC | PRN

## 2014-06-15 MED ORDER — BACITRACIN ZINC 500 UNIT/GM EX OINT: 1.0000 "application " | TOPICAL_OINTMENT | Freq: Two times a day (BID) | CUTANEOUS | Status: DC

## 2014-06-15 MED ORDER — CYCLOBENZAPRINE HCL 10 MG PO TABS: 10.0000 mg | ORAL_TABLET | Freq: Three times a day (TID) | ORAL | Status: DC | PRN

## 2014-06-15 MED ORDER — HYDROCODONE-ACETAMINOPHEN 5-325 MG PO TABS
1.0000 | ORAL_TABLET | Freq: Once | ORAL | Status: DC
Start: 2014-06-15 — End: 2014-06-15

## 2014-06-15 MED ORDER — HYDROCODONE-ACETAMINOPHEN 5-325 MG PO TABS: 1.0000 | ORAL_TABLET | Freq: Four times a day (QID) | ORAL | Status: DC | PRN
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
## 2014-06-15 NOTE — Discharge Instructions (Signed)
Take naprosyn as directed for inflammation and pain with norco for breakthrough pain and flexeril for muscle relaxation. Do not drive or operate machinery with pain medication or muscle relaxation use. Ice to areas of soreness for the next few days and then may move to heat, no more than 20 minutes at a time for each. Expect to be sore for the next few days and follow up with primary care physician for recheck of ongoing symptoms. Use the list below to find a regular doctor. Keep wound and clean with mild soap and water. Keep area covered with a topical antibiotic ointment and bandage, keep bandage dry. Ice and elevate for additional pain relief and swelling. Alternate between Ibuprofen and Tylenol for additional pain relief. Follow up with your primary care doctor or the [facility] [place] in approximately 7 days for wound recheck. Monitor area for signs of infection to include, but not limited to: increasing pain, redness, drainage/pus, or swelling. Return to emergency department for emergent changing or worsening symptoms.   Back Pain, Adult Back pain is very common. The pain often gets better over time. The cause of back pain is usually not dangerous. Most people can learn to manage their back pain on their own.  HOME CARE   Stay active. Start with short walks on flat ground if you can. Try to walk farther each day.  Do not sit, drive, or stand in one place for more than 30 minutes. Do not stay in bed.  Do not avoid exercise or work. Activity can help your back heal faster.  Be careful when you bend or lift an object. Bend at your knees, keep the object close to you, and do not twist.  Sleep on a firm mattress. Lie on your side, and bend your knees. If you lie on your back, put a pillow under your knees.  Only take medicines as told by your doctor.  Put ice on the injured area.  Put ice in a plastic bag.  Place a towel between your skin and the bag.  Leave the ice on for 15-20  minutes, 03-04 times a day for the first 2 to 3 days. After that, you can switch between ice and heat packs.  Ask your doctor about back exercises or massage.  Avoid feeling anxious or stressed. Find good ways to deal with stress, such as exercise. GET HELP RIGHT AWAY IF:   Your pain does not go away with rest or medicine.  Your pain does not go away in 1 week.  You have new problems.  You do not feel well.  The pain spreads into your legs.  You cannot control when you poop (bowel movement) or pee (urinate).  Your arms or legs feel weak or lose feeling (numbness).  You feel sick to your stomach (nauseous) or throw up (vomit).  You have belly (abdominal) pain.  You feel like you may pass out (faint). MAKE SURE YOU:   Understand these instructions.  Will watch your condition.  Will get help right away if you are not doing well or get worse. Document Released: 11/06/2007 Document Revised: 08/12/2011 Document Reviewed: 09/21/2013 [facility] Patient Information 2015 [place], [place]. This information is not intended to replace advice given to you by your health care provider. Make sure you discuss any questions you have with your health care provider.  Cryotherapy Cryotherapy means treatment with cold. Ice or gel packs can be used to reduce both pain and swelling. Ice is the most  helpful within the first 24 to 48 hours after an injury or flare-up from overusing a muscle or joint. Sprains, strains, spasms, burning pain, shooting pain, and aches can all be eased with ice. Ice can also be used when recovering from surgery. Ice is effective, has very few side effects, and is safe for most people to use. PRECAUTIONS  Ice is not a safe treatment option for people with:  Raynaud phenomenon. This is a condition affecting small blood vessels in the extremities. Exposure to cold may cause your problems to return.  Cold hypersensitivity. There are many forms of cold hypersensitivity,  including:  Cold urticaria. Red, itchy hives appear on the skin when the tissues begin to warm after being iced.  Cold erythema. This is a red, itchy rash caused by exposure to cold.  Cold hemoglobinuria. Red blood cells break down when the tissues begin to warm after being iced. The hemoglobin that carry oxygen are passed into the urine because they cannot combine with blood proteins fast enough.  Numbness or altered sensitivity in the area being iced. If you have any of the following conditions, do not use ice until you have discussed cryotherapy with your caregiver:  Heart conditions, such as arrhythmia, angina, or chronic heart disease.  High blood pressure.  Healing wounds or open skin in the area being iced.  Current infections.  Rheumatoid arthritis.  Poor circulation.  Diabetes. Ice slows the blood flow in the region it is applied. This is beneficial when trying to stop inflamed tissues from spreading irritating chemicals to surrounding tissues. However, if you expose your skin to cold temperatures for too long or without the proper protection, you can damage your skin or nerves. Watch for signs of skin damage due to cold. HOME CARE INSTRUCTIONS Follow these tips to use ice and cold packs safely.  Place a dry or damp towel between the ice and skin. A damp towel will cool the skin more quickly, so you may need to shorten the time that the ice is used.  For a more rapid response, add gentle compression to the ice.  Ice for no more than 10 to 20 minutes at a time. The bonier the area you are icing, the less time it will take to get the benefits of ice.  Check your skin after 5 minutes to make sure there are no signs of a poor response to cold or skin damage.  Rest 20 minutes or more between uses.  Once your skin is numb, you can end your treatment. You can test numbness by very lightly touching your skin. The touch should be so light that you do not see the skin dimple from  the pressure of your fingertip. When using ice, most people will feel these normal sensations in this order: cold, burning, aching, and numbness.  Do not use ice on someone who cannot communicate their responses to pain, such as small children or people with dementia. HOW TO MAKE AN ICE PACK Ice packs are the most common way to use ice therapy. Other methods include ice massage, ice baths, and cryosprays. Muscle creams that cause a cold, tingly feeling do not offer the same benefits that ice offers and should not be used as a substitute unless recommended by your caregiver. To make an ice pack, do one of the following:  Place crushed ice or a bag of frozen vegetables in a sealable plastic bag. Squeeze out the excess air. Place this bag inside another plastic bag. Slide  the bag into a pillowcase or place a damp towel between your skin and the bag.  Mix 3 parts water with 1 part rubbing alcohol. Freeze the mixture in a sealable plastic bag. When you remove the mixture from the freezer, it will be slushy. Squeeze out the excess air. Place this bag inside another plastic bag. Slide the bag into a pillowcase or place a damp towel between your skin and the bag. SEEK MEDICAL CARE IF:  You develop white spots on your skin. This may give the skin a blotchy (mottled) appearance.  Your skin turns blue or pale.  Your skin becomes waxy or hard.  Your swelling gets worse. MAKE SURE YOU:   Understand these instructions.  Will watch your condition.  Will get help right away if you are not doing well or get worse. Document Released: 01/14/2011 Document Revised: 10/04/2013 Document Reviewed: 01/14/2011 [facility] Patient Information 2015 [place], [place]. This information is not intended to replace advice given to you by your health care provider. Make sure you discuss any questions you have with your health care provider.  Abrasions An abrasion is a cut or scrape of the skin. Abrasions do not go through  all layers of the skin. HOME CARE  If a bandage (dressing) was put on your wound, change it as told by your doctor. If the bandage sticks, soak it off with warm.  Wash the area with water and soap 2 times a day. Rinse off the soap. Pat the area dry with a clean towel.  Put on medicated cream (ointment) as told by your doctor.  Change your bandage right away if it gets wet or dirty.  Only take medicine as told by your doctor.  See your doctor within 24-48 hours to get your wound checked.  Check your wound for redness, puffiness (swelling), or yellowish-white fluid (pus). GET HELP RIGHT AWAY IF:   You have more pain in the wound.  You have redness, swelling, or tenderness around the wound.  You have pus coming from the wound.  You have a fever or lasting symptoms for more than 2-3 days.  You have a fever and your symptoms suddenly get worse.  You have a bad smell coming from the wound or bandage. MAKE SURE YOU:   Understand these instructions.  Will watch your condition.  Will get help right away if you are not doing well or get worse. Document Released: 11/06/2007 Document Revised: 02/12/2012 Document Reviewed: 04/23/2011 [facility] Patient Information 2015 [place], [place]. This information is not intended to replace advice given to you by your health care provider. Make sure you discuss any questions you have with your health care provider.  Wound Care Wound care helps prevent pain and infection.  You may need a tetanus shot if:  You cannot remember when you had your last tetanus shot.  You have never had a tetanus shot.  The injury broke your skin. If you need a tetanus shot and you choose not to have one, you may get tetanus. Sickness from tetanus can be serious. HOME CARE   Only take medicine as told by your doctor.  Clean the wound daily with mild soap and water.  Change any bandages (dressings) as told by your doctor.  Put medicated cream and a bandage on  the wound as told by your doctor.  Change the bandage if it gets wet, dirty, or starts to smell.  Take showers. Do not take baths, swim, or do anything that puts your wound  under water.  Rest and raise (elevate) the wound until the pain and puffiness (swelling) are better.  Keep all doctor visits as told. GET HELP RIGHT AWAY IF:   Yellowish-white fluid (pus) comes from the wound.  Medicine does not lessen your pain.  There is a red streak going away from the wound.  You have a fever. MAKE SURE YOU:   Understand these instructions.  Will watch your condition.  Will get help right away if you are not doing well or get worse. Document Released: 02/27/2008 Document Revised: 08/12/2011 Document Reviewed: 09/23/2010 [facility] Patient Information 2015 [place], [place]. This information is not intended to replace advice given to you by your health care provider. Make sure you discuss any questions you have with your health care provider. Emergency Department Resource Guide 1) Find a Doctor and Pay Out of Pocket Although you won't have to find out who is covered by your insurance plan, it is a good idea to ask around and get recommendations. You will then need to call the office and see if the doctor you have chosen will accept you as a new patient and what types of options they offer for patients who are self-pay. Some doctors offer discounts or will set up payment plans for their patients who do not have insurance, but you will need to ask so you aren't surprised when you get to your appointment.  2) Contact Your Local Health Department Not all health departments have doctors that can see patients for sick visits, but many do, so it is worth a call to see if yours does. If you don't know where your local health department is, you can check in your phone book. The CDC also has a tool to help you locate your state's health department, and many state websites also have listings of all of their  local health departments.  3) Find a [facility] If your illness is not likely to be very severe or complicated, you may want to try a walk in clinic. These are popping up all over the country in pharmacies, drugstores, and shopping centers. They're usually staffed by nurse practitioners or physician assistants that have been trained to treat common illnesses and complaints. They're usually fairly quick and inexpensive. However, if you have serious medical issues or chronic medical problems, these are probably not your best option.  No Primary Care Doctor: - Call Health Connect at  [phone] - they can help you locate a primary care doctor that  accepts your insurance, provides certain services, etc. - Physician Referral Service- [phone]  Chronic Pain Problems: Organization         Address  Phone   Notes  [facility]  [phone] Patients need to be referred by their primary care doctor.   Medication Assistance: Organization         Address  Phone   Notes  [facility] Medication [facility] [address] [place], [place] [address] [phone] --Must be a resident of [facility] -- Must have NO insurance coverage whatsoever (no Medicaid/ Medicare, etc.) -- The pt. MUST have a primary care doctor that directs their care regularly and follows them in the community   [facility]  [phone]   [employer]  [phone]    Agencies that provide inexpensive medical care: Organization         Address  Phone   Notes  [place]  [phone]   Gershon Mussel  [facility] Internal [facility]    [phone]   [facility] [place], [place] [address] [phone]   [place] [address], [place] [phone]   [facility]anned Parenthood    [phone]   [facility]    [phone]   [place] and [place] [address], [place]  Phone:  [phone], Fax:  [phone] Hours of Operation:  9 am - 6 pm, M-F.  Also accepts Medicaid/Medicare and self-pay.  [facility] for [place] [address], [address], [place] Phone: [phone], Fax: [phone]. Hours of Operation:  8:30 am - 5:30 pm, M-F.  Also accepts Medicaid and self-pay.  [facility] [address], [place] Phone: [phone]   [place], [place], [place] [phone], Ext. [phone] Mondays & Thursdays: 7-9 AM.  First 15 patients are seen on a first come, first serve basis.    [place] Providers:  Organization         Address  Phone   Notes  [facility] [address], [place] [phone] Also accepts self-pay patients.  [facility] [address], [place]  [phone]   [place], [address], [place] [phone]   [facility] [address], [place] [phone]   Lucianne Lei [address], [place]   [phone] Only accepts [place] patients after they have their name applied to their card.   Self-Pay (no insurance) in [facility]:  Organization         Address  Phone   Notes  Sickle Cell Patients, [facility] [place] [phone]   [facility] [place] [phone]   [facility] [place]  [place], [place], [place] [phone]   [facility]/Dr. Osei-Bonsu  [address] [place] or [address], [place] [phone] Phone number for both [place] and [place] locations is the same.  [facility] [address], [place] [phone]   [facility] [address] [place] or [address]  Dr [phone]   [facility] [address] [place] [phone], phone; [phone], fax Sees patients 1st and 3rd Saturday of every month.  Must not qualify for public or private insurance (i.e. Medicaid, Medicare, [place] Health Choice, Veterans' Benefits)  Household income should be no more than 200% of the poverty level The clinic cannot treat you if you are pregnant or think you are pregnant  Sexually transmitted diseases are not treated at the clinic.

## 2014-06-15 NOTE — ED Notes (Signed)
Patient didn't want to wait for vitals, lumbar xray, or POC preg.  Patient wanted to go home.
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
## 2014-06-27 ENCOUNTER — Emergency Department (HOSPITAL_COMMUNITY)
Admission: EM | Admit: 2014-06-27 | Discharge: 2014-06-27 | Disposition: A | Discharge status: Home / Self Care | Payer: BLUE CROSS/BLUE SHIELD | Attending: Emergency Medicine | Admitting: Emergency Medicine

## 2014-06-27 ENCOUNTER — Emergency Department (HOSPITAL_COMMUNITY): Payer: BLUE CROSS/BLUE SHIELD

## 2014-06-27 ENCOUNTER — Encounter (HOSPITAL_COMMUNITY): Payer: Self-pay | Admitting: Emergency Medicine

## 2014-06-27 DIAGNOSIS — Z880 Allergy status to penicillin: Secondary | ICD-10-CM | POA: Insufficient documentation

## 2014-06-27 DIAGNOSIS — Z792 Long term (current) use of antibiotics: Secondary | ICD-10-CM | POA: Insufficient documentation

## 2014-06-27 DIAGNOSIS — Z79899 Other long term (current) drug therapy: Secondary | ICD-10-CM | POA: Diagnosis not present

## 2014-06-27 DIAGNOSIS — R059 Cough, unspecified: Secondary | ICD-10-CM

## 2014-06-27 DIAGNOSIS — R05 Cough: Secondary | ICD-10-CM | POA: Diagnosis present

## 2014-06-27 DIAGNOSIS — G43909 Migraine, unspecified, not intractable, without status migrainosus: Secondary | ICD-10-CM | POA: Insufficient documentation

## 2014-06-27 DIAGNOSIS — I10 Essential (primary) hypertension: Secondary | ICD-10-CM | POA: Insufficient documentation

## 2014-06-27 DIAGNOSIS — J069 Acute upper respiratory infection, unspecified: Secondary | ICD-10-CM | POA: Diagnosis not present

## 2014-06-27 DIAGNOSIS — B9789 Other viral agents as the cause of diseases classified elsewhere: Secondary | ICD-10-CM

## 2014-06-27 LAB — RAPID STREP SCREEN (MED CTR MEBANE ONLY): STREPTOCOCCUS, GROUP A SCREEN (DIRECT): NEGATIVE

## 2014-06-27 MED ORDER — HYDROCODONE-HOMATROPINE 5-1.5 MG/5ML PO SYRP: 5.0000 mL | ORAL_SOLUTION | Freq: Four times a day (QID) | ORAL | Status: DC | PRN

## 2014-06-27 NOTE — Discharge Instructions (Signed)

## 2014-06-27 NOTE — ED Provider Notes (Signed)
CSN: [id number]     Arrival date & time 06/27/14  1720 History  This chart was scribed for non-physician practitioner, Hyman Bible, PA-C,  working with Malvin Johns, MD, by Ian Bushman, ED Scribe. This patient was seen in room TR10C/TR10C and the patient's care was started at 7:23 PM.    First MD Initiated Contact with Patient 06/27/14 [place]     Chief Complaint  Patient presents with  . Cough  . Generalized Body Aches     (Consider location/radiation/quality/duration/timing/severity/associated sxs/prior Treatment) Patient is a 39 y.o. female presenting with cough. The history is provided by the patient. No language interpreter was used.  Cough Associated symptoms: chills, myalgias and sore throat   Associated symptoms: no fever     HPI Comments: Annette Deleon is a 39 y.o. female who presents to the Emergency Department complaining of a dry deep cough, sore throat and generalized body aches and dyspnea on exertion.   Notes that her symptoms have been going on for 48 hours. Notes she had chills last night but did not take her temp.  Patient has psuedotumor cerebri, migraines and high blood pressure  Patient does not smoke. Denies sinus pain or pressure.  Her two kids are in pediatrics being seen for similar symptoms.  She has been taking Delsym for her symptoms without improvement.     Past Medical History  Diagnosis Date  . Hypertension   . Pseudotumor cerebri   . Migraines    Past Surgical History  Procedure Laterality Date  . Cesarean section    . Cholecystectomy    . Myomectomy    . Knee surgery      right   History reviewed. No pertinent family history. History  Substance Use Topics  . Smoking status: Never Smoker   . Smokeless tobacco: Not on file  . Alcohol Use: Yes     Comment: occasionally   OB History    No data available     Review of Systems  Constitutional: Positive for chills. Negative for fever.  HENT: Positive for sore throat. Negative for  sinus pressure.   Respiratory: Positive for cough.   Gastrointestinal: Negative for nausea.  Musculoskeletal: Positive for myalgias.      Allergies  Imitrex; Yellow jacket venom; Ibuprofen; and Penicillins  Home Medications   Prior to Admission medications   Medication Sig Start Date End Date Taking? Authorizing Provider  acetaminophen (TYLENOL) 500 MG tablet Take 1,000-1,500 mg by mouth every 6 (six) hours as needed. Pain    Historical Provider, MD  amLODipine (NORVASC) 10 MG tablet Take 10 mg by mouth daily.      Historical Provider, MD  bacitracin ointment Apply 1 application topically 2 (two) times daily. 06/15/14   Mercedes Strupp Camprubi-Soms, PA-C  ciprofloxacin (CIPRO) 750 MG tablet Take 1 tablet (750 mg total) by mouth 2 (two) times daily. Patient not taking: Reported on 06/15/2014 05/30/14   Linus Mako, PA-C  clindamycin (CLEOCIN) 150 MG capsule Take 3 capsules (450 mg total) by mouth 3 (three) times daily. Patient not taking: Reported on 06/15/2014 05/30/14   Linus Mako, PA-C  cyclobenzaprine (FLEXERIL) 10 MG tablet Take 1 tablet (10 mg total) by mouth 3 (three) times daily as needed for muscle spasms. 06/15/14   Mercedes Strupp Camprubi-Soms, PA-C  diazepam (VALIUM) 5 MG tablet Take 1 tablet (5 mg total) by mouth 2 (two) times daily. Patient not taking: Reported on 06/15/2014 05/30/14   Linus Mako, PA-C  hydrochlorothiazide (HYDRODIURIL)  25 MG tablet Take 25 mg by mouth daily.    Historical Provider, MD  HYDROcodone-acetaminophen (NORCO) 5-325 MG per tablet Take 1-2 tablets by mouth every 6 (six) hours as needed for severe pain. 06/15/14   Mercedes Strupp Camprubi-Soms, PA-C  HYDROcodone-acetaminophen (NORCO/VICODIN) 5-325 MG per tablet Take 1 tablet by mouth every 4 (four) hours as needed for moderate pain or severe pain. Patient not taking: Reported on 06/15/2014 05/31/14   Clayton Bibles, PA-C  ibuprofen (ADVIL,MOTRIN) 200 MG tablet Take 600 mg by mouth every 6  (six) hours as needed.    Historical Provider, MD  losartan-hydrochlorothiazide (HYZAAR) 100-25 MG per tablet Take 1 tablet by mouth 2 (two) times daily.    Historical Provider, MD  naproxen (NAPROSYN) 500 MG tablet Take 1 tablet (500 mg total) by mouth 2 (two) times daily as needed for mild pain, moderate pain or headache (TAKE WITH MEALS.). 06/15/14   Mercedes Strupp Camprubi-Soms, PA-C  naproxen sodium (ALEVE) 220 MG tablet Take 440 mg by mouth as needed. For pain    Historical Provider, MD  ondansetron (ZOFRAN) 4 MG tablet Take 1 tablet (4 mg total) by mouth every 6 (six) hours. Patient not taking: Reported on 06/15/2014 05/30/14   Linus Mako, PA-C  promethazine (PHENERGAN) 25 MG tablet Take 1 tablet (25 mg total) by mouth every 6 (six) hours as needed for nausea or vomiting. Patient not taking: Reported on 06/15/2014 01/30/14   Francine Graven, DO  traMADol (ULTRAM) 50 MG tablet Take 1 tablet (50 mg total) by mouth every 6 (six) hours as needed. Patient not taking: Reported on 06/15/2014 05/30/14   Linus Mako, PA-C   BP 187/112 mmHg  Pulse 104  Temp(Src) 98.2 F (36.8 C) (Oral)  Resp 20  SpO2 100%  LMP 05/26/2014 Physical Exam  Constitutional: She appears well-developed and well-nourished.  HENT:  Head: Normocephalic and atraumatic.  Right Ear: Tympanic membrane and ear canal normal.  Left Ear: Tympanic membrane and ear canal normal.  Nose: Nose normal.  Mouth/Throat: Uvula is midline and mucous membranes are normal. Posterior oropharyngeal erythema present. No oropharyngeal exudate or posterior oropharyngeal edema.  Neck: Normal range of motion. Neck supple.  Cardiovascular: Normal rate, regular rhythm and normal heart sounds.   Pulmonary/Chest: Effort normal and breath sounds normal.  Neurological: She is alert.  Skin: Skin is warm and dry.  Psychiatric: She has a normal mood and affect.  Nursing note and vitals reviewed.   ED Course  Procedures (including critical  care time) DIAGNOSTIC STUDIES: Oxygen Saturation is 100% on RA, normal by my interpretation.    COORDINATION OF CARE: 7:27 PM Discussed treatment plan with patient at beside, the patient agrees with the plan and has no further questions at this time.    Labs Review Labs Reviewed  RAPID STREP SCREEN    Imaging Review Dg Chest 2 View  06/27/2014   CLINICAL DATA:  Dry deep cough, sore throat, generalized body aches and dyspnea on exertion. Initial encounter.  EXAM: CHEST  2 VIEW  COMPARISON:  Chest radiograph performed 08/16/2010  FINDINGS: The lungs are well-aerated and clear. There is no evidence of focal opacification, pleural effusion or pneumothorax.  The heart is normal in size; the mediastinal contour is within normal limits. No acute osseous abnormalities are seen.  IMPRESSION: No acute cardiopulmonary process seen.   Electronically Signed   By: Garald Balding M.D.   On: 06/27/2014 20:46     EKG Interpretation None  Filed Vitals:   06/27/14 2034  BP: 166/98  Pulse: 82  Temp: 98.2 F (36.8 C)  Resp: 20   MDM   Final diagnoses:  None   Pt CXR negative for acute infiltrate. Patients symptoms are consistent with URI, likely viral etiology. Rapid strep negative. Discussed that antibiotics are not indicated for viral infections. Pt will be discharged with symptomatic treatment.  Verbalizes understanding and is agreeable with plan. Pt is hemodynamically stable & in NAD prior to dc.   Hyman Bible, PA-C 06/27/14 [place], MD 06/27/14 2330

## 2014-06-27 NOTE — ED Notes (Signed)
Pt c/o URI sx with body aches and cough; pt sts fever and body aches

## 2014-06-29 LAB — CULTURE, GROUP A STREP

## 2014-08-31 ENCOUNTER — Emergency Department (HOSPITAL_COMMUNITY)
Admission: EM | Admit: 2014-08-31 | Discharge: 2014-08-31 | Disposition: A | Discharge status: Home / Self Care | Payer: PRIVATE HEALTH INSURANCE | Attending: Emergency Medicine | Admitting: Emergency Medicine

## 2014-08-31 ENCOUNTER — Encounter (HOSPITAL_COMMUNITY): Payer: Self-pay | Admitting: Family Medicine

## 2014-08-31 DIAGNOSIS — G43909 Migraine, unspecified, not intractable, without status migrainosus: Secondary | ICD-10-CM | POA: Insufficient documentation

## 2014-08-31 DIAGNOSIS — I10 Essential (primary) hypertension: Secondary | ICD-10-CM

## 2014-08-31 DIAGNOSIS — Z880 Allergy status to penicillin: Secondary | ICD-10-CM | POA: Insufficient documentation

## 2014-08-31 DIAGNOSIS — Z79899 Other long term (current) drug therapy: Secondary | ICD-10-CM | POA: Insufficient documentation

## 2014-08-31 LAB — I-STAT CHEM 8, ED
BUN: 17 mg/dL (ref 6–23)
Calcium, Ion: 1.2 mmol/L (ref 1.12–1.23)
Chloride: 101 mmol/L (ref 96–112)
Creatinine, Ser: 1 mg/dL (ref 0.50–1.10)
GLUCOSE: 111 mg/dL — AB (ref 70–99)
HCT: 39 % (ref 36.0–46.0)
Hemoglobin: 13.3 g/dL (ref 12.0–15.0)
Potassium: 3.9 mmol/L (ref 3.5–5.1)
SODIUM: 139 mmol/L (ref 135–145)
TCO2: 25 mmol/L (ref 0–100)

## 2014-08-31 NOTE — ED Notes (Signed)
Right 20g AC PIV removed, but was not found in chart. Catheter intact. Dressing Applied.

## 2014-08-31 NOTE — ED Notes (Signed)
Pt sent here by PCP with HTN. sts she has been off of medication for a while. sts was given 2 clonidine at doctors office. Denies pain. No chest pain, SOB.

## 2014-08-31 NOTE — ED Provider Notes (Signed)
CSN: [id number]     Arrival date & time 08/31/14  1810 History   First MD Initiated Contact with Patient 08/31/14 1843     Chief Complaint  Patient presents with  . Hypertension     (Consider location/radiation/quality/duration/timing/severity/associated sxs/prior Treatment) HPI   Annette Deleon is a 39 year old female with past medical history of hypertension, who comes in from her PCP office because of high blood pressure. The PCP office earlier today patient was noticed to be very hypertensive, she was given clonidine 2 prior to arrival. Patient not been taking her blood pressure medicines for last month they were represcribed by the primary care doctor today. She denies any chest pain, shortness of breath, headache, abdominal pain.  Past Medical History  Diagnosis Date  . Hypertension   . Pseudotumor cerebri   . Migraines    Past Surgical History  Procedure Laterality Date  . Cesarean section    . Cholecystectomy    . Myomectomy    . Knee surgery      right   History reviewed. No pertinent family history. History  Substance Use Topics  . Smoking status: Never Smoker   . Smokeless tobacco: Not on file  . Alcohol Use: Yes     Comment: occasionally   OB History    No data available     Review of Systems  Constitutional: Negative for fever and chills.  HENT: Negative for nosebleeds.   Eyes: Negative for visual disturbance.  Respiratory: Negative for cough and shortness of breath.   Cardiovascular: Negative for chest pain.  Gastrointestinal: Negative for nausea, vomiting, abdominal pain, diarrhea and constipation.  Genitourinary: Negative for dysuria.  Skin: Negative for rash.  Neurological: Negative for weakness.  All other systems reviewed and are negative.     Allergies  Imitrex; Yellow jacket venom; Ibuprofen; and Penicillins  Home Medications   Prior to Admission medications   Medication Sig Start Date End Date Taking? Authorizing Provider   acetaminophen (TYLENOL) 500 MG tablet Take 1,000-1,500 mg by mouth every 6 (six) hours as needed. Pain    Historical Provider, MD  amLODipine (NORVASC) 10 MG tablet Take 10 mg by mouth daily.      Historical Provider, MD  bacitracin ointment Apply 1 application topically 2 (two) times daily. 06/15/14   Mercedes Camprubi-Soms, PA-C  ciprofloxacin (CIPRO) 750 MG tablet Take 1 tablet (750 mg total) by mouth 2 (two) times daily. Patient not taking: Reported on 06/15/2014 05/30/14   Delos Haring, PA-C  clindamycin (CLEOCIN) 150 MG capsule Take 3 capsules (450 mg total) by mouth 3 (three) times daily. Patient not taking: Reported on 06/15/2014 05/30/14   Delos Haring, PA-C  cyclobenzaprine (FLEXERIL) 10 MG tablet Take 1 tablet (10 mg total) by mouth 3 (three) times daily as needed for muscle spasms. 06/15/14   Mercedes Camprubi-Soms, PA-C  diazepam (VALIUM) 5 MG tablet Take 1 tablet (5 mg total) by mouth 2 (two) times daily. Patient not taking: Reported on 06/15/2014 05/30/14   Delos Haring, PA-C  hydrochlorothiazide (HYDRODIURIL) 25 MG tablet Take 25 mg by mouth daily.    Historical Provider, MD  HYDROcodone-acetaminophen (NORCO) 5-325 MG per tablet Take 1-2 tablets by mouth every 6 (six) hours as needed for severe pain. 06/15/14   Mercedes Camprubi-Soms, PA-C  HYDROcodone-acetaminophen (NORCO/VICODIN) 5-325 MG per tablet Take 1 tablet by mouth every 4 (four) hours as needed for moderate pain or severe pain. Patient not taking: Reported on 06/15/2014 05/31/14   Clayton Bibles, PA-C  HYDROcodone-homatropine [facility])  5-1.5 MG/5ML syrup Take 5 mLs by mouth every 6 (six) hours as needed for cough. 06/27/14   Heather Laisure, PA-C  ibuprofen (ADVIL,MOTRIN) 200 MG tablet Take 600 mg by mouth every 6 (six) hours as needed.    Historical Provider, MD  losartan-hydrochlorothiazide (HYZAAR) 100-25 MG per tablet Take 1 tablet by mouth 2 (two) times daily.    Historical Provider, MD  naproxen (NAPROSYN) 500 MG tablet  Take 1 tablet (500 mg total) by mouth 2 (two) times daily as needed for mild pain, moderate pain or headache (TAKE WITH MEALS.). 06/15/14   Mercedes Camprubi-Soms, PA-C  naproxen sodium (ALEVE) 220 MG tablet Take 440 mg by mouth as needed. For pain    Historical Provider, MD  ondansetron (ZOFRAN) 4 MG tablet Take 1 tablet (4 mg total) by mouth every 6 (six) hours. Patient not taking: Reported on 06/15/2014 05/30/14   Delos Haring, PA-C  promethazine (PHENERGAN) 25 MG tablet Take 1 tablet (25 mg total) by mouth every 6 (six) hours as needed for nausea or vomiting. Patient not taking: Reported on 06/15/2014 01/30/14   Francine Graven, DO  traMADol (ULTRAM) 50 MG tablet Take 1 tablet (50 mg total) by mouth every 6 (six) hours as needed. Patient not taking: Reported on 06/15/2014 05/30/14   Delos Haring, PA-C   BP 223/128 mmHg  Pulse 97  Temp(Src) 98 F (36.7 C)  Resp 18  Ht 5\' 3"  (1.6 m)  Wt 270 lb (122.471 kg)  BMI 47.84 kg/m2  SpO2 100%  LMP 08/16/2014 Physical Exam  Constitutional: She is oriented to person, place, and time. No distress.  HENT:  Head: Normocephalic and atraumatic.  Eyes: EOM are normal. Pupils are equal, round, and reactive to light.  Neck: Normal range of motion. Neck supple.  Cardiovascular: Normal rate and intact distal pulses.   Pulmonary/Chest: No respiratory distress.  Abdominal: Soft. There is no tenderness.  Musculoskeletal: Normal range of motion.  Neurological: She is alert and oriented to person, place, and time.  Skin: No rash noted. She is not diaphoretic.  Psychiatric: She has a normal mood and affect.    ED Course  Procedures (including critical care time) Labs Review Labs Reviewed  I-STAT CHEM 8, ED    Imaging Review No results found.   EKG Interpretation None      MDM   Final diagnoses:  None    Annette Deleon is a 39 year old female with past medical history of hypertension, who comes in from her PCP office because of high blood  pressure.  Exam as above.  htn to 223/128 initially but improved to 170's while I was in the room.  Will obtain istat chem8  Patient w/o headache/CP/SOB/abdominal pain/vision changes.  Feel no htn emergency at this time.  ekg WNL, istat chem 8 w/ normal renal function and otherwise unremarkable.  bp better in the 160's.  Feel safe for d/c w/ outpatient f/u.  I have discussed the results, Dx and Tx plan with the patient. They expressed understanding and agree with the plan and were told to return to ED with any worsening of condition or concern.    Disposition: Discharge  Condition: Good  Discharge Medication List as of 08/31/2014  8:24 PM      Follow Up: Barrie Lyme, [address] [place] [address] [phone]      Pt seen in conjunction with Dr. Cleatis Polka, MD 09/01/14 [place],  MD 09/03/14 1535

## 2014-08-31 NOTE — Discharge Instructions (Signed)

## 2014-09-08 ENCOUNTER — Emergency Department (HOSPITAL_COMMUNITY): Payer: PRIVATE HEALTH INSURANCE

## 2014-09-08 ENCOUNTER — Encounter (HOSPITAL_COMMUNITY): Payer: Self-pay | Admitting: *Deleted

## 2014-09-08 ENCOUNTER — Observation Stay (HOSPITAL_COMMUNITY)
Admission: EM | Admit: 2014-09-08 | Discharge: 2014-09-08 | Disposition: A | Discharge status: Home / Self Care | Payer: PRIVATE HEALTH INSURANCE | Attending: Emergency Medicine | Admitting: Emergency Medicine

## 2014-09-08 DIAGNOSIS — R0781 Pleurodynia: Secondary | ICD-10-CM | POA: Insufficient documentation

## 2014-09-08 DIAGNOSIS — Z792 Long term (current) use of antibiotics: Secondary | ICD-10-CM | POA: Insufficient documentation

## 2014-09-08 DIAGNOSIS — Z79899 Other long term (current) drug therapy: Secondary | ICD-10-CM | POA: Insufficient documentation

## 2014-09-08 DIAGNOSIS — I10 Essential (primary) hypertension: Secondary | ICD-10-CM | POA: Insufficient documentation

## 2014-09-08 DIAGNOSIS — R55 Syncope and collapse: Secondary | ICD-10-CM | POA: Diagnosis present

## 2014-09-08 DIAGNOSIS — Z791 Long term (current) use of non-steroidal anti-inflammatories (NSAID): Secondary | ICD-10-CM | POA: Insufficient documentation

## 2014-09-08 DIAGNOSIS — R091 Pleurisy: Principal | ICD-10-CM | POA: Insufficient documentation

## 2014-09-08 DIAGNOSIS — Z79891 Long term (current) use of opiate analgesic: Secondary | ICD-10-CM | POA: Insufficient documentation

## 2014-09-08 DIAGNOSIS — R0602 Shortness of breath: Secondary | ICD-10-CM | POA: Insufficient documentation

## 2014-09-08 LAB — CBC WITH DIFFERENTIAL/PLATELET
Basophils Absolute: 0.1 10*3/uL (ref 0.0–0.1)
Basophils Relative: 0 % (ref 0–1)
Eosinophils Absolute: 0.3 10*3/uL (ref 0.0–0.7)
Eosinophils Relative: 2 % (ref 0–5)
HCT: 36.6 % (ref 36.0–46.0)
Hemoglobin: 12.6 g/dL (ref 12.0–15.0)
LYMPHS ABS: 3.7 10*3/uL (ref 0.7–4.0)
LYMPHS PCT: 31 % (ref 12–46)
MCH: 27.6 pg (ref 26.0–34.0)
MCHC: 34.4 g/dL (ref 30.0–36.0)
MCV: 80.3 fL (ref 78.0–100.0)
Monocytes Absolute: 0.7 10*3/uL (ref 0.1–1.0)
Monocytes Relative: 6 % (ref 3–12)
NEUTROS PCT: 61 % (ref 43–77)
Neutro Abs: 7.1 10*3/uL (ref 1.7–7.7)
PLATELETS: 387 10*3/uL (ref 150–400)
RBC: 4.56 MIL/uL (ref 3.87–5.11)
RDW: 14 % (ref 11.5–15.5)
WBC: 11.9 10*3/uL — AB (ref 4.0–10.5)

## 2014-09-08 LAB — BASIC METABOLIC PANEL
Anion gap: 7 (ref 5–15)
BUN: 11 mg/dL (ref 6–23)
CO2: 25 mmol/L (ref 19–32)
Calcium: 8.6 mg/dL (ref 8.4–10.5)
Chloride: 105 mmol/L (ref 96–112)
Creatinine, Ser: 0.94 mg/dL (ref 0.50–1.10)
GFR, EST AFRICAN AMERICAN: 87 mL/min — AB (ref >90)
GFR, EST NON AFRICAN AMERICAN: 75 mL/min — AB (ref >90)
GLUCOSE: 109 mg/dL — AB (ref 70–99)
POTASSIUM: 3.7 mmol/L (ref 3.5–5.1)
Sodium: 137 mmol/L (ref 135–145)

## 2014-09-08 LAB — TROPONIN I: Troponin I: <0.03 ng/mL (ref <0.031)

## 2014-09-08 LAB — BRAIN NATRIURETIC PEPTIDE: B Natriuretic Peptide: 113.3 pg/mL — ABNORMAL HIGH (ref 0.0–100.0)

## 2014-09-08 LAB — POC URINE PREG, ED: Preg Test, Ur: NEGATIVE

## 2014-09-08 MED ORDER — OXYCODONE-ACETAMINOPHEN 5-325 MG PO TABS: 1.0000 | ORAL_TABLET | ORAL | Status: DC | PRN

## 2014-09-08 MED ORDER — OXYCODONE-ACETAMINOPHEN 5-325 MG PO TABS
1.0000 | ORAL_TABLET | Freq: Once | ORAL | Status: AC
  Administered 2014-09-08: 1 via ORAL
  Filled 2014-09-08: qty 1

## 2014-09-08 MED ORDER — DEXAMETHASONE 4 MG PO TABS
12.0000 mg | ORAL_TABLET | Freq: Once | ORAL | Status: AC
  Administered 2014-09-08: 12 mg via ORAL
  Filled 2014-09-08: qty 3

## 2014-09-08 NOTE — ED Notes (Signed)
Patient states she took aspirin for chest pain

## 2014-09-08 NOTE — ED Provider Notes (Signed)
CSN: [id number]     Arrival date & time 09/08/14  0132 History   First MD Initiated Contact with Patient 09/08/14 0248     This chart was scribed for Delora Fuel, MD by Forrestine Him, ED Scribe. This patient was seen in room A10C/A10C and the patient's care was started 2:52 AM.   Chief Complaint  Patient presents with  . Chest Pain   HPI  HPI Comments: Annette Deleon is a 39 y.o. female with a PMHx of HTN and Migraines who presents to the Emergency Department complaining of constant, ongoing R upper chest pain onset 8:00 PM yesterday evening 4/6. She also reports SOB and cough. Pain is described as sharp and rated 6/10 at rest, 8/10 with deep breathing. Pain is exacerbated with deep breathing and cough without any alleviating factors. Annette Deleon has tried OTC Tylenol and Asprin at home without any improvement for symptoms. She denies any nausea, vomiting. No recent travel. No recent surgeries. She denies currently being on any birth control. LNMP 3/15. Pt with known allergies to Imitrex, Ibuprofen, and Penicillins.  PCP- Barrie Lyme, FNP   Past Medical History  Diagnosis Date  . Hypertension   . Pseudotumor cerebri   . Migraines    Past Surgical History  Procedure Laterality Date  . Cesarean section    . Cholecystectomy    . Myomectomy    . Knee surgery      right   No family history on file. History  Substance Use Topics  . Smoking status: Never Smoker   . Smokeless tobacco: Not on file  . Alcohol Use: Yes     Comment: occasionally   OB History    No data available     Review of Systems  Constitutional: Negative for fever, chills and diaphoresis.  Respiratory: Positive for cough and shortness of breath.   Cardiovascular: Positive for chest pain.  Gastrointestinal: Negative for nausea and vomiting.  All other systems reviewed and are negative.     Allergies  Imitrex; Yellow jacket venom; Ibuprofen; and Penicillins  Home Medications   Prior to Admission  medications   Medication Sig Start Date End Date Taking? Authorizing Provider  acetaminophen (TYLENOL) 500 MG tablet Take 1,000-1,500 mg by mouth every 6 (six) hours as needed. Pain    Historical Provider, MD  bacitracin ointment Apply 1 application topically 2 (two) times daily. Patient not taking: Reported on 08/31/2014 06/15/14   Mercedes Camprubi-Soms, PA-C  ciprofloxacin (CIPRO) 750 MG tablet Take 1 tablet (750 mg total) by mouth 2 (two) times daily. Patient not taking: Reported on 06/15/2014 05/30/14   Delos Haring, PA-C  clindamycin (CLEOCIN) 150 MG capsule Take 3 capsules (450 mg total) by mouth 3 (three) times daily. Patient not taking: Reported on 06/15/2014 05/30/14   Delos Haring, PA-C  cloNIDine (CATAPRES) 0.1 MG tablet Take 0.1 mg by mouth 2 (two) times daily.    Historical Provider, MD  cyclobenzaprine (FLEXERIL) 10 MG tablet Take 1 tablet (10 mg total) by mouth 3 (three) times daily as needed for muscle spasms. Patient not taking: Reported on 08/31/2014 06/15/14   Mercedes Camprubi-Soms, PA-C  diazepam (VALIUM) 5 MG tablet Take 1 tablet (5 mg total) by mouth 2 (two) times daily. Patient not taking: Reported on 06/15/2014 05/30/14   Delos Haring, PA-C  hydrochlorothiazide (HYDRODIURIL) 25 MG tablet Take 25 mg by mouth daily.    Historical Provider, MD  HYDROcodone-acetaminophen (NORCO) 5-325 MG per tablet Take 1-2 tablets by mouth every 6 (  six) hours as needed for severe pain. Patient not taking: Reported on 08/31/2014 06/15/14   Mercedes Camprubi-Soms, PA-C  HYDROcodone-acetaminophen (NORCO/VICODIN) 5-325 MG per tablet Take 1 tablet by mouth every 4 (four) hours as needed for moderate pain or severe pain. Patient not taking: Reported on 06/15/2014 05/31/14   Clayton Bibles, PA-C  HYDROcodone-homatropine [facility]) 5-1.5 MG/5ML syrup Take 5 mLs by mouth every 6 (six) hours as needed for cough. 06/27/14   Heather Laisure, PA-C  losartan-hydrochlorothiazide (HYZAAR) 100-25 MG per tablet Take 1  tablet by mouth 2 (two) times daily.    Historical Provider, MD  methyldopa (ALDOMET) 250 MG tablet Take 250 mg by mouth 3 (three) times daily.    Historical Provider, MD  naphazoline-glycerin (CLEAR EYES) 0.012-0.2 % SOLN Place 1-2 drops into both eyes every 4 (four) hours as needed for irritation.    Historical Provider, MD  naproxen (NAPROSYN) 500 MG tablet Take 1 tablet (500 mg total) by mouth 2 (two) times daily as needed for mild pain, moderate pain or headache (TAKE WITH MEALS.). Patient not taking: Reported on 08/31/2014 06/15/14   Mercedes Camprubi-Soms, PA-C  ondansetron (ZOFRAN) 4 MG tablet Take 1 tablet (4 mg total) by mouth every 6 (six) hours. Patient not taking: Reported on 06/15/2014 05/30/14   Delos Haring, PA-C  promethazine (PHENERGAN) 25 MG tablet Take 1 tablet (25 mg total) by mouth every 6 (six) hours as needed for nausea or vomiting. Patient not taking: Reported on 06/15/2014 01/30/14   Francine Graven, DO  traMADol (ULTRAM) 50 MG tablet Take 1 tablet (50 mg total) by mouth every 6 (six) hours as needed. Patient not taking: Reported on 06/15/2014 05/30/14   Delos Haring, PA-C   Triage Vitals: BP 156/94 mmHg  Pulse 92  Temp(Src) 98.7 F (37.1 C) (Oral)  Resp 21  SpO2 98%  LMP 08/16/2014   Physical Exam  Constitutional: She is oriented to person, place, and time. She appears well-developed and well-nourished. No distress.  HENT:  Head: Normocephalic and atraumatic.  Eyes: EOM are normal.  Neck: Normal range of motion.  Cardiovascular: Normal rate, regular rhythm and normal heart sounds.   Pulmonary/Chest: Effort normal and breath sounds normal.  Abdominal: Soft. She exhibits no distension. There is no tenderness.  Musculoskeletal: Normal range of motion.  Neurological: She is alert and oriented to person, place, and time.  Skin: Skin is warm and dry.  Psychiatric: She has a normal mood and affect. Judgment normal.  Nursing note and vitals reviewed.   ED Course   Procedures (including critical care time)  DIAGNOSTIC STUDIES: Oxygen Saturation is 98% on RA, Normal by my interpretation.    COORDINATION OF CARE: 2:56 AM-Discussed treatment plan with pt at bedside and pt agreed to plan.     Labs Review Results for orders placed or performed during the hospital encounter of 93/26/71  Basic metabolic panel  Result Value Ref Range   Sodium 137 135 - 145 mmol/L   Potassium 3.7 3.5 - 5.1 mmol/L   Chloride 105 96 - 112 mmol/L   CO2 25 19 - 32 mmol/L   Glucose, Bld 109 (H) 70 - 99 mg/dL   BUN 11 6 - 23 mg/dL   Creatinine, Ser 0.94 0.50 - 1.10 mg/dL   Calcium 8.6 8.4 - 10.5 mg/dL   GFR calc non Af Amer 75 (L) >90 mL/min   GFR calc Af Amer 87 (L) >90 mL/min   Anion gap 7 5 - 15  BNP (order ONLY if patient  complains of dyspnea/SOB AND you have documented it for THIS visit)  Result Value Ref Range   B Natriuretic Peptide 113.3 (H) 0.0 - 100.0 pg/mL  CBC with Differential  Result Value Ref Range   WBC 11.9 (H) 4.0 - 10.5 K/uL   RBC 4.56 3.87 - 5.11 MIL/uL   Hemoglobin 12.6 12.0 - 15.0 g/dL   HCT 36.6 36.0 - 46.0 %   MCV 80.3 78.0 - 100.0 fL   MCH 27.6 26.0 - 34.0 pg   MCHC 34.4 30.0 - 36.0 g/dL   RDW 14.0 11.5 - 15.5 %   Platelets 387 150 - 400 K/uL   Neutrophils Relative % 61 43 - 77 %   Neutro Abs 7.1 1.7 - 7.7 K/uL   Lymphocytes Relative 31 12 - 46 %   Lymphs Abs 3.7 0.7 - 4.0 K/uL   Monocytes Relative 6 3 - 12 %   Monocytes Absolute 0.7 0.1 - 1.0 K/uL   Eosinophils Relative 2 0 - 5 %   Eosinophils Absolute 0.3 0.0 - 0.7 K/uL   Basophils Relative 0 0 - 1 %   Basophils Absolute 0.1 0.0 - 0.1 K/uL  Troponin I  Result Value Ref Range   Troponin I <0.03 <0.031 ng/mL  POC Urine Pregnancy, ED (do NOT order at [facility])  Result Value Ref Range   Preg Test, Ur NEGATIVE NEGATIVE    Imaging Review Dg Chest 2 View  09/08/2014   CLINICAL DATA:  Right-sided chest pain with inspiration.  EXAM: CHEST  2 VIEW  COMPARISON:  06/27/2014  FINDINGS: Normal  heart size and mediastinal contours. Mildly low lung volumes. No acute infiltrate or edema. No effusion or pneumothorax. No acute osseous findings.  IMPRESSION: No active cardiopulmonary disease.   Electronically Signed   By: Monte Fantasia M.D.   On: 09/08/2014 02:43     EKG Interpretation   Date/Time:  Thursday September 08 2014 01:38:22 EDT Ventricular Rate:  99 PR Interval:  156 QRS Duration: 86 QT Interval:  348 QTC Calculation: 446 R Axis:   -4 Text Interpretation:  Normal sinus rhythm Septal infarct , age  undetermined Abnormal ECG When compared with ECG of 08/31/2014, ST  depression in Inferior leads is no longer Present Confirmed by [facility]  MD,  Saamir Armstrong ([id number]) on 09/08/2014 1:41:42 AM      MDM   Final diagnoses:  Pleurisy    Pleuritic chest pain which occurred shortly after I of viral illness which was possibly influenza. Clinically, this is pleurisy. WBC is normal and chest x-ray shows no evidence of pneumonia. She cannot take NSAIDs, so she is given a dose of dexamethasone in the ED and is sent home with prescription for oxycodone-acetaminophen.  I personally performed the services described in this documentation, which was scribed in my presence. The recorded information has been reviewed and is accurate.      Delora Fuel, MD 39/03/00 9233

## 2014-09-08 NOTE — ED Notes (Signed)
Per Roxanne Mins, MD pt is not to be admitted at this time. Bed control notified.

## 2014-09-08 NOTE — ED Notes (Signed)
Roxanne Mins, MD at bedside.

## 2014-09-08 NOTE — ED Notes (Signed)
The pt is c/o rt upper chest pain since 1600 with sob.  lmp march 15th

## 2014-09-08 NOTE — Discharge Instructions (Signed)
Pleurisy Pleurisy is an inflammation and swelling of the lining of the lungs (pleura). Because of this inflammation, it hurts to breathe. It can be aggravated by coughing, laughing, or deep breathing. Pleurisy is often caused by an underlying infection or disease.  HOME CARE INSTRUCTIONS  Monitor your pleurisy for any changes. The following actions may help to alleviate any discomfort you are experiencing:  Medicine may help with pain. Only take over-the-counter or prescription medicines for pain, discomfort, or fever as directed by your health care provider.  Only take antibiotic medicine as directed. Make sure to finish it even if you start to feel better. SEEK MEDICAL CARE IF:   Your pain is not controlled with medicine or is increasing.  You have an increase in pus-like (purulent) secretions brought up with coughing. SEEK IMMEDIATE MEDICAL CARE IF:   You have blue or dark lips, fingernails, or toenails.  You are coughing up blood.  You have increased difficulty breathing.  You have continuing pain unrelieved by medicine or pain lasting more than 1 week.  You have pain that radiates into your neck, arms, or jaw.  You develop increased shortness of breath or wheezing.  You develop a fever, rash, vomiting, fainting, or other serious symptoms. MAKE SURE YOU:  Understand these instructions.   Will watch your condition.   Will get help right away if you are not doing well or get worse.  Document Released: 05/20/2005 Document Revised: 01/20/2013 Document Reviewed: 11/01/2012 [facility] Patient Information 2015 [place], [place]. This information is not intended to replace advice given to you by your health care provider. Make sure you discuss any questions you have with your health care provider.  Acetaminophen; Oxycodone tablets What is this medicine? ACETAMINOPHEN; OXYCODONE (a set a MEE noe fen; ox i KOE done) is a pain reliever. It is used to treat mild to moderate  pain. This medicine may be used for other purposes; ask your health care provider or pharmacist if you have questions. COMMON BRAND NAME(S): Endocet, Magnacet, Narvox, Percocet, Perloxx, Primalev, Primlev, Roxicet, Xolox What should I tell my health care provider before I take this medicine? They need to know if you have any of these conditions: -brain tumor -Crohn's disease, inflammatory bowel disease, or ulcerative colitis -drug abuse or addiction -head injury -heart or circulation problems -if you often drink alcohol -kidney disease or problems going to the bathroom -liver disease -lung disease, asthma, or breathing problems -an unusual or allergic reaction to acetaminophen, oxycodone, other opioid analgesics, other medicines, foods, dyes, or preservatives -pregnant or trying to get pregnant -breast-feeding How should I use this medicine? Take this medicine by mouth with a full glass of water. Follow the directions on the prescription label. Take your medicine at regular intervals. Do not take your medicine more often than directed. Talk to your pediatrician regarding the use of this medicine in children. Special care may be needed. Patients over 68 years old may have a stronger reaction and need a smaller dose. Overdosage: If you think you have taken too much of this medicine contact a poison control center or emergency room at once. NOTE: This medicine is only for you. Do not share this medicine with others. What if I miss a dose? If you miss a dose, take it as soon as you can. If it is almost time for your next dose, take only that dose. Do not take double or extra doses. What may interact with this medicine? -alcohol -antihistamines -barbiturates like amobarbital, butalbital, butabarbital, methohexital,  pentobarbital, phenobarbital, thiopental, and secobarbital -benztropine -drugs for bladder problems like solifenacin, trospium, oxybutynin, tolterodine, hyoscyamine, and  methscopolamine -drugs for breathing problems like ipratropium and tiotropium -drugs for certain stomach or intestine problems like propantheline, homatropine methylbromide, glycopyrrolate, atropine, belladonna, and dicyclomine -general anesthetics like etomidate, ketamine, nitrous oxide, propofol, desflurane, enflurane, halothane, isoflurane, and sevoflurane -medicines for depression, anxiety, or psychotic disturbances -medicines for sleep -muscle relaxants -naltrexone -narcotic medicines (opiates) for pain -phenothiazines like perphenazine, thioridazine, chlorpromazine, mesoridazine, fluphenazine, prochlorperazine, promazine, and trifluoperazine -scopolamine -tramadol -trihexyphenidyl This list may not describe all possible interactions. Give your health care provider a list of all the medicines, herbs, non-prescription drugs, or dietary supplements you use. Also tell them if you smoke, drink alcohol, or use illegal drugs. Some items may interact with your medicine. What should I watch for while using this medicine? Tell your doctor or health care professional if your pain does not go away, if it gets worse, or if you have new or a different type of pain. You may develop tolerance to the medicine. Tolerance means that you will need a higher dose of the medication for pain relief. Tolerance is normal and is expected if you take this medicine for a long time. Do not suddenly stop taking your medicine because you may develop a severe reaction. Your body becomes used to the medicine. This does NOT mean you are addicted. Addiction is a behavior related to getting and using a drug for a non-medical reason. If you have pain, you have a medical reason to take pain medicine. Your doctor will tell you how much medicine to take. If your doctor wants you to stop the medicine, the dose will be slowly lowered over time to avoid any side effects. You may get drowsy or dizzy. Do not drive, use machinery, or do  anything that needs mental alertness until you know how this medicine affects you. Do not stand or sit up quickly, especially if you are an older patient. This reduces the risk of dizzy or fainting spells. Alcohol may interfere with the effect of this medicine. Avoid alcoholic drinks. There are different types of narcotic medicines (opiates) for pain. If you take more than one type at the same time, you may have more side effects. Give your health care provider a list of all medicines you use. Your doctor will tell you how much medicine to take. Do not take more medicine than directed. Call emergency for help if you have problems breathing. The medicine will cause constipation. Try to have a bowel movement at least every 2 to 3 days. If you do not have a bowel movement for 3 days, call your doctor or health care professional. Do not take Tylenol (acetaminophen) or medicines that have acetaminophen with this medicine. Too much acetaminophen can be very dangerous. Many nonprescription medicines contain acetaminophen. Always read the labels carefully to avoid taking more acetaminophen. What side effects may I notice from receiving this medicine? Side effects that you should report to your doctor or health care professional as soon as possible: -allergic reactions like skin rash, itching or hives, swelling of the face, lips, or tongue -breathing difficulties, wheezing -confusion -light headedness or fainting spells -severe stomach pain -unusually weak or tired -yellowing of the skin or the whites of the eyes Side effects that usually do not require medical attention (report to your doctor or health care professional if they continue or are bothersome): -dizziness -drowsiness -nausea -vomiting This list may not describe all possible side effects.  Call your doctor for medical advice about side effects. You may report side effects to FDA at 1-800-FDA-1088. Where should I keep my medicine? Keep out of  the reach of children. This medicine can be abused. Keep your medicine in a safe place to protect it from theft. Do not share this medicine with anyone. Selling or giving away this medicine is dangerous and against the law. Store at room temperature between 20 and 25 degrees C (68 and 77 degrees F). Keep container tightly closed. Protect from light. This medicine may cause accidental overdose and death if it is taken by other adults, children, or pets. Flush any unused medicine down the toilet to reduce the chance of harm. Do not use the medicine after the expiration date. NOTE: This sheet is a summary. It may not cover all possible information. If you have questions about this medicine, talk to your doctor, pharmacist, or health care provider.  2015, [employer]. (2013-01-11 13:17:35)

## 2014-12-03 ENCOUNTER — Emergency Department (HOSPITAL_COMMUNITY)
Admission: EM | Admit: 2014-12-03 | Discharge: 2014-12-03 | Disposition: A | Discharge status: Home / Self Care | Payer: Managed Care, Other (non HMO) | Attending: Emergency Medicine | Admitting: Emergency Medicine

## 2014-12-03 ENCOUNTER — Emergency Department (HOSPITAL_COMMUNITY): Payer: Managed Care, Other (non HMO)

## 2014-12-03 ENCOUNTER — Encounter (HOSPITAL_COMMUNITY): Payer: Self-pay | Admitting: Nurse Practitioner

## 2014-12-03 DIAGNOSIS — D259 Leiomyoma of uterus, unspecified: Secondary | ICD-10-CM | POA: Diagnosis not present

## 2014-12-03 DIAGNOSIS — N939 Abnormal uterine and vaginal bleeding, unspecified: Secondary | ICD-10-CM | POA: Diagnosis not present

## 2014-12-03 DIAGNOSIS — Z79899 Other long term (current) drug therapy: Secondary | ICD-10-CM | POA: Diagnosis not present

## 2014-12-03 DIAGNOSIS — I10 Essential (primary) hypertension: Secondary | ICD-10-CM | POA: Insufficient documentation

## 2014-12-03 DIAGNOSIS — Z880 Allergy status to penicillin: Secondary | ICD-10-CM | POA: Insufficient documentation

## 2014-12-03 DIAGNOSIS — R102 Pelvic and perineal pain unspecified side: Secondary | ICD-10-CM

## 2014-12-03 DIAGNOSIS — G43909 Migraine, unspecified, not intractable, without status migrainosus: Secondary | ICD-10-CM | POA: Insufficient documentation

## 2014-12-03 DIAGNOSIS — Z3202 Encounter for pregnancy test, result negative: Secondary | ICD-10-CM | POA: Insufficient documentation

## 2014-12-03 LAB — URINALYSIS, ROUTINE W REFLEX MICROSCOPIC
Bilirubin Urine: NEGATIVE
Glucose, UA: NEGATIVE mg/dL
HGB URINE DIPSTICK: NEGATIVE
Ketones, ur: NEGATIVE mg/dL
Leukocytes, UA: NEGATIVE
Nitrite: NEGATIVE
Protein, ur: NEGATIVE mg/dL
Specific Gravity, Urine: 1.014 (ref 1.005–1.030)
UROBILINOGEN UA: 0.2 mg/dL (ref 0.0–1.0)
pH: 5.5 (ref 5.0–8.0)

## 2014-12-03 LAB — COMPREHENSIVE METABOLIC PANEL
ALT: 17 U/L (ref 14–54)
AST: 18 U/L (ref 15–41)
Albumin: 3.5 g/dL (ref 3.5–5.0)
Alkaline Phosphatase: 58 U/L (ref 38–126)
Anion gap: 9 (ref 5–15)
BUN: 12 mg/dL (ref 6–20)
CO2: 28 mmol/L (ref 22–32)
Calcium: 9.2 mg/dL (ref 8.9–10.3)
Chloride: 101 mmol/L (ref 101–111)
Creatinine, Ser: 1.06 mg/dL — ABNORMAL HIGH (ref 0.44–1.00)
GFR calc Af Amer: >60 mL/min (ref >60)
GLUCOSE: 117 mg/dL — AB (ref 65–99)
Potassium: 3.4 mmol/L — ABNORMAL LOW (ref 3.5–5.1)
Sodium: 138 mmol/L (ref 135–145)
Total Bilirubin: 0.2 mg/dL — ABNORMAL LOW (ref 0.3–1.2)
Total Protein: 7.5 g/dL (ref 6.5–8.1)

## 2014-12-03 LAB — CBC
HCT: 36.9 % (ref 36.0–46.0)
Hemoglobin: 12.6 g/dL (ref 12.0–15.0)
MCH: 27.5 pg (ref 26.0–34.0)
MCHC: 34.1 g/dL (ref 30.0–36.0)
MCV: 80.6 fL (ref 78.0–100.0)
Platelets: 457 10*3/uL — ABNORMAL HIGH (ref 150–400)
RBC: 4.58 MIL/uL (ref 3.87–5.11)
RDW: 14.1 % (ref 11.5–15.5)
WBC: 17.9 10*3/uL — ABNORMAL HIGH (ref 4.0–10.5)

## 2014-12-03 LAB — WET PREP, GENITAL
Clue Cells Wet Prep HPF POC: NONE SEEN
Trich, Wet Prep: NONE SEEN
Yeast Wet Prep HPF POC: NONE SEEN

## 2014-12-03 LAB — POC URINE PREG, ED: PREG TEST UR: NEGATIVE

## 2014-12-03 NOTE — Discharge Instructions (Signed)
Ibuprofen or Aleve for pain. Follow-up with your OB/GYN doctor. Your blood work, urinalysis, ultrasound did not show any life-threatening findings. He did have fibroids on a uterus. No other significant findings.  Fibroids Fibroids are lumps (tumors) that can occur any place in a woman's body. These lumps are not cancerous. Fibroids vary in size, weight, and where they grow. HOME CARE  Do not take aspirin.  Write down the number of pads or tampons you use during your period. Tell your doctor. This can help determine the best treatment for you. GET HELP RIGHT AWAY IF:  You have pain in your lower belly (abdomen) that is not helped with medicine.  You have cramps that are not helped with medicine.  You have more bleeding between or during your period.  You feel lightheaded or pass out (faint).  Your lower belly pain gets worse. MAKE SURE YOU:  Understand these instructions.  Will watch your condition.  Will get help right away if you are not doing well or get worse. Document Released: 06/22/2010 Document Revised: 08/12/2011 Document Reviewed: 06/22/2010 [facility] Patient Information 2015 [place], [place]. This information is not intended to replace advice given to you by your health care provider. Make sure you discuss any questions you have with your health care provider.

## 2014-12-03 NOTE — ED Notes (Signed)
Pt given a gown, pt aware she may need to be fully undressed due to pelvic pain. Pt becomes agitated and states "my doctor said we will just do labs in the ED and I will speak to the ED provider about a pelvic exam."

## 2014-12-03 NOTE — ED Notes (Signed)
This RN went in to room to meet patient.  Patient in [place].

## 2014-12-03 NOTE — ED Notes (Signed)
Patient transported to Ultrasound 

## 2014-12-03 NOTE — ED Notes (Signed)
She c/o irregular vaginal bleeding onset yesterday, R sided pelvic and lower abd pain onset today. Her PCP referred her to ED for evaluation, they were concerned because she is on clomid.  She reports last normal period was 6/15. She is HTN in triage, reports taking BP meds as directed

## 2014-12-03 NOTE — ED Notes (Signed)
Pt reports pain to RLQ abdomen that began earlier today.  Does not appear to be in acute distress.  States she is on clomid, LMP 11/18/14, began spotting now, mostly on tissue when wiping.  Called PMD, told her to come to ED to be checked for cyst or ovarian torsion.

## 2014-12-03 NOTE — ED Provider Notes (Signed)
CSN: [id number]     Arrival date & time 12/03/14  1247 History   First MD Initiated Contact with Patient 12/03/14 1401     Chief Complaint  Patient presents with  . Pelvic Pain     (Consider location/radiation/quality/duration/timing/severity/associated sxs/prior Treatment) HPI Annette Deleon is a 39 y.o. female with history of hypertension, pseudotumor cerebri, migraines, presents to emergency department complaining of right lower abdominal pain, vaginal bleeding. Patient is currently on Clomid, states she is trying to get pregnant. Last period was 2 weeks ago, started Clomid 5 days after her cycle. States this morning developed sudden onset of right adnexal pain. She also states she started having vaginal bleeding which is unusual for her. She denies any nausea or vomiting. Denies any dizziness. No back pain. No urinary symptoms. She states she called her doctor who is a [place], who told her to get evaluated emergency department. Patient also admits to being on prednisone. She states "my doctor told me he will make me believe more." Patient also complaining of episode of palpitations yesterday which has resolved since. No chest pain or shortness of breath.   Past Medical History  Diagnosis Date  . Hypertension   . Pseudotumor cerebri   . Migraines    Past Surgical History  Procedure Laterality Date  . Cesarean section    . Cholecystectomy    . Myomectomy    . Knee surgery      right   History reviewed. No pertinent family history. History  Substance Use Topics  . Smoking status: Never Smoker   . Smokeless tobacco: Not on file  . Alcohol Use: Yes     Comment: occasionally   OB History    No data available     Review of Systems  Constitutional: Negative for fever and chills.  Respiratory: Negative for cough, chest tightness and shortness of breath.   Cardiovascular: Negative for chest pain, palpitations and leg swelling.  Gastrointestinal: Positive for abdominal pain.  Negative for nausea, vomiting and diarrhea.  Genitourinary: Positive for vaginal bleeding and pelvic pain. Negative for dysuria, flank pain, vaginal discharge and vaginal pain.  Musculoskeletal: Negative for myalgias, arthralgias, neck pain and neck stiffness.  Skin: Negative for rash.  Neurological: Negative for dizziness, weakness and headaches.  All other systems reviewed and are negative.     Allergies  Imitrex; Yellow jacket venom; Ibuprofen; and Penicillins  Home Medications   Prior to Admission medications   Medication Sig Start Date End Date Taking? Authorizing Provider  acetaminophen (TYLENOL) 500 MG tablet Take 1,000-1,500 mg by mouth every 6 (six) hours as needed. Pain    Historical Provider, MD  bacitracin ointment Apply 1 application topically 2 (two) times daily. Patient not taking: Reported on 08/31/2014 06/15/14   Mercedes Camprubi-Soms, PA-C  ciprofloxacin (CIPRO) 750 MG tablet Take 1 tablet (750 mg total) by mouth 2 (two) times daily. Patient not taking: Reported on 06/15/2014 05/30/14   Delos Haring, PA-C  clindamycin (CLEOCIN) 150 MG capsule Take 3 capsules (450 mg total) by mouth 3 (three) times daily. Patient not taking: Reported on 06/15/2014 05/30/14   Delos Haring, PA-C  cloNIDine (CATAPRES) 0.1 MG tablet Take 0.1 mg by mouth 2 (two) times daily.    Historical Provider, MD  cyclobenzaprine (FLEXERIL) 10 MG tablet Take 1 tablet (10 mg total) by mouth 3 (three) times daily as needed for muscle spasms. Patient not taking: Reported on 08/31/2014 06/15/14   Mercedes Camprubi-Soms, PA-C  diazepam (VALIUM) 5 MG tablet Take  1 tablet (5 mg total) by mouth 2 (two) times daily. Patient not taking: Reported on 06/15/2014 05/30/14   Delos Haring, PA-C  hydrochlorothiazide (HYDRODIURIL) 25 MG tablet Take 25 mg by mouth daily.    Historical Provider, MD  HYDROcodone-acetaminophen (NORCO) 5-325 MG per tablet Take 1-2 tablets by mouth every 6 (six) hours as needed for severe  pain. Patient not taking: Reported on 08/31/2014 06/15/14   Mercedes Camprubi-Soms, PA-C  HYDROcodone-acetaminophen (NORCO/VICODIN) 5-325 MG per tablet Take 1 tablet by mouth every 4 (four) hours as needed for moderate pain or severe pain. Patient not taking: Reported on 06/15/2014 05/31/14   Clayton Bibles, PA-C  HYDROcodone-homatropine [facility]) 5-1.5 MG/5ML syrup Take 5 mLs by mouth every 6 (six) hours as needed for cough. Patient not taking: Reported on 09/08/2014 06/27/14   Hyman Bible, PA-C  losartan-hydrochlorothiazide (HYZAAR) 100-25 MG per tablet Take 1 tablet by mouth 2 (two) times daily.    Historical Provider, MD  methyldopa (ALDOMET) 250 MG tablet Take 250 mg by mouth 3 (three) times daily.    Historical Provider, MD  naphazoline-glycerin (CLEAR EYES) 0.012-0.2 % SOLN Place 1-2 drops into both eyes every 4 (four) hours as needed for irritation.    Historical Provider, MD  naproxen (NAPROSYN) 500 MG tablet Take 1 tablet (500 mg total) by mouth 2 (two) times daily as needed for mild pain, moderate pain or headache (TAKE WITH MEALS.). Patient not taking: Reported on 08/31/2014 06/15/14   Mercedes Camprubi-Soms, PA-C  ondansetron (ZOFRAN) 4 MG tablet Take 1 tablet (4 mg total) by mouth every 6 (six) hours. Patient not taking: Reported on 06/15/2014 05/30/14   Delos Haring, PA-C  oxyCODONE-acetaminophen (PERCOCET) 5-325 MG per tablet Take 1 tablet by mouth every 4 (four) hours as needed for moderate pain. 11/08/09   Delora Fuel, MD  promethazine (PHENERGAN) 25 MG tablet Take 1 tablet (25 mg total) by mouth every 6 (six) hours as needed for nausea or vomiting. Patient not taking: Reported on 06/15/2014 01/30/14   Francine Graven, DO  traMADol (ULTRAM) 50 MG tablet Take 1 tablet (50 mg total) by mouth every 6 (six) hours as needed. Patient not taking: Reported on 06/15/2014 05/30/14   Delos Haring, PA-C   BP 200/123 mmHg  Pulse 89  Temp(Src) 98 F (36.7 C) (Oral)  Resp 18  SpO2 100% Physical  Exam  Constitutional: She is oriented to person, place, and time. She appears well-developed and well-nourished. No distress.  HENT:  Head: Normocephalic.  Eyes: Conjunctivae are normal.  Neck: Neck supple.  Cardiovascular: Normal rate, regular rhythm and normal heart sounds.   Pulmonary/Chest: Effort normal and breath sounds normal. No respiratory distress. She has no wheezes. She has no rales.  Abdominal: Soft. Bowel sounds are normal. She exhibits no distension. There is tenderness. There is no rebound.  Right lower abdominal tenderness  Genitourinary:  Normal external genitalia. Normal vaginal canal. Small blood in vaginal canal.  Cervix is normal, closed. No CMT. No uterine tenderness. Right adnexal tenderness. No masses palpated.    Musculoskeletal: She exhibits no edema.  Neurological: She is alert and oriented to person, place, and time.  Skin: Skin is warm and dry.  Psychiatric: She has a normal mood and affect. Her behavior is normal.  Nursing note and vitals reviewed.   ED Course  Procedures (including critical care time) Labs Review Labs Reviewed  WET PREP, GENITAL - Abnormal; Notable for the following:    WBC, Wet Prep HPF POC FEW (*)  All other components within normal limits  CBC - Abnormal; Notable for the following:    WBC 17.9 (*)    Platelets 457 (*)    All other components within normal limits  COMPREHENSIVE METABOLIC PANEL - Abnormal; Notable for the following:    Potassium 3.4 (*)    Glucose, Bld 117 (*)    Creatinine, Ser 1.06 (*)    Total Bilirubin 0.2 (*)    All other components within normal limits  URINALYSIS, ROUTINE W REFLEX MICROSCOPIC (NOT AT [facility])  POC URINE PREG, ED  GC/CHLAMYDIA PROBE AMP ([place]) NOT AT [facility]    Imaging Review US Transvaginal Non-ob  12/03/2014   CLINICAL DATA:  Right lower quadrant abdominal pain. Fibroid surgery 2010.  EXAM: TRANSABDOMINAL AND TRANSVAGINAL ULTRASOUND OF PELVIS  DOPPLER ULTRASOUND OF OVARIES   TECHNIQUE: Both transabdominal and transvaginal ultrasound examinations of the pelvis were performed. Transabdominal technique was performed for global imaging of the pelvis including uterus, ovaries, adnexal regions, and pelvic cul-de-sac.  It was necessary to proceed with endovaginal exam following the transabdominal exam to visualize the ovaries and uterus. Color and duplex Doppler ultrasound was utilized to evaluate blood flow to the ovaries.  COMPARISON:  None.  FINDINGS: Uterus  Measurements: 10.2 x 6.8 x 6.5 cm. Multiple uterine fibroids are present. A posterior sub serosal fibroid measures 2.6 x 1.9 x 2.7 cm. Intramural fibroids anteriorly and posteriorly measure 2.3 x 1.6 x 1.9 cm in 2.4 x 2.2 x 2.0 cm respectively. No fibroids or other mass visualized.  Endometrium  Thickness: 14 mm, upper limits of normal. No focal abnormality visualized.  Right ovary  Measurements: 4.3 x 2.6 x 3.5 cm, within normal limits. Normal appearance/no adnexal mass.  Left ovary  Measurements: 3.3 x 2.4 x 3.0 cm, within normal limits. Normal appearance/no adnexal mass.  Pulsed Doppler evaluation of both ovaries demonstrates normal low-resistance arterial and venous waveforms.  Other findings  No free fluid is present.  IMPRESSION: 1. Multiple uterine fibroids are demonstrated as above, both intramural and sub serosal 2. The ovaries are within normal limits bilaterally. Normal arterial and venous waveforms are documented   Electronically Signed   By: San Morelle M.D.   On: 12/03/2014 15:54   US Pelvis Complete  12/03/2014   CLINICAL DATA:  Right lower quadrant abdominal pain. Fibroid surgery 2010.  EXAM: TRANSABDOMINAL AND TRANSVAGINAL ULTRASOUND OF PELVIS  DOPPLER ULTRASOUND OF OVARIES  TECHNIQUE: Both transabdominal and transvaginal ultrasound examinations of the pelvis were performed. Transabdominal technique was performed for global imaging of the pelvis including uterus, ovaries, adnexal regions, and pelvic  cul-de-sac.  It was necessary to proceed with endovaginal exam following the transabdominal exam to visualize the ovaries and uterus. Color and duplex Doppler ultrasound was utilized to evaluate blood flow to the ovaries.  COMPARISON:  None.  FINDINGS: Uterus  Measurements: 10.2 x 6.8 x 6.5 cm. Multiple uterine fibroids are present. A posterior sub serosal fibroid measures 2.6 x 1.9 x 2.7 cm. Intramural fibroids anteriorly and posteriorly measure 2.3 x 1.6 x 1.9 cm in 2.4 x 2.2 x 2.0 cm respectively. No fibroids or other mass visualized.  Endometrium  Thickness: 14 mm, upper limits of normal. No focal abnormality visualized.  Right ovary  Measurements: 4.3 x 2.6 x 3.5 cm, within normal limits. Normal appearance/no adnexal mass.  Left ovary  Measurements: 3.3 x 2.4 x 3.0 cm, within normal limits. Normal appearance/no adnexal mass.  Pulsed Doppler evaluation of both ovaries demonstrates normal low-resistance arterial and  venous waveforms.  Other findings  No free fluid is present.  IMPRESSION: 1. Multiple uterine fibroids are demonstrated as above, both intramural and sub serosal 2. The ovaries are within normal limits bilaterally. Normal arterial and venous waveforms are documented   Electronically Signed   By: San Morelle M.D.   On: 12/03/2014 15:54   [place] Art/ven Flow Abd Pelv Doppler  12/03/2014   CLINICAL DATA:  Right lower quadrant abdominal pain. Fibroid surgery 2010.  EXAM: TRANSABDOMINAL AND TRANSVAGINAL ULTRASOUND OF PELVIS  DOPPLER ULTRASOUND OF OVARIES  TECHNIQUE: Both transabdominal and transvaginal ultrasound examinations of the pelvis were performed. Transabdominal technique was performed for global imaging of the pelvis including uterus, ovaries, adnexal regions, and pelvic cul-de-sac.  It was necessary to proceed with endovaginal exam following the transabdominal exam to visualize the ovaries and uterus. Color and duplex Doppler ultrasound was utilized to evaluate blood flow to the ovaries.   COMPARISON:  None.  FINDINGS: Uterus  Measurements: 10.2 x 6.8 x 6.5 cm. Multiple uterine fibroids are present. A posterior sub serosal fibroid measures 2.6 x 1.9 x 2.7 cm. Intramural fibroids anteriorly and posteriorly measure 2.3 x 1.6 x 1.9 cm in 2.4 x 2.2 x 2.0 cm respectively. No fibroids or other mass visualized.  Endometrium  Thickness: 14 mm, upper limits of normal. No focal abnormality visualized.  Right ovary  Measurements: 4.3 x 2.6 x 3.5 cm, within normal limits. Normal appearance/no adnexal mass.  Left ovary  Measurements: 3.3 x 2.4 x 3.0 cm, within normal limits. Normal appearance/no adnexal mass.  Pulsed Doppler evaluation of both ovaries demonstrates normal low-resistance arterial and venous waveforms.  Other findings  No free fluid is present.  IMPRESSION: 1. Multiple uterine fibroids are demonstrated as above, both intramural and sub serosal 2. The ovaries are within normal limits bilaterally. Normal arterial and venous waveforms are documented   Electronically Signed   By: San Morelle M.D.   On: 12/03/2014 15:54     EKG Interpretation None      MDM   Final diagnoses:  Adnexal pain  Abnormal vaginal bleeding  Uterine leiomyoma, unspecified location     patient with right adnexal pain and tenderness. Currently on Clomid. Complaining of irregular vaginal bleeding as well. Pelvic exam unremarkable other than right adnexal tenderness. Will get labs, UA, [place] to ro ovarian cyst vs ovarian torsion  4:05 PM Ultrasound is negative except for fibroids. By patient's blood pressure is elevated, she states she is aware and will follow with her primary care doctor. She is currently on blood pressure medications. Patient with leukocytosis, she is on prednisone right now for sinus infection, most likely the cause. She is afebrile, nontoxic appearing. Stable for discharge. Will start on NSAIDs, follow up with primary care doctor and OB/GYN  Filed Vitals:   12/03/14 1304 12/03/14 1430  12/03/14 1539  BP: 200/123 173/103 154/96  Pulse: 89 73 64  Temp: 98 F (36.7 C)    TempSrc: Oral    Resp: 18  20  SpO2: 100% 99% 99%     Jeannett Senior, PA-C 12/04/14 [place], MD 12/08/14 [phone]

## 2014-12-03 NOTE — ED Notes (Signed)
Attempted to discharge patient.  Patient in the middle of getting dressed and asked this RN to come back.

## 2014-12-06 LAB — GC/CHLAMYDIA PROBE AMP (~~LOC~~) NOT AT ARMC
Chlamydia: NEGATIVE
Neisseria Gonorrhea: NEGATIVE

## 2014-12-29 ENCOUNTER — Emergency Department (HOSPITAL_COMMUNITY): Payer: Managed Care, Other (non HMO)

## 2014-12-29 ENCOUNTER — Encounter (HOSPITAL_COMMUNITY): Payer: Self-pay | Admitting: Nurse Practitioner

## 2014-12-29 ENCOUNTER — Emergency Department (HOSPITAL_COMMUNITY)
Admission: EM | Admit: 2014-12-29 | Discharge: 2014-12-29 | Disposition: A | Discharge status: Home / Self Care | Payer: Managed Care, Other (non HMO) | Attending: Emergency Medicine | Admitting: Emergency Medicine

## 2014-12-29 DIAGNOSIS — Z8669 Personal history of other diseases of the nervous system and sense organs: Secondary | ICD-10-CM | POA: Diagnosis not present

## 2014-12-29 DIAGNOSIS — Y9289 Other specified places as the place of occurrence of the external cause: Secondary | ICD-10-CM | POA: Insufficient documentation

## 2014-12-29 DIAGNOSIS — M25562 Pain in left knee: Secondary | ICD-10-CM

## 2014-12-29 DIAGNOSIS — G43909 Migraine, unspecified, not intractable, without status migrainosus: Secondary | ICD-10-CM | POA: Diagnosis not present

## 2014-12-29 DIAGNOSIS — W010XXA Fall on same level from slipping, tripping and stumbling without subsequent striking against object, initial encounter: Secondary | ICD-10-CM | POA: Diagnosis not present

## 2014-12-29 DIAGNOSIS — S8992XA Unspecified injury of left lower leg, initial encounter: Secondary | ICD-10-CM | POA: Diagnosis present

## 2014-12-29 DIAGNOSIS — Y998 Other external cause status: Secondary | ICD-10-CM | POA: Insufficient documentation

## 2014-12-29 DIAGNOSIS — Y9389 Activity, other specified: Secondary | ICD-10-CM | POA: Insufficient documentation

## 2014-12-29 DIAGNOSIS — Z79899 Other long term (current) drug therapy: Secondary | ICD-10-CM | POA: Diagnosis not present

## 2014-12-29 DIAGNOSIS — I10 Essential (primary) hypertension: Secondary | ICD-10-CM | POA: Diagnosis not present

## 2014-12-29 DIAGNOSIS — Z880 Allergy status to penicillin: Secondary | ICD-10-CM | POA: Diagnosis not present

## 2014-12-29 MED ORDER — HYDROCODONE-ACETAMINOPHEN 5-325 MG PO TABS: 1.0000 | ORAL_TABLET | Freq: Four times a day (QID) | ORAL | Status: DC | PRN

## 2014-12-29 MED ORDER — NAPROXEN 500 MG PO TABS: 500.0000 mg | ORAL_TABLET | Freq: Two times a day (BID) | ORAL | Status: DC

## 2014-12-29 MED ORDER — OXYCODONE-ACETAMINOPHEN 5-325 MG PO TABS
2.0000 | ORAL_TABLET | Freq: Once | ORAL | Status: AC
  Administered 2014-12-29: 2 via ORAL
  Filled 2014-12-29: qty 2
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
## 2014-12-29 NOTE — ED Notes (Signed)
She states she tripped and fell backwards on a step last night and heard a pop in her L leg. shes had pain in her L knee, calf and hip since. Cms intact.

## 2014-12-29 NOTE — Discharge Instructions (Signed)
Be sure to read and understand instructions below prior to leaving the hospital. If your symptoms persist without any improvement in 1 week it is reccommended that you follow up with orthopedics listed above. Use your pain medication as prescribed and do not operate heavy machinery while on pain medication. Note that your pain medication contains acetaminophen (Tylenol) & its is not recommended that you use additional acetaminophen (Tylenol) while taking this medication.  Knee Effusion  The medical term for having fluid in your knee is effusion.This means something is wrong inside the knee. Some of the causes of fluid in the knee may be torn cartilage, a torn ligament, or bleeding into the joint from an injury. Small tears may heal on their own with conservative treatment. Conservative means rest, limited weight bearing activity and muscle strengthening exercises. Your recovery may take up to 6 weeks. Larger tears may require surgery.   TREATMENT  Rest, ice, elevation, and compression are the basic modes of treatment.   Apply ice to the sore area for 15 to 20 minutes, 3 to 4 times per day. Do this while you are awake for the first 2 days, or as directed. This can be stopped when the swelling goes away. Put the ice in a plastic bag and place a towel between the bag of ice and your skin.  Keep your leg elevated when possible to lessen swelling.  If your caregiver recommends crutches, use them as instructed for 1 week. Then, you may walk as tolerated.  Do not drive a vehicle on pain medication. ACTIVITY:            - Weight bearing as tolerated            - Exercises should be limited to pain free range of motion  Knee Immobilization:: This is used to support and protect an injured or painful knee. Knee immobilizers keep your knee from being used while it is healing.  Use powder to control irritation from sweat and friction.  Adjust the immobilizer to be firm but not tight. Signs of an immobilizer that  is too tight include:   Swelling.   Numbness.   Color change in your foot or ankle.   Increased pain.  While resting, raise your leg above the level of your heart. This reduces throbbing and helps healing. Prop it up with pillows.  Remove the immobilizer to bathe and sleep. Wear it other times until you see your doctor again.               SEEK MEDICAL CARE IF:  You have an increase in bruising, swelling, or pain.  Your toes feel cold.  Pain relief is not achieved with medications.  EMERGENCY:: Your toes are numb or blue or you have severe pain.  You notice redness, swelling, warmth or increasing pain in your knee.  An unexplained oral temperature above 102 F (38.9 C) develops.  COLD THERAPY DIRECTIONS:  Ice or gel packs can be used to reduce both pain and swelling. Ice is the most helpful within the first 24 to 48 hours after an injury or flareup from overusing a muscle or joint.  Ice is effective, has very few side effects, and is safe for most people to use.   If you expose your skin to cold temperatures for too long or without the proper protection, you can damage your skin or nerves. Watch for signs of skin damage due to cold.   HOME CARE INSTRUCTIONS  Follow these  tips to use ice and cold packs safely.  Place a dry or damp towel between the ice and skin. A damp towel will cool the skin more quickly, so you may need to shorten the time that the ice is used.  For a more rapid response, add gentle compression to the ice.  Ice for no more than 10 to 20 minutes at a time. The bonier the area you are icing, the less time it will take to get the benefits of ice.  Check your skin after 5 minutes to make sure there are no signs of a poor response to cold or skin damage.  Rest 20 minutes or more in between uses.  Once your skin is numb, you can end your treatment. You can test numbness by very lightly touching your skin. The touch should be so light that you do not see the skin dimple from  the pressure of your fingertip. When using ice, most people will feel these normal sensations in this order: cold, burning, aching, and numbness.  Do not use ice on someone who cannot communicate their responses to pain, such as small children or people with dementia.   HOW TO MAKE AN ICE PACK  To make an ice pack, do one of the following:  Place crushed ice or a bag of frozen vegetables in a sealable plastic bag. Squeeze out the excess air. Place this bag inside another plastic bag. Slide the bag into a pillowcase or place a damp towel between your skin and the bag.  Mix 3 parts water with 1 part rubbing alcohol. Freeze the mixture in a sealable plastic bag. When you remove the mixture from the freezer, it will be slushy. Squeeze out the excess air. Place this bag inside another plastic bag. Slide the bag into a pillowcase or place a damp towel between your s

## 2015-03-08 ENCOUNTER — Ambulatory Visit (HOSPITAL_BASED_OUTPATIENT_CLINIC_OR_DEPARTMENT_OTHER): Payer: Managed Care, Other (non HMO) | Attending: Neurology | Admitting: Radiology

## 2015-03-08 VITALS — Ht 63.0 in | Wt 246.0 lb

## 2015-03-08 DIAGNOSIS — Z6841 Body Mass Index (BMI) 40.0 and over, adult: Secondary | ICD-10-CM | POA: Insufficient documentation

## 2015-03-08 DIAGNOSIS — G4733 Obstructive sleep apnea (adult) (pediatric): Secondary | ICD-10-CM

## 2015-03-08 DIAGNOSIS — E669 Obesity, unspecified: Secondary | ICD-10-CM | POA: Diagnosis not present

## 2015-03-08 DIAGNOSIS — G4710 Hypersomnia, unspecified: Secondary | ICD-10-CM | POA: Diagnosis present

## 2015-03-08 DIAGNOSIS — R0683 Snoring: Secondary | ICD-10-CM

## 2015-03-17 NOTE — Sleep Study (Signed)
  [place] A. Merlene Laughter, MD     [email or web address]             NOCTURNAL HOME SLEEP STUDY  LOCATION: [place]  Patient Name: Annette Deleon, Annette Deleon Date: 03/08/2015 Gender: Female D.O.B: 10-May-1976 Age (years): 25 Referring Provider: Phillips Odor MD, ABSM Height (inches): 63 Interpreting Physician: Phillips Odor MD, ABSM Weight (lbs): 246 RPSGT: Jacolyn Reedy BMI: 44 MRN: [id number] Neck Size: 15.50 CLINICAL INFORMATION Sleep Study Type: HST  Indication for sleep study: 780.54 Hypersomnia, Obesity, OSA  Epworth Sleepiness Score:  SLEEP STUDY TECHNIQUE A multi-channel overnight portable sleep study was performed. The channels recorded were: nasal airflow, thoracic respiratory movement, and oxygen saturation with a pulse oximetry. Snoring was also monitored.  MEDICATIONS Patient self administered medications include: N/A.  SLEEP ARCHITECTURE Patient was studied for 358.2 minutes. The sleep efficiency was 99.7 % and the patient was supine for 0.1%. The arousal index was 0.0 per hour.  RESPIRATORY PARAMETERS The overall AHI was 14 per hour, with a central apnea index of 0.2 per hour.  The oxygen nadir was 76% during sleep.     CARDIAC DATA Mean heart rate during sleep was 88.6 bpm.  IMPRESSIONS Moderate obstructive sleep apnea occurred during this study (AHI = 14/h). AutoPAP 7-15 is suggested.     Delano Metz, MD Diplomate, American Board of Sleep Medicine.

## 2015-10-11 ENCOUNTER — Encounter (HOSPITAL_COMMUNITY): Payer: Self-pay | Admitting: Emergency Medicine

## 2015-10-11 ENCOUNTER — Emergency Department (HOSPITAL_COMMUNITY)
Admission: EM | Admit: 2015-10-11 | Discharge: 2015-10-11 | Disposition: A | Discharge status: Home / Self Care | Payer: Managed Care, Other (non HMO) | Attending: Emergency Medicine | Admitting: Emergency Medicine

## 2015-10-11 DIAGNOSIS — Z79899 Other long term (current) drug therapy: Secondary | ICD-10-CM | POA: Insufficient documentation

## 2015-10-11 DIAGNOSIS — R51 Headache: Secondary | ICD-10-CM | POA: Diagnosis not present

## 2015-10-11 DIAGNOSIS — R110 Nausea: Secondary | ICD-10-CM | POA: Insufficient documentation

## 2015-10-11 DIAGNOSIS — R42 Dizziness and giddiness: Secondary | ICD-10-CM | POA: Diagnosis not present

## 2015-10-11 DIAGNOSIS — Z8669 Personal history of other diseases of the nervous system and sense organs: Secondary | ICD-10-CM | POA: Insufficient documentation

## 2015-10-11 DIAGNOSIS — I10 Essential (primary) hypertension: Secondary | ICD-10-CM | POA: Insufficient documentation

## 2015-10-11 DIAGNOSIS — Z880 Allergy status to penicillin: Secondary | ICD-10-CM | POA: Insufficient documentation

## 2015-10-11 DIAGNOSIS — R519 Headache, unspecified: Secondary | ICD-10-CM

## 2015-10-11 MED ORDER — DIPHENHYDRAMINE HCL 50 MG/ML IJ SOLN
12.5000 mg | Freq: Once | INTRAMUSCULAR | Status: AC
  Administered 2015-10-11: 12.5 mg via INTRAVENOUS
  Filled 2015-10-11: qty 1

## 2015-10-11 MED ORDER — SODIUM CHLORIDE 0.9 % IV BOLUS (SEPSIS)
500.0000 mL | Freq: Once | INTRAVENOUS | Status: AC
  Administered 2015-10-11: 500 mL via INTRAVENOUS

## 2015-10-11 MED ORDER — PROCHLORPERAZINE EDISYLATE 5 MG/ML IJ SOLN
10.0000 mg | Freq: Once | INTRAMUSCULAR | Status: AC
  Administered 2015-10-11: 10 mg via INTRAVENOUS
  Filled 2015-10-11: qty 2

## 2015-10-11 MED ORDER — METOCLOPRAMIDE HCL 5 MG/ML IJ SOLN
10.0000 mg | Freq: Once | INTRAMUSCULAR | Status: AC
  Administered 2015-10-11: 10 mg via INTRAVENOUS
  Filled 2015-10-11: qty 2

## 2015-10-11 NOTE — ED Provider Notes (Signed)
CSN: [id number]     Arrival date & time 10/11/15  [id number] History   First MD Initiated Contact with Patient 10/11/15 [phone]     Chief Complaint  Patient presents with  . Migraine    HPI   Annette Deleon is a 40 y.o. female with a PMH of HTN, pseudotumor cerebri, migraines who presents to the ED with headache. She states her symptoms started at 1:00 AM this morning and have been constant since that time. She notes pain to the left parietal region. She reports light exacerbates her pain. She has not tried anything for symptom relief. She notes history of headaches and history of pseudotumor cerebri, and states her symptoms feel similar. She does not take medication for pseudotumor cerebri (reports she was taken off of acetazolamide due to paresthesias). She reports associated lightheadedness and nausea. She denies dizziness, vision changes, numbness, weakness, paresthesia, abdominal pain, vomiting.   Past Medical History  Diagnosis Date  . Hypertension   . Pseudotumor cerebri   . Migraines    Past Surgical History  Procedure Laterality Date  . Cesarean section    . Cholecystectomy    . Myomectomy    . Knee surgery      right   No family history on file. Social History  Substance Use Topics  . Smoking status: Never Smoker   . Smokeless tobacco: None  . Alcohol Use: Yes     Comment: occasionally   OB History    No data available       Review of Systems  Eyes: Negative for visual disturbance.  Gastrointestinal: Positive for nausea. Negative for vomiting and abdominal pain.  Neurological: Positive for light-headedness and headaches. Negative for dizziness, syncope, weakness and numbness.  All other systems reviewed and are negative.     Allergies  Imitrex; Prednisone; Yellow jacket venom; Ibuprofen; and Penicillins  Home Medications   Prior to Admission medications   Medication Sig Start Date End Date Taking? Authorizing Provider  amLODipine (NORVASC) 10 MG tablet Take 10  mg by mouth daily. 06/30/15 06/29/16 Yes Historical Provider, MD  clindamycin (CLEOCIN) 150 MG capsule Take 3 capsules (450 mg total) by mouth 3 (three) times daily. Patient taking differently: Take 150 mg by mouth 4 (four) times daily.  05/30/14  Yes Tiffany Carlota Raspberry, PA-C  hydrochlorothiazide (HYDRODIURIL) 50 MG tablet Take 50 mg by mouth daily. 08/04/15  Yes Historical Provider, MD  HYDROcodone-acetaminophen (NORCO/VICODIN) 5-325 MG per tablet Take 1-2 tablets by mouth every 6 (six) hours as needed. 12/29/14  Yes Heather Laisure, PA-C  bacitracin ointment Apply 1 application topically 2 (two) times daily. Patient not taking: Reported on 08/31/2014 06/15/14   Mercedes Camprubi-Soms, PA-C  ciprofloxacin (CIPRO) 750 MG tablet Take 1 tablet (750 mg total) by mouth 2 (two) times daily. Patient not taking: Reported on 06/15/2014 05/30/14   Delos Haring, PA-C  cyclobenzaprine (FLEXERIL) 10 MG tablet Take 1 tablet (10 mg total) by mouth 3 (three) times daily as needed for muscle spasms. Patient not taking: Reported on 08/31/2014 06/15/14   Mercedes Camprubi-Soms, PA-C  diazepam (VALIUM) 5 MG tablet Take 1 tablet (5 mg total) by mouth 2 (two) times daily. Patient not taking: Reported on 06/15/2014 05/30/14   Delos Haring, PA-C  HYDROcodone-homatropine [facility]) 5-1.5 MG/5ML syrup Take 5 mLs by mouth every 6 (six) hours as needed for cough. Patient not taking: Reported on 09/08/2014 06/27/14   Hyman Bible, PA-C  naproxen (NAPROSYN) 500 MG tablet Take 1 tablet (500 mg total) by  mouth 2 (two) times daily. Patient not taking: Reported on 10/11/2015 12/29/14   Hyman Bible, PA-C  ondansetron (ZOFRAN) 4 MG tablet Take 1 tablet (4 mg total) by mouth every 6 (six) hours. Patient not taking: Reported on 06/15/2014 05/30/14   Delos Haring, PA-C  oxyCODONE-acetaminophen (PERCOCET) 5-325 MG per tablet Take 1 tablet by mouth every 4 (four) hours as needed for moderate pain. Patient not taking: Reported on 10/11/2015  [id number]   Delora Fuel, MD  promethazine (PHENERGAN) 25 MG tablet Take 1 tablet (25 mg total) by mouth every 6 (six) hours as needed for nausea or vomiting. Patient not taking: Reported on 06/15/2014 01/30/14   Francine Graven, DO  traMADol (ULTRAM) 50 MG tablet Take 1 tablet (50 mg total) by mouth every 6 (six) hours as needed. Patient not taking: Reported on 06/15/2014 05/30/14   Delos Haring, PA-C    BP 126/84 mmHg  Pulse 74  Temp(Src) 98.3 F (36.8 C) (Oral)  Resp 18  SpO2 100% Physical Exam  Constitutional: She is oriented to person, place, and time. She appears well-developed and well-nourished. No distress.  HENT:  Head: Normocephalic and atraumatic.  Right Ear: External ear normal.  Left Ear: External ear normal.  Nose: Nose normal.  Mouth/Throat: Uvula is midline, oropharynx is clear and moist and mucous membranes are normal.  Eyes: Conjunctivae, EOM and lids are normal. Pupils are equal, round, and reactive to light. Right eye exhibits no discharge. Left eye exhibits no discharge. No scleral icterus.  Neck: Normal range of motion. Neck supple.  Cardiovascular: Normal rate, regular rhythm, normal heart sounds, intact distal pulses and normal pulses.   Pulmonary/Chest: Effort normal and breath sounds normal. No respiratory distress. She has no wheezes. She has no rales.  Abdominal: Soft. Normal appearance and bowel sounds are normal. She exhibits no distension and no mass. There is no tenderness. There is no rigidity, no rebound and no guarding.  Musculoskeletal: Normal range of motion. She exhibits no edema or tenderness.  Neurological: She is alert and oriented to person, place, and time. She has normal strength. No cranial nerve deficit or sensory deficit.  Skin: Skin is warm, dry and intact. No rash noted. She is not diaphoretic. No erythema. No pallor.  Psychiatric: She has a normal mood and affect. Her speech is normal and behavior is normal.  Nursing note and vitals  reviewed.   ED Course  Procedures (including critical care time)  Labs Review Labs Reviewed - No data to display  Imaging Review No results found.    EKG Interpretation None      MDM   Final diagnoses:  Headache, unspecified headache type    40 year old female presents with headache. States she has a history of pseudotumor cerebri and migraines and notes her symptoms are characteristic of this. Reports associated photophobia, lightheadedness, and nausea. Denies dizziness, vision changes, numbness, weakness, paresthesia, abdominal pain, vomiting.  Patient is afebrile. Vital signs stable. Normal neuro exam with no focal deficit. No nuchal rigidity.  Patient given compazine, benadryl, fluids. She reports improvement in headache though notes persistent nausea. Patient given reglan. On reassessment, she reports significant symptom improvement and is requesting to be discharged home. Patient is non-toxic and well-appearing, feel she is stable for discharge at this time. Patient to follow-up with her neurologist (states she will call to schedule an appointment later today). Strict return precautions discussed. Patient verbalizes her understanding and is in agreement with plan.  BP 126/84 mmHg  Pulse 74  Temp(Src) 98.3 F (36.8 C) (Oral)  Resp 18  SpO2 100%     Marella Chimes, PA-C 10/11/15 [place], MD 10/12/15 [phone]

## 2015-10-11 NOTE — Discharge Instructions (Signed)
1. Medications: usual home medications 2. Treatment: rest, drink plenty of fluids 3. Follow Up: please followup with your neurologist for discussion of your diagnoses and further evaluation after today's visit; if you do not have a primary care doctor use the phone number listed in your discharge paperwork to find one; please return to the ER for new or worsening symptoms   Migraine Headache A migraine headache is an intense, throbbing pain on one or both sides of your head. A migraine can last for 30 minutes to several hours. CAUSES  The exact cause of a migraine headache is not always known. However, a migraine may be caused when nerves in the brain become irritated and release chemicals that cause inflammation. This causes pain. Certain things may also trigger migraines, such as:  Alcohol.  Smoking.  Stress.  Menstruation.  Aged cheeses.  Foods or drinks that contain nitrates, glutamate, aspartame, or tyramine.  Lack of sleep.  Chocolate.  Caffeine.  Hunger.  Physical exertion.  Fatigue.  Medicines used to treat chest pain (nitroglycerine), birth control pills, estrogen, and some blood pressure medicines. SIGNS AND SYMPTOMS  Pain on one or both sides of your head.  Pulsating or throbbing pain.  Severe pain that prevents daily activities.  Pain that is aggravated by any physical activity.  Nausea, vomiting, or both.  Dizziness.  Pain with exposure to bright lights, loud noises, or activity.  General sensitivity to bright lights, loud noises, or smells. Before you get a migraine, you may get warning signs that a migraine is coming (aura). An aura may include:  Seeing flashing lights.  Seeing bright spots, halos, or zigzag lines.  Having tunnel vision or blurred vision.  Having feelings of numbness or tingling.  Having trouble talking.  Having muscle weakness. DIAGNOSIS  A migraine headache is often diagnosed based on:  Symptoms.  Physical  exam.  A CT scan or MRI of your head. These imaging tests cannot diagnose migraines, but they can help rule out other causes of headaches. TREATMENT Medicines may be given for pain and nausea. Medicines can also be given to help prevent recurrent migraines.  HOME CARE INSTRUCTIONS  Only take over-the-counter or prescription medicines for pain or discomfort as directed by your health care provider. The use of long-term narcotics is not recommended.  Lie down in a dark, quiet room when you have a migraine.  Keep a journal to find out what may trigger your migraine headaches. For example, write down:  What you eat and drink.  How much sleep you get.  Any change to your diet or medicines.  Limit alcohol consumption.  Quit smoking if you smoke.  Get 7-9 hours of sleep, or as recommended by your health care provider.  Limit stress.  Keep lights dim if bright lights bother you and make your migraines worse. SEEK IMMEDIATE MEDICAL CARE IF:   Your migraine becomes severe.  You have a fever.  You have a stiff neck.  You have vision loss.  You have muscular weakness or loss of muscle control.  You start losing your balance or have trouble walking.  You feel faint or pass out.  You have severe symptoms that are different from your first symptoms. MAKE SURE YOU:   Understand these instructions.  Will watch your condition.  Will get help right away if you are not doing well or get worse.   This information is not intended to replace advice given to you by your health care provider. Make sure  you discuss any questions you have with your health care provider.   Document Released: 05/20/2005 Document Revised: 06/10/2014 Document Reviewed: 01/25/2013 Elsevier Interactive Patient Education [employer].

## 2015-10-11 NOTE — ED Notes (Signed)
Pt in reporting migraine present since 0100. Pt states hx pseudotumor cerebri. Reports light sensitivity and nausea. Rates pain 10/10.

## 2015-11-17 ENCOUNTER — Emergency Department (HOSPITAL_COMMUNITY)
Admission: EM | Admit: 2015-11-17 | Discharge: 2015-11-17 | Disposition: A | Discharge status: Home / Self Care | Payer: Managed Care, Other (non HMO) | Attending: Emergency Medicine | Admitting: Emergency Medicine

## 2015-11-17 ENCOUNTER — Emergency Department (HOSPITAL_COMMUNITY): Payer: Managed Care, Other (non HMO)

## 2015-11-17 ENCOUNTER — Encounter (HOSPITAL_COMMUNITY): Payer: Self-pay

## 2015-11-17 DIAGNOSIS — R091 Pleurisy: Secondary | ICD-10-CM | POA: Insufficient documentation

## 2015-11-17 DIAGNOSIS — K219 Gastro-esophageal reflux disease without esophagitis: Secondary | ICD-10-CM | POA: Insufficient documentation

## 2015-11-17 DIAGNOSIS — I10 Essential (primary) hypertension: Secondary | ICD-10-CM | POA: Insufficient documentation

## 2015-11-17 DIAGNOSIS — IMO0001 Reserved for inherently not codable concepts without codable children: Secondary | ICD-10-CM

## 2015-11-17 HISTORY — DX: Sleep apnea, unspecified: G47.30

## 2015-11-17 LAB — CBC
HEMATOCRIT: 36.4 % (ref 36.0–46.0)
HEMOGLOBIN: 11.9 g/dL — AB (ref 12.0–15.0)
MCH: 26.4 pg (ref 26.0–34.0)
MCHC: 32.7 g/dL (ref 30.0–36.0)
MCV: 80.7 fL (ref 78.0–100.0)
Platelets: 447 10*3/uL — ABNORMAL HIGH (ref 150–400)
RBC: 4.51 MIL/uL (ref 3.87–5.11)
RDW: 14.7 % (ref 11.5–15.5)
WBC: 10.5 10*3/uL (ref 4.0–10.5)

## 2015-11-17 LAB — D-DIMER, QUANTITATIVE: D-Dimer, Quant: 0.45 ug/mL-FEU (ref 0.00–0.50)

## 2015-11-17 LAB — BASIC METABOLIC PANEL
ANION GAP: 9 (ref 5–15)
BUN: 10 mg/dL (ref 6–20)
CO2: 26 mmol/L (ref 22–32)
Calcium: 9.3 mg/dL (ref 8.9–10.3)
Chloride: 102 mmol/L (ref 101–111)
Creatinine, Ser: 1 mg/dL (ref 0.44–1.00)
GLUCOSE: 96 mg/dL (ref 65–99)
POTASSIUM: 3.4 mmol/L — AB (ref 3.5–5.1)
Sodium: 137 mmol/L (ref 135–145)

## 2015-11-17 LAB — I-STAT TROPONIN, ED: Troponin i, poc: 0 ng/mL (ref 0.00–0.08)

## 2015-11-17 MED ORDER — TRAMADOL HCL 50 MG PO TABS: 50.0000 mg | ORAL_TABLET | Freq: Four times a day (QID) | ORAL | Status: DC | PRN

## 2015-11-17 MED ORDER — OMEPRAZOLE 20 MG PO CPDR: 20.0000 mg | DELAYED_RELEASE_CAPSULE | Freq: Every day | ORAL | Status: DC

## 2015-11-17 MED ORDER — FENTANYL CITRATE (PF) 100 MCG/2ML IJ SOLN
50.0000 ug | Freq: Once | INTRAMUSCULAR | Status: AC
  Administered 2015-11-17: 50 ug via INTRAVENOUS
  Filled 2015-11-17: qty 2

## 2015-11-17 MED ORDER — FAMOTIDINE IN NACL 20-0.9 MG/50ML-% IV SOLN
20.0000 mg | Freq: Two times a day (BID) | INTRAVENOUS | Status: DC
  Administered 2015-11-17: 20 mg via INTRAVENOUS
  Filled 2015-11-17: qty 50

## 2015-11-17 MED ORDER — ONDANSETRON HCL 4 MG/2ML IJ SOLN
4.0000 mg | Freq: Once | INTRAMUSCULAR | Status: AC
  Administered 2015-11-17: 4 mg via INTRAVENOUS
  Filled 2015-11-17: qty 2

## 2015-11-17 NOTE — ED Provider Notes (Signed)
CSN: [id number]     Arrival date & time 11/17/15  [id number] History   First MD Initiated Contact with Patient 11/17/15 [phone]     Chief Complaint  Patient presents with  . Chest Pain      HPI Per PT, Pt is coming from home. Pt was working last night at 1900 when she started to feel midsternal chest pressure with some fullness in her throat. Pt thought it was indigestion and started to treat with medication. Pt went to bed and when she woke up, pt reported discomfort in her chest and then radiated to her back about 0745 between her shoulder blades. Pt is alert and oriented x4 upon arrival.   Past Medical History  Diagnosis Date  . Hypertension   . Pseudotumor cerebri   . Migraines   . Sleep apnea    Past Surgical History  Procedure Laterality Date  . Cesarean section    . Cholecystectomy    . Myomectomy    . Knee surgery      right  . Arthroscopic repair acl     No family history on file. Social History  Substance Use Topics  . Smoking status: Never Smoker   . Smokeless tobacco: None  . Alcohol Use: Yes     Comment: occasionally   OB History    No data available     Review of Systems All other systems reviewed and are negative   Allergies  Imitrex; Prednisone; Yellow jacket venom; Ibuprofen; and Penicillins  Home Medications   Prior to Admission medications   Medication Sig Start Date End Date Taking? Authorizing Provider  amLODipine (NORVASC) 10 MG tablet Take 10 mg by mouth daily. 06/30/15 06/29/16  Historical Provider, MD  bacitracin ointment Apply 1 application topically 2 (two) times daily. Patient not taking: Reported on 08/31/2014 06/15/14   Mercedes Camprubi-Soms, PA-C  clindamycin (CLEOCIN) 150 MG capsule Take 3 capsules (450 mg total) by mouth 3 (three) times daily. Patient taking differently: Take 150 mg by mouth 4 (four) times daily.  05/30/14   Tiffany Carlota Raspberry, PA-C  cyclobenzaprine (FLEXERIL) 10 MG tablet Take 1 tablet (10 mg total) by mouth 3 (three) times  daily as needed for muscle spasms. Patient not taking: Reported on 08/31/2014 06/15/14   Mercedes Camprubi-Soms, PA-C  diazepam (VALIUM) 5 MG tablet Take 1 tablet (5 mg total) by mouth 2 (two) times daily. Patient not taking: Reported on 06/15/2014 05/30/14   Delos Haring, PA-C  hydrochlorothiazide (HYDRODIURIL) 50 MG tablet Take 50 mg by mouth daily. 08/04/15   Historical Provider, MD  naproxen (NAPROSYN) 500 MG tablet Take 1 tablet (500 mg total) by mouth 2 (two) times daily. Patient not taking: Reported on 10/11/2015 12/29/14   Hyman Bible, PA-C  omeprazole (PRILOSEC) 20 MG capsule Take 1 capsule (20 mg total) by mouth daily. 11/17/15   Leonard Schwartz, MD  ondansetron (ZOFRAN) 4 MG tablet Take 1 tablet (4 mg total) by mouth every 6 (six) hours. Patient not taking: Reported on 06/15/2014 05/30/14   Delos Haring, PA-C  promethazine (PHENERGAN) 25 MG tablet Take 1 tablet (25 mg total) by mouth every 6 (six) hours as needed for nausea or vomiting. Patient not taking: Reported on 06/15/2014 01/30/14   Francine Graven, DO  traMADol (ULTRAM) 50 MG tablet Take 1 tablet (50 mg total) by mouth every 6 (six) hours as needed. 11/17/15   Leonard Schwartz, MD   BP 153/95 mmHg  Pulse 78  Temp(Src) 98.4 F (36.9 C) (Oral)  Resp  16  Ht 5\' 3"  (1.6 m)  Wt 252 lb (114.306 kg)  BMI 44.65 kg/m2  SpO2 100%  LMP 11/04/2015 Physical Exam Physical Exam  Nursing note and vitals reviewed. Constitutional: She is oriented to person, place, and time. She appears well-developed and well-nourished. No distress.  HENT:  Head: Normocephalic and atraumatic.  Eyes: Pupils are equal, round, and reactive to light.  Neck: Normal range of motion.  Cardiovascular: Normal rate and intact distal pulses.   Pulmonary/Chest: No respiratory distress.  Abdominal: Normal appearance. She exhibits no distension.  Musculoskeletal: Normal range of motion.  Neurological: She is alert and oriented to person, place, and time. No cranial  nerve deficit.  Skin: Skin is warm and dry. No rash noted.  Psychiatric: She has a normal mood and affect. Her behavior is normal.   ED Course  Procedures (including critical care time) Medications  famotidine (PEPCID) IVPB 20 mg premix (0 mg Intravenous Stopped 11/17/15 1059)  fentaNYL (SUBLIMAZE) injection 50 mcg (50 mcg Intravenous Given 11/17/15 1115)    Labs Review Labs Reviewed  BASIC METABOLIC PANEL - Abnormal; Notable for the following:    Potassium 3.4 (*)    All other components within normal limits  CBC - Abnormal; Notable for the following:    Hemoglobin 11.9 (*)    Platelets 447 (*)    All other components within normal limits  D-DIMER, QUANTITATIVE (NOT AT [facility])  Randolm Idol, ED    Imaging Review Dg Chest 2 View  11/17/2015  CLINICAL DATA:  Left-sided chest pain, no known injury, initial encounter EXAM: CHEST  2 VIEW COMPARISON:  09/08/2014 FINDINGS: The heart size and mediastinal contours are within normal limits. Both lungs are clear. The visualized skeletal structures are unremarkable. IMPRESSION: No active cardiopulmonary disease. Electronically Signed   By: Inez Catalina M.D.   On: 11/17/2015 09:45   I have personally reviewed and evaluated these images and lab results as part of my medical decision-making.   Date: 11/17/2015  Rate: 103  Rhythm: Sinus tachycardia  QRS Axis: Left axis deviation  Intervals: normal  ST/T Wave abnormalities: Nonspecific ST changes  Conduction Disutrbances: none  Narrative Interpretation: Abnormal EKG      MDM   Final diagnoses:  Pleurisy  Reflux        Leonard Schwartz, MD 11/17/15 1130

## 2015-11-17 NOTE — Discharge Instructions (Signed)
Food Choices for Gastroesophageal Reflux Disease, Adult When you have gastroesophageal reflux disease (GERD), the foods you eat and your eating habits are very important. Choosing the right foods can help ease your discomfort.  WHAT GUIDELINES DO I NEED TO FOLLOW?   Choose fruits, vegetables, whole grains, and low-fat dairy products.   Choose low-fat meat, fish, and poultry.  Limit fats such as oils, salad dressings, butter, nuts, and avocado.   Keep a food diary. This helps you identify foods that cause symptoms.   Avoid foods that cause symptoms. These may be different for everyone.   Eat small meals often instead of 3 large meals a day.   Eat your meals slowly, in a place where you are relaxed.   Limit fried foods.   Cook foods using methods other than frying.   Avoid drinking alcohol.   Avoid drinking large amounts of liquids with your meals.   Avoid bending over or lying down until 2-3 hours after eating.  WHAT FOODS ARE NOT RECOMMENDED?  These are some foods and drinks that may make your symptoms worse: Vegetables Tomatoes. Tomato juice. Tomato and spaghetti sauce. Chili peppers. Onion and garlic. Horseradish. Fruits Oranges, grapefruit, and lemon (fruit and juice). Meats High-fat meats, fish, and poultry. This includes hot dogs, ribs, ham, sausage, salami, and bacon. Dairy Whole milk and chocolate milk. Sour cream. Cream. Butter. Ice cream. Cream cheese.  Drinks Coffee and tea. Bubbly (carbonated) drinks or energy drinks. Condiments Hot sauce. Barbecue sauce.  Sweets/Desserts Chocolate and cocoa. Donuts. Peppermint and spearmint. Fats and Oils High-fat foods. This includes [place] fries and potato chips. Other Vinegar. Strong spices. This includes black pepper, white pepper, red pepper, cayenne, curry powder, cloves, ginger, and chili powder. The items listed above may not be a complete list of foods and drinks to avoid. Contact your dietitian for more  information.   This information is not intended to replace advice given to you by your health care provider. Make sure you discuss any questions you have with your health care provider.   Document Released: 11/19/2011 Document Revised: 06/10/2014 Document Reviewed: 03/24/2013 Elsevier Interactive Patient Education 2016 [place] is redness, puffiness (swelling), and soreness (inflammation) of the lining of the lungs. It can be hard to breathe and hurt to breathe. Coughing or deep breathing will make it hurt more. It is often caused by an existing infection or disease.  HOME CARE  Only take medicine as told by your doctor.  Only take antibiotic medicine as directed. Make sure to finish it even if you start to feel better. GET HELP RIGHT AWAY IF:   Your lips, fingernails, or toenails are blue or dark.  You cough up blood.  You have a hard time breathing.  Your pain is not controlled with medicine or it lasts for more than 1 week.  Your pain spreads (radiates) into your neck, arms, or jaw.  You are short of breath or wheezing.  You develop a fever, rash, throw up (vomit), or faint. MAKE SURE YOU:   Understand these instructions.  Will watch your condition.  Will get help right away if you are not doing well or get worse.   This information is not intended to replace advice given to you by your health care provider. Make sure you discuss any questions you have with your health care provider.   Document Released: 05/02/2008 Document Revised: 01/20/2013 Document Reviewed: 11/01/2012 Elsevier Interactive Patient Education [employer].

## 2015-11-17 NOTE — ED Notes (Addendum)
Per PT, Pt is coming from home. Pt was working last night at 1900 when she started to feel midsternal chest pressure with some fullness in her throat. Pt thought it was indigestion and started to treat with medication. Pt went to bed and when she woke up, pt reported discomfort in her chest and then radiated to her back about 0745 between her shoulder blades. Pt is alert and oriented x4 upon arrival. No hx of the same.

## 2015-12-28 ENCOUNTER — Encounter (HOSPITAL_COMMUNITY): Payer: Self-pay | Admitting: Emergency Medicine

## 2015-12-28 DIAGNOSIS — Z79899 Other long term (current) drug therapy: Secondary | ICD-10-CM | POA: Diagnosis not present

## 2015-12-28 DIAGNOSIS — I10 Essential (primary) hypertension: Secondary | ICD-10-CM | POA: Insufficient documentation

## 2015-12-28 DIAGNOSIS — R1031 Right lower quadrant pain: Secondary | ICD-10-CM | POA: Diagnosis not present

## 2015-12-28 LAB — COMPREHENSIVE METABOLIC PANEL
ALK PHOS: 69 U/L (ref 38–126)
ALT: 21 U/L (ref 14–54)
AST: 18 U/L (ref 15–41)
Albumin: 3.7 g/dL (ref 3.5–5.0)
Anion gap: 8 (ref 5–15)
BILIRUBIN TOTAL: 0.5 mg/dL (ref 0.3–1.2)
BUN: 17 mg/dL (ref 6–20)
CALCIUM: 9.3 mg/dL (ref 8.9–10.3)
CO2: 27 mmol/L (ref 22–32)
CREATININE: 1.25 mg/dL — AB (ref 0.44–1.00)
Chloride: 102 mmol/L (ref 101–111)
GFR calc Af Amer: >60 mL/min (ref >60)
GFR, EST NON AFRICAN AMERICAN: 53 mL/min — AB (ref >60)
Glucose, Bld: 100 mg/dL — ABNORMAL HIGH (ref 65–99)
POTASSIUM: 3.5 mmol/L (ref 3.5–5.1)
Sodium: 137 mmol/L (ref 135–145)
TOTAL PROTEIN: 7.7 g/dL (ref 6.5–8.1)

## 2015-12-28 LAB — URINE MICROSCOPIC-ADD ON

## 2015-12-28 LAB — CBC
HEMATOCRIT: 37.5 % (ref 36.0–46.0)
Hemoglobin: 12.4 g/dL (ref 12.0–15.0)
MCH: 26.5 pg (ref 26.0–34.0)
MCHC: 33.1 g/dL (ref 30.0–36.0)
MCV: 80.1 fL (ref 78.0–100.0)
PLATELETS: 480 10*3/uL — AB (ref 150–400)
RBC: 4.68 MIL/uL (ref 3.87–5.11)
RDW: 14.9 % (ref 11.5–15.5)
WBC: 14.7 10*3/uL — AB (ref 4.0–10.5)

## 2015-12-28 LAB — URINALYSIS, ROUTINE W REFLEX MICROSCOPIC
BILIRUBIN URINE: NEGATIVE
Glucose, UA: NEGATIVE mg/dL
Hgb urine dipstick: NEGATIVE
KETONES UR: NEGATIVE mg/dL
NITRITE: NEGATIVE
PROTEIN: NEGATIVE mg/dL
Specific Gravity, Urine: 1.018 (ref 1.005–1.030)
pH: 5.5 (ref 5.0–8.0)

## 2015-12-28 LAB — POC URINE PREG, ED: Preg Test, Ur: NEGATIVE

## 2015-12-28 LAB — LIPASE, BLOOD: Lipase: 15 U/L (ref 11–51)

## 2015-12-28 NOTE — ED Triage Notes (Signed)
Patient reports mid/RLQ pain onset last night with emesis today , denies diarrhea , no fever or chills.

## 2015-12-29 ENCOUNTER — Emergency Department (HOSPITAL_COMMUNITY): Payer: Managed Care, Other (non HMO)

## 2015-12-29 ENCOUNTER — Emergency Department (HOSPITAL_COMMUNITY)
Admission: EM | Admit: 2015-12-29 | Discharge: 2015-12-29 | Disposition: A | Discharge status: Home / Self Care | Payer: Managed Care, Other (non HMO) | Attending: Emergency Medicine | Admitting: Emergency Medicine

## 2015-12-29 ENCOUNTER — Encounter (HOSPITAL_COMMUNITY): Payer: Self-pay | Admitting: Radiology

## 2015-12-29 DIAGNOSIS — R1030 Lower abdominal pain, unspecified: Secondary | ICD-10-CM

## 2015-12-29 HISTORY — DX: Obesity, unspecified: E66.9

## 2015-12-29 LAB — DIFFERENTIAL
BASOS ABS: 0 10*3/uL (ref 0.0–0.1)
Basophils Relative: 0 %
EOS ABS: 0.1 10*3/uL (ref 0.0–0.7)
EOS PCT: 1 %
Lymphocytes Relative: 21 %
Lymphs Abs: 3.1 10*3/uL (ref 0.7–4.0)
Monocytes Absolute: 0.9 10*3/uL (ref 0.1–1.0)
Monocytes Relative: 6 %
NEUTROS PCT: 72 %
Neutro Abs: 10.7 10*3/uL — ABNORMAL HIGH (ref 1.7–7.7)

## 2015-12-29 MED ORDER — ONDANSETRON HCL 4 MG/2ML IJ SOLN
4.0000 mg | Freq: Once | INTRAMUSCULAR | Status: AC
  Administered 2015-12-29: 4 mg via INTRAVENOUS
  Filled 2015-12-29: qty 2

## 2015-12-29 MED ORDER — IOPAMIDOL (ISOVUE-300) INJECTION 61%
INTRAVENOUS | Status: AC
  Administered 2015-12-29: 100 mL
  Filled 2015-12-29: qty 100

## 2015-12-29 MED ORDER — SODIUM CHLORIDE 0.9 % IV SOLN
1000.0000 mL | INTRAVENOUS | Status: DC
  Administered 2015-12-29: 1000 mL via INTRAVENOUS

## 2015-12-29 MED ORDER — SODIUM CHLORIDE 0.9 % IV SOLN
1000.0000 mL | Freq: Once | INTRAVENOUS | Status: AC
  Administered 2015-12-29: 1000 mL via INTRAVENOUS

## 2015-12-29 MED ORDER — ONDANSETRON HCL 4 MG PO TABS: 4.0000 mg | ORAL_TABLET | Freq: Four times a day (QID) | ORAL | 0 refills | Status: DC | PRN

## 2015-12-29 MED ORDER — MORPHINE SULFATE (PF) 4 MG/ML IV SOLN
4.0000 mg | Freq: Once | INTRAVENOUS | Status: AC
  Administered 2015-12-29: 4 mg via INTRAVENOUS
  Filled 2015-12-29: qty 1

## 2015-12-29 MED ORDER — METOCLOPRAMIDE HCL 5 MG/ML IJ SOLN
10.0000 mg | Freq: Once | INTRAMUSCULAR | Status: AC
  Administered 2015-12-29: 10 mg via INTRAVENOUS
  Filled 2015-12-29: qty 2

## 2015-12-29 MED ORDER — OXYCODONE-ACETAMINOPHEN 5-325 MG PO TABS: 1.0000 | ORAL_TABLET | ORAL | 0 refills | Status: DC | PRN

## 2015-12-29 NOTE — ED Provider Notes (Signed)
[facility] DEPT Provider Note   CSN: [id number] Arrival date & time: 12/28/15  2210  First Provider Contact:  None    By signing my name below, I, Julien Nordmann, attest that this documentation has been prepared under the direction and in the presence of Delora Fuel, MD.  Electronically Signed: Julien Nordmann, ED Scribe. 12/29/15. 1:03 AM.    History   Chief Complaint Chief Complaint  Patient presents with  . Abdominal Pain    The history is provided by the patient. No language interpreter was used.   HPI Comments: Annette Deleon is a 40 y.o. female who has a PMhx of HTN, presents to the Emergency Department complaining of sudden onset, gradual worsening, intermittent RLQ and periumbilical abdominal pain that radiates into her back onset last night. She reports associated nausea and projectile vomiting x2. Pt notes last night she was making dinner when she felt a sudden pain in her RLQ and periumbilical area. She notes going into work and having the pain occur intermittently and she began to projectile vomit 2 times. She says applying pressure to the area and laying in a fetal position alleviates her pain minimally. Her LMP was 6/20. Pt is sexually active and uses protection. She denies diarrhea, fever, chills, sweats, bowel/bladder incontinence, or vaginal discharge.   Past Medical History:  Diagnosis Date  . Hypertension   . Migraines   . Obesity   . Pseudotumor cerebri   . Sleep apnea     Patient Active Problem List   Diagnosis Date Noted  . Near syncope 09/08/2014  . Complicated migraine [id number]  . HTN (hypertension) 11/20/2011  . Pseudotumor cerebri 11/20/2011  . Obesity 11/20/2011    Past Surgical History:  Procedure Laterality Date  . ARTHROSCOPIC REPAIR ACL    . CESAREAN SECTION    . CHOLECYSTECTOMY    . KNEE SURGERY     right  . MYOMECTOMY      OB History    No data available       Home Medications    Prior to Admission medications     Medication Sig Start Date End Date Taking? Authorizing Provider  amLODipine (NORVASC) 10 MG tablet Take 10 mg by mouth daily. 06/30/15 06/29/16  Historical Provider, MD  bacitracin ointment Apply 1 application topically 2 (two) times daily. Patient not taking: Reported on 08/31/2014 06/15/14   Mercedes Camprubi-Soms, PA-C  clindamycin (CLEOCIN) 150 MG capsule Take 3 capsules (450 mg total) by mouth 3 (three) times daily. Patient taking differently: Take 150 mg by mouth 4 (four) times daily.  05/30/14   Tiffany Carlota Raspberry, PA-C  cyclobenzaprine (FLEXERIL) 10 MG tablet Take 1 tablet (10 mg total) by mouth 3 (three) times daily as needed for muscle spasms. Patient not taking: Reported on 08/31/2014 06/15/14   Mercedes Camprubi-Soms, PA-C  diazepam (VALIUM) 5 MG tablet Take 1 tablet (5 mg total) by mouth 2 (two) times daily. Patient not taking: Reported on 06/15/2014 05/30/14   Delos Haring, PA-C  hydrochlorothiazide (HYDRODIURIL) 50 MG tablet Take 50 mg by mouth daily. 08/04/15   Historical Provider, MD  naproxen (NAPROSYN) 500 MG tablet Take 1 tablet (500 mg total) by mouth 2 (two) times daily. Patient not taking: Reported on 10/11/2015 12/29/14   Hyman Bible, PA-C  omeprazole (PRILOSEC) 20 MG capsule Take 1 capsule (20 mg total) by mouth daily. 11/17/15   Leonard Schwartz, MD  ondansetron (ZOFRAN) 4 MG tablet Take 1 tablet (4 mg total) by mouth every 6 (six) hours. Patient  not taking: Reported on 06/15/2014 05/30/14   Delos Haring, PA-C  promethazine (PHENERGAN) 25 MG tablet Take 1 tablet (25 mg total) by mouth every 6 (six) hours as needed for nausea or vomiting. Patient not taking: Reported on 06/15/2014 01/30/14   Francine Graven, DO  traMADol (ULTRAM) 50 MG tablet Take 1 tablet (50 mg total) by mouth every 6 (six) hours as needed. 11/17/15   Leonard Schwartz, MD    Family History No family history on file.  Social History Social History  Substance Use Topics  . Smoking status: Never Smoker  .  Smokeless tobacco: Not on file  . Alcohol use Yes     Comment: occasionally     Allergies   Imitrex [sumatriptan base]; Prednisone; Yellow jacket venom [bee venom]; Ibuprofen; and Penicillins   Review of Systems Review of Systems  Constitutional: Negative for chills and fever.  Gastrointestinal: Positive for abdominal pain, nausea and vomiting. Negative for diarrhea.  Genitourinary: Negative for vaginal discharge.  All other systems reviewed and are negative.    Physical Exam Updated Vital Signs BP 161/82 (BP Location: Right Arm)   Pulse 70   Temp 98.4 F (36.9 C) (Oral)   Resp 16   LMP 11/21/2015   SpO2 99%   Physical Exam  Constitutional: She is oriented to person, place, and time. She appears well-developed and well-nourished. No distress.  Obese, appears uncomfortable  HENT:  Head: Normocephalic and atraumatic.  Eyes: EOM are normal. Pupils are equal, round, and reactive to light.  Neck: Normal range of motion. Neck supple. No JVD present.  Cardiovascular: Normal rate, regular rhythm and normal heart sounds.   No murmur heard. Pulmonary/Chest: Effort normal and breath sounds normal.  Abdominal: Soft. She exhibits no distension. There is tenderness.  Moderate tenderness to RLQ in [place] area Bowel sounds decreased  Musculoskeletal: Normal range of motion. She exhibits no edema.  Lymphadenopathy:    She has no cervical adenopathy.  Neurological: She is alert and oriented to person, place, and time. No cranial nerve deficit. She exhibits normal muscle tone. Coordination normal.  Skin: Skin is warm and dry. No rash noted.  Psychiatric: She has a normal mood and affect. Her behavior is normal. Judgment and thought content normal.  Nursing note and vitals reviewed.    ED Treatments / Results  DIAGNOSTIC STUDIES: Oxygen Saturation is 99% on RA, normal by my interpretation.  COORDINATION OF CARE:  12:50 AM Will order CT of abdomen. Discussed treatment plan  which includes IV fluids with pt at bedside and pt agreed to plan.  Labs (all labs ordered are listed, but only abnormal results are displayed) Labs Reviewed  COMPREHENSIVE METABOLIC PANEL - Abnormal; Notable for the following:       Result Value   Glucose, Bld 100 (*)    Creatinine, Ser 1.25 (*)    GFR calc non Af Amer 53 (*)    All other components within normal limits  CBC - Abnormal; Notable for the following:    WBC 14.7 (*)    Platelets 480 (*)    All other components within normal limits  URINALYSIS, ROUTINE W REFLEX MICROSCOPIC (NOT AT [facility]) - Abnormal; Notable for the following:    APPearance CLOUDY (*)    Leukocytes, UA MODERATE (*)    All other components within normal limits  URINE MICROSCOPIC-ADD ON - Abnormal; Notable for the following:    Squamous Epithelial / LPF 6-30 (*)    Bacteria, UA FEW (*)  Casts HYALINE CASTS (*)    All other components within normal limits  LIPASE, BLOOD  POC URINE PREG, ED    Radiology No results found.  Procedures Procedures (including critical care time)  Medications Ordered in ED Medications  0.9 %  sodium chloride infusion (not administered)    Followed by  0.9 %  sodium chloride infusion (not administered)  ondansetron (ZOFRAN) injection 4 mg (not administered)  morphine 4 MG/ML injection 4 mg (not administered)     Initial Impression / Assessment and Plan / ED Course  I have reviewed the triage vital signs and the nursing notes.  Pertinent labs & imaging results that were available during my care of the patient were reviewed by me and considered in my medical decision making (see chart for details).  Clinical Course  Comment By Time  Mild leukocytosis without left shift. Borderline renal insufficiency. Delora Fuel, MD [id number]    Abdominal pain which seems to localize in the right lower quadrant. Pain seems to be clearly outside of the pelvis. She is sent for CT of abdomen and pelvis to rule out appendicitis.  Old records are reviewed, and she has no relevant past visits. She is given IV fluids and morphine for pain with good relief of pain. CT showed evidence of uterine fibroids but no acute process. On reexam, abdomen is still benign. She is discharged with a prescription for oxycodone-acetaminophen for pain and is referred back to her PCP for follow-up. Also given prescription for and ondansetron. Return precautions given.  Final Clinical Impressions(s) / ED Diagnoses   Final diagnoses:  None   I personally performed the services described in this documentation, which was scribed in my presence. The recorded information has been reviewed and is accurate.    New Prescriptions New Prescriptions   No medications on file     Delora Fuel, MD [id number]

## 2015-12-29 NOTE — Discharge Instructions (Signed)
Return if pain is getting worse, you start running a fever, or if any new symptoms occur.

## 2016-07-27 ENCOUNTER — Encounter (HOSPITAL_COMMUNITY): Payer: Self-pay | Admitting: Neurology

## 2016-07-27 ENCOUNTER — Emergency Department (HOSPITAL_COMMUNITY)
Admission: EM | Admit: 2016-07-27 | Discharge: 2016-07-27 | Disposition: A | Discharge status: Home / Self Care | Payer: Managed Care, Other (non HMO) | Attending: Emergency Medicine | Admitting: Emergency Medicine

## 2016-07-27 DIAGNOSIS — I10 Essential (primary) hypertension: Secondary | ICD-10-CM | POA: Insufficient documentation

## 2016-07-27 DIAGNOSIS — Z79899 Other long term (current) drug therapy: Secondary | ICD-10-CM | POA: Insufficient documentation

## 2016-07-27 DIAGNOSIS — R109 Unspecified abdominal pain: Secondary | ICD-10-CM | POA: Diagnosis present

## 2016-07-27 DIAGNOSIS — N390 Urinary tract infection, site not specified: Secondary | ICD-10-CM | POA: Insufficient documentation

## 2016-07-27 LAB — COMPREHENSIVE METABOLIC PANEL
ALK PHOS: 64 U/L (ref 38–126)
ALT: 18 U/L (ref 14–54)
AST: 17 U/L (ref 15–41)
Albumin: 3.6 g/dL (ref 3.5–5.0)
Anion gap: 9 (ref 5–15)
BUN: 14 mg/dL (ref 6–20)
CALCIUM: 9 mg/dL (ref 8.9–10.3)
CHLORIDE: 105 mmol/L (ref 101–111)
CO2: 24 mmol/L (ref 22–32)
CREATININE: 1.18 mg/dL — AB (ref 0.44–1.00)
GFR calc non Af Amer: 56 mL/min — ABNORMAL LOW (ref >60)
Glucose, Bld: 102 mg/dL — ABNORMAL HIGH (ref 65–99)
Potassium: 3.5 mmol/L (ref 3.5–5.1)
Sodium: 138 mmol/L (ref 135–145)
Total Bilirubin: 0.4 mg/dL (ref 0.3–1.2)
Total Protein: 7.2 g/dL (ref 6.5–8.1)

## 2016-07-27 LAB — URINALYSIS, ROUTINE W REFLEX MICROSCOPIC
Bilirubin Urine: NEGATIVE
GLUCOSE, UA: NEGATIVE mg/dL
Hgb urine dipstick: NEGATIVE
Ketones, ur: NEGATIVE mg/dL
Nitrite: NEGATIVE
PH: 5 (ref 5.0–8.0)
PROTEIN: 30 mg/dL — AB
Specific Gravity, Urine: 1.016 (ref 1.005–1.030)

## 2016-07-27 LAB — CBC
HCT: 36.8 % (ref 36.0–46.0)
Hemoglobin: 12.2 g/dL (ref 12.0–15.0)
MCH: 26.8 pg (ref 26.0–34.0)
MCHC: 33.2 g/dL (ref 30.0–36.0)
MCV: 80.7 fL (ref 78.0–100.0)
PLATELETS: 419 10*3/uL — AB (ref 150–400)
RBC: 4.56 MIL/uL (ref 3.87–5.11)
RDW: 15.3 % (ref 11.5–15.5)
WBC: 14.1 10*3/uL — AB (ref 4.0–10.5)

## 2016-07-27 LAB — I-STAT BETA HCG BLOOD, ED (MC, WL, AP ONLY): I-stat hCG, quantitative: <5 m[IU]/mL (ref <5)

## 2016-07-27 LAB — LIPASE, BLOOD: LIPASE: 14 U/L (ref 11–51)

## 2016-07-27 MED ORDER — CEPHALEXIN 500 MG PO CAPS: 500.0000 mg | ORAL_CAPSULE | Freq: Three times a day (TID) | ORAL | 0 refills | Status: AC

## 2016-07-27 MED ORDER — FENTANYL CITRATE (PF) 100 MCG/2ML IJ SOLN
50.0000 ug | Freq: Once | INTRAMUSCULAR | Status: AC
  Administered 2016-07-27: 50 ug via INTRAVENOUS
  Filled 2016-07-27: qty 2

## 2016-07-27 MED ORDER — ONDANSETRON 4 MG PO TBDP: 4.0000 mg | ORAL_TABLET | Freq: Three times a day (TID) | ORAL | 0 refills | Status: DC | PRN

## 2016-07-27 MED ORDER — SODIUM CHLORIDE 0.9 % IV BOLUS (SEPSIS)
1000.0000 mL | Freq: Once | INTRAVENOUS | Status: AC
  Administered 2016-07-27: 1000 mL via INTRAVENOUS

## 2016-07-27 MED ORDER — NAPROXEN 500 MG PO TABS: 500.0000 mg | ORAL_TABLET | Freq: Two times a day (BID) | ORAL | 0 refills | Status: AC

## 2016-07-27 MED ORDER — DEXTROSE 5 % IV SOLN
1.0000 g | Freq: Once | INTRAVENOUS | Status: AC
  Administered 2016-07-27: 1 g via INTRAVENOUS
  Filled 2016-07-27: qty 10

## 2016-07-27 MED ORDER — KETOROLAC TROMETHAMINE 30 MG/ML IJ SOLN
30.0000 mg | Freq: Once | INTRAMUSCULAR | Status: AC
  Administered 2016-07-27: 30 mg via INTRAVENOUS
  Filled 2016-07-27: qty 1

## 2016-07-27 MED ORDER — KETOROLAC TROMETHAMINE 60 MG/2ML IM SOLN: 60.0000 mg | Freq: Once | INTRAMUSCULAR | Status: DC

## 2016-07-27 MED ORDER — CEPHALEXIN 250 MG PO CAPS: 500.0000 mg | ORAL_CAPSULE | Freq: Once | ORAL | Status: DC

## 2016-07-27 NOTE — ED Notes (Signed)
Attempted PIV start x2 unsuccessful.

## 2016-07-27 NOTE — ED Provider Notes (Signed)
[facility] DEPT Provider Note   CSN: [id number] Arrival date & time: 07/27/16  1058     History   Chief Complaint Chief Complaint  Patient presents with  . Abdominal Pain    HPI Annette Deleon is a 41 y.o. female.  The history is provided by the patient.  Abdominal Pain   This is a new problem. The current episode started yesterday. The problem occurs constantly. The problem has not changed since onset.The pain is associated with an unknown factor. Pain location: left flank. The quality of the pain is aching. The pain is mild. Associated symptoms comments: Urinary frequency. Nothing aggravates the symptoms. Nothing relieves the symptoms.    Past Medical History:  Diagnosis Date  . Hypertension   . Migraines   . Obesity   . Pseudotumor cerebri   . Sleep apnea     Patient Active Problem List   Diagnosis Date Noted  . Near syncope 09/08/2014  . Complicated migraine XX123456  . HTN (hypertension) 11/20/2011  . Pseudotumor cerebri 11/20/2011  . Obesity 11/20/2011    Past Surgical History:  Procedure Laterality Date  . ARTHROSCOPIC REPAIR ACL    . CESAREAN SECTION    . CHOLECYSTECTOMY    . KNEE SURGERY     right  . MYOMECTOMY      OB History    No data available       Home Medications    Prior to Admission medications   Medication Sig Start Date End Date Taking? Authorizing Provider  amLODipine (NORVASC) 5 MG tablet Take 5 mg by mouth 2 (two) times daily.   Yes Historical Provider, MD  hydrALAZINE (APRESOLINE) 25 MG tablet Take 25 mg by mouth 2 (two) times daily.   Yes Historical Provider, MD  hydrochlorothiazide (HYDRODIURIL) 50 MG tablet Take 50 mg by mouth daily.  08/04/15  Yes Historical Provider, MD  naproxen sodium (ANAPROX) 220 MG tablet Take 440 mg by mouth 2 (two) times daily as needed (for pain).   Yes Historical Provider, MD  bismuth subsalicylate (PEPTO BISMOL) 262 MG/15ML suspension Take 30 mLs by mouth every 6 (six) hours as needed for  indigestion.    Historical Provider, MD  ondansetron (ZOFRAN) 4 MG tablet Take 1 tablet (4 mg total) by mouth every 6 (six) hours as needed for nausea or vomiting. Patient not taking: Reported on 07/27/2016 [id number]   Delora Fuel, MD  oxyCODONE-acetaminophen (PERCOCET) 5-325 MG tablet Take 1 tablet by mouth every 4 (four) hours as needed for moderate pain. Patient not taking: Reported on 07/27/2016 [id number]   Delora Fuel, MD    Family History No family history on file.  Social History Social History  Substance Use Topics  . Smoking status: Never Smoker  . Smokeless tobacco: Not on file  . Alcohol use Yes     Comment: occasionally     Allergies   Imitrex [sumatriptan base]; Prednisone; Wellbutrin [bupropion]; Yellow jacket venom [bee venom]; Ibuprofen; and Penicillins   Review of Systems Review of Systems  Gastrointestinal: Positive for abdominal pain.  All other systems reviewed and are negative.    Physical Exam Updated Vital Signs BP 141/94 (BP Location: Left Arm)   Pulse 92   Temp 99.5 F (37.5 C) (Oral)   Resp 24   Ht 5\' 3"  (1.6 m)   Wt 264 lb (119.7 kg)   LMP 07/20/2016   SpO2 99%   BMI 46.77 kg/m   Physical Exam  Constitutional: She is oriented to person, place, and  time. She appears well-developed and well-nourished. No distress.  HENT:  Head: Normocephalic.  Nose: Nose normal.  Eyes: Conjunctivae are normal.  Neck: Neck supple. No tracheal deviation present.  Cardiovascular: Normal rate, regular rhythm and normal heart sounds.   Pulmonary/Chest: Effort normal and breath sounds normal. No respiratory distress.  Abdominal: Soft. She exhibits no distension. There is no tenderness. There is no rebound and no guarding.  Neurological: She is alert and oriented to person, place, and time.  Skin: Skin is warm and dry.  Psychiatric: She has a normal mood and affect.  Vitals reviewed.    ED Treatments / Results  Labs (all labs ordered are listed, but only  abnormal results are displayed) Labs Reviewed  COMPREHENSIVE METABOLIC PANEL - Abnormal; Notable for the following:       Result Value   Glucose, Bld 102 (*)    Creatinine, Ser 1.18 (*)    GFR calc non Af Amer 56 (*)    All other components within normal limits  CBC - Abnormal; Notable for the following:    WBC 14.1 (*)    Platelets 419 (*)    All other components within normal limits  URINALYSIS, ROUTINE W REFLEX MICROSCOPIC - Abnormal; Notable for the following:    APPearance HAZY (*)    Protein, ur 30 (*)    Leukocytes, UA MODERATE (*)    Bacteria, UA RARE (*)    Squamous Epithelial / LPF 0-5 (*)    Non Squamous Epithelial 0-5 (*)    All other components within normal limits  LIPASE, BLOOD  I-STAT BETA HCG BLOOD, ED ([facility], [facility], [facility] ONLY)    EKG  EKG Interpretation None       Radiology No results found.  Procedures Procedures (including critical care time)  Medications Ordered in ED Medications  ketorolac (TORADOL) 30 MG/ML injection 30 mg (30 mg Intravenous Given 07/27/16 1327)  cefTRIAXone (ROCEPHIN) 1 g in dextrose 5 % 50 mL IVPB (0 g Intravenous Stopped 07/27/16 1357)  sodium chloride 0.9 % bolus 1,000 mL (0 mLs Intravenous Stopped 07/27/16 1509)  fentaNYL (SUBLIMAZE) injection 50 mcg (50 mcg Intravenous Given 07/27/16 1451)     Initial Impression / Assessment and Plan / ED Course  I have reviewed the triage vital signs and the nursing notes.  Pertinent labs & imaging results that were available during my care of the patient were reviewed by me and considered in my medical decision making (see chart for details).     41 y.o. female presents with lower abdominal and flank pain. Nausea without vomiting. Urinary Sx including frequency suggesting developing UTI. WBC elevation and heavy pyuria suggest early pyelo. Pt given dose of IV ABx here. Will treat empirically with keflex pending culture. Tolerating PO well. NSAIDs recommended for pain in interim. Plan to follow up  with PCP as needed and return precautions discussed for worsening or new concerning symptoms.   Final Clinical Impressions(s) / ED Diagnoses   Final diagnoses:  Urinary tract infection without hematuria, site unspecified    New Prescriptions Discharge Medication List as of 07/27/2016  2:19 PM    START taking these medications   Details  cephALEXin (KEFLEX) 500 MG capsule Take 1 capsule (500 mg total) by mouth 3 (three) times daily., Starting Sat 07/27/2016, Until Sat 08/03/2016, Print    naproxen (NAPROSYN) 500 MG tablet Take 1 tablet (500 mg total) by mouth 2 (two) times daily with a meal., Starting Sat 07/27/2016, Until Wed 07/31/2016, Print  ondansetron (ZOFRAN ODT) 4 MG disintegrating tablet Take 1 tablet (4 mg total) by mouth every 8 (eight) hours as needed for nausea or vomiting., Starting Sat 07/27/2016, Print         Leo Grosser, MD 07/28/16 [phone]

## 2016-07-27 NOTE — ED Notes (Signed)
Pt stable, understands discharge instructions, and reasons for return.   

## 2016-07-27 NOTE — ED Triage Notes (Signed)
Pt reports LLQ abd pain since last night. 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 Pain is constant. Denies hematuria, reports frequency. Denies n/v/d. Pain is worse with coughing or sneezing.

## 2016-07-28 LAB — URINE CULTURE: Culture: NO GROWTH

## 2016-10-05 ENCOUNTER — Emergency Department (HOSPITAL_COMMUNITY)
Admission: EM | Admit: 2016-10-05 | Discharge: 2016-10-05 | Disposition: A | Discharge status: Home / Self Care | Payer: Managed Care, Other (non HMO) | Attending: Emergency Medicine | Admitting: Emergency Medicine

## 2016-10-05 ENCOUNTER — Emergency Department (HOSPITAL_COMMUNITY): Payer: Managed Care, Other (non HMO)

## 2016-10-05 ENCOUNTER — Encounter (HOSPITAL_COMMUNITY): Payer: Self-pay

## 2016-10-05 DIAGNOSIS — I10 Essential (primary) hypertension: Secondary | ICD-10-CM | POA: Insufficient documentation

## 2016-10-05 DIAGNOSIS — R600 Localized edema: Secondary | ICD-10-CM | POA: Insufficient documentation

## 2016-10-05 DIAGNOSIS — R1013 Epigastric pain: Secondary | ICD-10-CM

## 2016-10-05 DIAGNOSIS — R002 Palpitations: Secondary | ICD-10-CM | POA: Diagnosis not present

## 2016-10-05 DIAGNOSIS — R0789 Other chest pain: Secondary | ICD-10-CM | POA: Diagnosis present

## 2016-10-05 DIAGNOSIS — Z79899 Other long term (current) drug therapy: Secondary | ICD-10-CM | POA: Insufficient documentation

## 2016-10-05 DIAGNOSIS — R072 Precordial pain: Secondary | ICD-10-CM

## 2016-10-05 LAB — CBC
HCT: 37.7 % (ref 36.0–46.0)
Hemoglobin: 12.4 g/dL (ref 12.0–15.0)
MCH: 26.2 pg (ref 26.0–34.0)
MCHC: 32.9 g/dL (ref 30.0–36.0)
MCV: 79.7 fL (ref 78.0–100.0)
PLATELETS: 446 10*3/uL — AB (ref 150–400)
RBC: 4.73 MIL/uL (ref 3.87–5.11)
RDW: 14.7 % (ref 11.5–15.5)
WBC: 10.9 10*3/uL — ABNORMAL HIGH (ref 4.0–10.5)

## 2016-10-05 LAB — BRAIN NATRIURETIC PEPTIDE: B Natriuretic Peptide: 19 pg/mL (ref 0.0–100.0)

## 2016-10-05 LAB — BASIC METABOLIC PANEL
Anion gap: 10 (ref 5–15)
BUN: 15 mg/dL (ref 6–20)
CALCIUM: 9.4 mg/dL (ref 8.9–10.3)
CO2: 25 mmol/L (ref 22–32)
CREATININE: 1.09 mg/dL — AB (ref 0.44–1.00)
Chloride: 99 mmol/L — ABNORMAL LOW (ref 101–111)
GFR calc non Af Amer: >60 mL/min (ref >60)
Glucose, Bld: 103 mg/dL — ABNORMAL HIGH (ref 65–99)
Potassium: 3.4 mmol/L — ABNORMAL LOW (ref 3.5–5.1)
Sodium: 134 mmol/L — ABNORMAL LOW (ref 135–145)

## 2016-10-05 LAB — LIPASE, BLOOD: Lipase: 20 U/L (ref 11–51)

## 2016-10-05 LAB — HEPATIC FUNCTION PANEL
ALK PHOS: 67 U/L (ref 38–126)
ALT: 32 U/L (ref 14–54)
AST: 24 U/L (ref 15–41)
Albumin: 3.8 g/dL (ref 3.5–5.0)
BILIRUBIN TOTAL: 0.5 mg/dL (ref 0.3–1.2)
Bilirubin, Direct: <0.1 mg/dL — ABNORMAL LOW (ref 0.1–0.5)
TOTAL PROTEIN: 8 g/dL (ref 6.5–8.1)

## 2016-10-05 LAB — I-STAT TROPONIN, ED: TROPONIN I, POC: 0.01 ng/mL (ref 0.00–0.08)

## 2016-10-05 MED ORDER — PANTOPRAZOLE SODIUM 20 MG PO TBEC: 20.0000 mg | DELAYED_RELEASE_TABLET | Freq: Every day | ORAL | 0 refills | Status: DC

## 2016-10-05 NOTE — ED Notes (Signed)
Chest still hurting  She has asked to taske her own aleve she has with her.  Dr has okd it

## 2016-10-05 NOTE — ED Notes (Signed)
Pt getting undressed and placed in gown for monitoring.

## 2016-10-05 NOTE — ED Triage Notes (Signed)
Patient complains of palpitations since last night with bilateral leg swelling, exertional shortness of breath, and left anterior chest tightness. No nausea, no vomiting, NAD

## 2016-10-06 NOTE — ED Provider Notes (Signed)
[place] [facility]PT [facility] Provider Note   CSN: [id number] Arrival date & time: 10/05/16  1319     History   Chief Complaint Chief Complaint  Patient presents with  . Chest Pain  . Shortness of Breath    HPI Annette Deleon is a 41 y.o. female.  HPI Patient reports that she has having little bursts of palpitations during the night prior to her evaluation. She reports this morning she also felt that she had tightness in her upper chest. She has noted swelling in both legs. She does not have associated pain in the legs.  Patient has more recently started working overnight shifts. She is a Marine scientist. She denies significant amount of caffeine consumption but does report caffeinated soda in the early morning hours. She reports she has been increasingly sleep deprived due to her schedule. Patient is not taking any additional over-the-counter medications, supplements or cold preparations.  No drug or alcohol use. Past Medical History:  Diagnosis Date  . Hypertension   . Migraines   . Obesity   . Pseudotumor cerebri   . Sleep apnea     Patient Active Problem List   Diagnosis Date Noted  . Near syncope 09/08/2014  . Complicated migraine 74/05/8785  . HTN (hypertension) 11/20/2011  . Pseudotumor cerebri 11/20/2011  . Obesity 11/20/2011    Past Surgical History:  Procedure Laterality Date  . ARTHROSCOPIC REPAIR ACL    . CESAREAN SECTION    . CHOLECYSTECTOMY    . KNEE SURGERY     right  . MYOMECTOMY      OB History    No data available       Home Medications    Prior to Admission medications   Medication Sig Start Date End Date Taking? Authorizing Provider  amLODipine (NORVASC) 10 MG tablet Take 10 mg by mouth daily.   Yes [provider]  bismuth subsalicylate (PEPTO BISMOL) 262 MG/15ML suspension Take 30 mLs by mouth every 6 (six) hours as needed for indigestion.   Yes [provider]  clindamycin (CLEOCIN) 300 MG capsule Take 300 mg by mouth 2 (two)  times daily. For 7 days, on day 6 of therapy   Yes [provider]  hydrALAZINE (APRESOLINE) 25 MG tablet Take 25 mg by mouth 2 (two) times daily.   Yes [provider]  hydrochlorothiazide (HYDRODIURIL) 25 MG tablet Take 50 mg by mouth daily.   Yes [provider]  labetalol (NORMODYNE) 200 MG tablet Take 200 mg by mouth daily. As needed for blood pressure   Yes [provider]  metroNIDAZOLE (FLAGYL) 500 MG tablet Take 500 mg by mouth 2 (two) times daily.   Yes [provider]  naproxen sodium (ANAPROX) 220 MG tablet Take 440 mg by mouth 2 (two) times daily as needed (for pain).   Yes [provider]  amLODipine (NORVASC) 5 MG tablet Take 5 mg by mouth 2 (two) times daily.    [provider]  hydrochlorothiazide (HYDRODIURIL) 50 MG tablet Take 50 mg by mouth daily.  08/04/15   [provider]  ondansetron (ZOFRAN ODT) 4 MG disintegrating tablet Take 1 tablet (4 mg total) by mouth every 8 (eight) hours as needed for nausea or vomiting. Patient not taking: Reported on 10/05/2016 07/27/16   Leo Grosser, MD  ondansetron (ZOFRAN) 4 MG tablet Take 1 tablet (4 mg total) by mouth every 6 (six) hours as needed for nausea or vomiting. Patient not taking: Reported on 07/27/2016 12/29/15  Delora Fuel, MD  oxyCODONE-acetaminophen (PERCOCET) 5-325 MG tablet Take 1 tablet by mouth every 4 (four) hours as needed for moderate pain. Patient not taking: Reported on 7/89/3810 1/75/10   Delora Fuel, MD  pantoprazole (PROTONIX) 20 MG tablet Take 1 tablet (20 mg total) by mouth daily. 10/05/16   Charlesetta Shanks, MD    Family History No family history on file.  Social History Social History  Substance Use Topics  . Smoking status: Never Smoker  . Smokeless tobacco: Not on file  . Alcohol use Yes     Comment: occasionally     Allergies   Imitrex [sumatriptan base]; Prednisone; Wellbutrin [bupropion]; Yellow jacket venom [bee venom];  Ibuprofen; and Penicillins   Review of Systems Review of Systems 10 Systems reviewed and are negative for acute change except as noted in the HPI.   Physical Exam Updated Vital Signs BP (!) 146/79 (BP Location: Right Arm)   Pulse 80   Temp 98.2 F (36.8 C) (Oral)   Resp 16   Ht 5\' 3"  (1.6 m)   Wt 278 lb (126.1 kg)   SpO2 100%   BMI 49.25 kg/m   Physical Exam  Constitutional: She is oriented to person, place, and time. She appears well-developed and well-nourished. No distress.  HENT:  Head: Normocephalic and atraumatic.  Mouth/Throat: Oropharynx is clear and moist.  Eyes: Conjunctivae and EOM are normal.  Neck: Neck supple.  Cardiovascular: Normal rate, regular rhythm, normal heart sounds and intact distal pulses.   No murmur heard. Pulmonary/Chest: Effort normal and breath sounds normal. No respiratory distress.  Abdominal: Soft. She exhibits no distension and no mass. There is no tenderness. There is no guarding.  Musculoskeletal: Normal range of motion. She exhibits edema.  1-2+ pitting edema bilateral lower extremities. No calf tenderness.  Neurological: She is alert and oriented to person, place, and time. No cranial nerve deficit. She exhibits normal muscle tone. Coordination normal.  Skin: Skin is warm and dry.  Psychiatric: She has a normal mood and affect.  Nursing note and vitals reviewed.    ED Treatments / Results  Labs (all labs ordered are listed, but only abnormal results are displayed) Labs Reviewed  BASIC METABOLIC PANEL - Abnormal; Notable for the following:       Result Value   Sodium 134 (*)    Potassium 3.4 (*)    Chloride 99 (*)    Glucose, Bld 103 (*)    Creatinine, Ser 1.09 (*)    All other components within normal limits  CBC - Abnormal; Notable for the following:    WBC 10.9 (*)    Platelets 446 (*)    All other components within normal limits  HEPATIC FUNCTION PANEL - Abnormal; Notable for the following:    Bilirubin, Direct <0.1  (*)    All other components within normal limits  BRAIN NATRIURETIC PEPTIDE  LIPASE, BLOOD  I-STAT TROPOININ, ED    EKG  EKG Interpretation  Date/Time:  Saturday Oct 05 2016 13:25:03 EDT Ventricular Rate:  100 PR Interval:  150 QRS Duration: 92 QT Interval:  354 QTC Calculation: 456 R Axis:     Text Interpretation:  Normal sinus rhythm Nonspecific ST abnormality Abnormal ECG agree. no sig change from previous Confirmed by Johnney Killian, MD, Jeannie Done [phone]) on 10/05/2016 3:23:33 PM       Radiology Dg Chest 2 View  Result Date: 10/05/2016 CLINICAL DATA:  Left-sided chest pain. EXAM: CHEST  2 VIEW COMPARISON:  November 17, 2015 FINDINGS: The  heart size and mediastinal contours are within normal limits. Both lungs are clear. The visualized skeletal structures are unremarkable. IMPRESSION: No active cardiopulmonary disease. Electronically Signed   By: Dorise Bullion III M.D   On: 10/05/2016 14:07    Procedures Procedures (including critical care time)  Medications Ordered in ED Medications - No data to display   Initial Impression / Assessment and Plan / ED Course  I have reviewed the triage vital signs and the nursing notes.  Pertinent labs & imaging results that were available during my care of the patient were reviewed by me and considered in my medical decision making (see chart for details).      Final Clinical Impressions(s) / ED Diagnoses   Final diagnoses:  Epigastric pain  Precordial pain  Palpitations   Currently patient is well-known appearance. Diagnostic studies do not indicate acute ischemia. Low suspicion for PE. Patient has more recently started hydralazine which she thought might be causing the symptoms. She is also taking amlodipine which may be contributing to her lower extremity edema. Plan will be for the patient to follow-up with cardiology and PCP. New Prescriptions Discharge Medication List as of 10/05/2016  4:55 PM    START taking these medications    Details  pantoprazole (PROTONIX) 20 MG tablet Take 1 tablet (20 mg total) by mouth daily., Starting Sat 10/05/2016, Print         Charlesetta Shanks, MD 10/06/16 [phone]

## 2016-10-12 ENCOUNTER — Emergency Department (HOSPITAL_COMMUNITY)
Admission: EM | Admit: 2016-10-12 | Discharge: 2016-10-13 | Disposition: A | Discharge status: Home / Self Care | Payer: Managed Care, Other (non HMO) | Attending: Emergency Medicine | Admitting: Emergency Medicine

## 2016-10-12 ENCOUNTER — Encounter (HOSPITAL_COMMUNITY): Payer: Self-pay | Admitting: *Deleted

## 2016-10-12 ENCOUNTER — Emergency Department (HOSPITAL_COMMUNITY): Payer: Managed Care, Other (non HMO)

## 2016-10-12 DIAGNOSIS — Y999 Unspecified external cause status: Secondary | ICD-10-CM | POA: Diagnosis not present

## 2016-10-12 DIAGNOSIS — Y9389 Activity, other specified: Secondary | ICD-10-CM | POA: Insufficient documentation

## 2016-10-12 DIAGNOSIS — S63501A Unspecified sprain of right wrist, initial encounter: Secondary | ICD-10-CM

## 2016-10-12 DIAGNOSIS — Y929 Unspecified place or not applicable: Secondary | ICD-10-CM | POA: Insufficient documentation

## 2016-10-12 DIAGNOSIS — W1839XA Other fall on same level, initial encounter: Secondary | ICD-10-CM | POA: Insufficient documentation

## 2016-10-12 DIAGNOSIS — S80212A Abrasion, left knee, initial encounter: Secondary | ICD-10-CM | POA: Diagnosis not present

## 2016-10-12 DIAGNOSIS — I10 Essential (primary) hypertension: Secondary | ICD-10-CM | POA: Insufficient documentation

## 2016-10-12 DIAGNOSIS — S60221A Contusion of right hand, initial encounter: Secondary | ICD-10-CM | POA: Insufficient documentation

## 2016-10-12 DIAGNOSIS — Z79899 Other long term (current) drug therapy: Secondary | ICD-10-CM | POA: Diagnosis not present

## 2016-10-12 DIAGNOSIS — T07XXXA Unspecified multiple injuries, initial encounter: Secondary | ICD-10-CM

## 2016-10-12 DIAGNOSIS — S6991XA Unspecified injury of right wrist, hand and finger(s), initial encounter: Secondary | ICD-10-CM | POA: Diagnosis present

## 2016-10-12 DIAGNOSIS — W19XXXA Unspecified fall, initial encounter: Secondary | ICD-10-CM

## 2016-10-12 NOTE — ED Triage Notes (Signed)
Pt was playing w/ kids & she fell c/o right hand pain. Abrasion on left palm & knee.

## 2016-10-13 MED ORDER — HYDROCODONE-ACETAMINOPHEN 5-325 MG PO TABS: 1.0000 | ORAL_TABLET | ORAL | 0 refills | Status: DC | PRN

## 2016-10-13 MED ORDER — ONDANSETRON HCL 4 MG PO TABS
4.0000 mg | ORAL_TABLET | Freq: Once | ORAL | Status: AC
  Administered 2016-10-13: 4 mg via ORAL
  Filled 2016-10-13: qty 1

## 2016-10-13 MED ORDER — HYDROCODONE-ACETAMINOPHEN 5-325 MG PO TABS
2.0000 | ORAL_TABLET | Freq: Once | ORAL | Status: AC
  Administered 2016-10-13: 2 via ORAL
  Filled 2016-10-13: qty 2

## 2016-10-13 NOTE — Discharge Instructions (Signed)
The x-ray of your right hand and wrist show some mild volar right wrist soft tissue swelling, but no fracture or dislocation is seen. There no significant neurologic or vascular problem noted. Please use your wrist splint and apply ice to your hand and wrist. Elevated as much as possible. Use Norco every 4 hours as needed for pain. This medication may cause drowsiness, please use it with caution. Please see Ms. Mel Almond for additional evaluation  concerning pain following a fall. Please return to the emergency department if any changes, problems, or concerns.

## 2016-10-13 NOTE — ED Provider Notes (Signed)
[place] DEPT Provider Note   CSN: [id number] Arrival date & time: 10/12/16  2246     History   Chief Complaint Chief Complaint  Patient presents with  . Fall    HPI Annette Deleon is a 41 y.o. female.  Patient is a 40 year old female who presents to the emergency department with a complaint of right hand and wrist pain left palm pain and left knee abrasions. The patient states that she was playing with her children when she fell and injured the right hand the left palm and the left knee. She denies being on any anticoagulation medications. She's not had any recent operations or procedures on right or left knee. She has tried conservative measures and these have not been effective.      Past Medical History:  Diagnosis Date  . Hypertension   . Migraines   . Obesity   . Pseudotumor cerebri   . Sleep apnea     Patient Active Problem List   Diagnosis Date Noted  . Near syncope 09/08/2014  . Complicated migraine 23/76/2831  . HTN (hypertension) 11/20/2011  . Pseudotumor cerebri 11/20/2011  . Obesity 11/20/2011    Past Surgical History:  Procedure Laterality Date  . ARTHROSCOPIC REPAIR ACL    . CESAREAN SECTION    . CHOLECYSTECTOMY    . KNEE SURGERY     right  . MYOMECTOMY      OB History    No data available       Home Medications    Prior to Admission medications   Medication Sig Start Date End Date Taking? Authorizing Provider  amLODipine (NORVASC) 10 MG tablet Take 10 mg by mouth daily.   Yes [provider]  amLODipine (NORVASC) 5 MG tablet Take 5 mg by mouth 2 (two) times daily.   Yes [provider]  bismuth subsalicylate (PEPTO BISMOL) 262 MG/15ML suspension Take 30 mLs by mouth every 6 (six) hours as needed for indigestion.   Yes [provider]  clindamycin (CLEOCIN) 300 MG capsule Take 300 mg by mouth 2 (two) times daily. For 7 days, on day 6 of therapy   Yes [provider]  hydrochlorothiazide  (HYDRODIURIL) 50 MG tablet Take 50 mg by mouth daily.  08/04/15  Yes [provider]  labetalol (NORMODYNE) 200 MG tablet Take 200 mg by mouth daily. As needed for blood pressure   Yes [provider]  losartan-hydrochlorothiazide (HYZAAR) 100-25 MG tablet Take 1 tablet by mouth daily.   Yes [provider]  metroNIDAZOLE (FLAGYL) 500 MG tablet Take 500 mg by mouth 2 (two) times daily.   Yes [provider]  naproxen sodium (ANAPROX) 220 MG tablet Take 440 mg by mouth 2 (two) times daily as needed (for pain).   Yes [provider]  ondansetron (ZOFRAN ODT) 4 MG disintegrating tablet Take 1 tablet (4 mg total) by mouth every 8 (eight) hours as needed for nausea or vomiting. 07/27/16  Yes Leo Grosser, MD  ondansetron (ZOFRAN) 4 MG tablet Take 1 tablet (4 mg total) by mouth every 6 (six) hours as needed for nausea or vomiting. 10/18/59  Yes Delora Fuel, MD  oxyCODONE-acetaminophen (PERCOCET) 5-325 MG tablet Take 1 tablet by mouth every 4 (four) hours as needed for moderate pain. 11/08/35  Yes Delora Fuel, MD  pantoprazole (PROTONIX) 20 MG tablet Take 1 tablet (20 mg total) by mouth daily. 10/05/16  Yes Charlesetta Shanks, MD  hydrALAZINE (APRESOLINE) 25 MG tablet Take 25 mg by mouth  2 (two) times daily.    [provider]  hydrochlorothiazide (HYDRODIURIL) 25 MG tablet Take 50 mg by mouth daily.    [provider]  HYDROcodone-acetaminophen (NORCO/VICODIN) 5-325 MG tablet Take 1 tablet by mouth every 4 (four) hours as needed. 10/13/16   Lily Kocher, PA-C    Family History No family history on file.  Social History Social History  Substance Use Topics  . Smoking status: Never Smoker  . Smokeless tobacco: Never Used  . Alcohol use Yes     Comment: occasionally     Allergies   Imitrex [sumatriptan base]; Prednisone; Wellbutrin [bupropion]; Yellow jacket venom [bee venom]; Ibuprofen; and Penicillins   Review of Systems Review of  Systems  Musculoskeletal:       Right upper extremity pain.  All other systems reviewed and are negative.    Physical Exam Updated Vital Signs BP (!) 144/83 (BP Location: Left Arm)   Pulse (!) 127   Temp 99.2 F (37.3 C) (Oral)   Resp (!) 21   Ht 5\' 3"  (1.6 m)   Wt 126.1 kg   SpO2 98%   BMI 49.25 kg/m   Physical Exam  Constitutional: She is oriented to person, place, and time. She appears well-developed and well-nourished.  Non-toxic appearance.  HENT:  Head: Normocephalic.  Right Ear: Tympanic membrane and external ear normal.  Left Ear: Tympanic membrane and external ear normal.  Eyes: EOM and lids are normal. Pupils are equal, round, and reactive to light.  Neck: Normal range of motion. Neck supple. Carotid bruit is not present.  Cardiovascular: Normal rate, regular rhythm, normal heart sounds, intact distal pulses and normal pulses.   Pulmonary/Chest: Breath sounds normal. No respiratory distress.  Abdominal: Soft. Bowel sounds are normal. There is no tenderness. There is no guarding.  Musculoskeletal:       Right shoulder: Normal.       Right elbow: Normal.      Right wrist: She exhibits decreased range of motion and tenderness.       Right hand: She exhibits decreased range of motion and tenderness. She exhibits normal capillary refill and no deformity.       Hands:      Legs: Lymphadenopathy:       Head (right side): No submandibular adenopathy present.       Head (left side): No submandibular adenopathy present.    She has no cervical adenopathy.  Neurological: She is alert and oriented to person, place, and time. She has normal strength. No cranial nerve deficit or sensory deficit.  Pt is ambulatory.  Skin: Skin is warm and dry.  Psychiatric: She has a normal mood and affect. Her speech is normal.  Nursing note and vitals reviewed.    ED Treatments / Results  Labs (all labs ordered are listed, but only abnormal results are displayed) Labs Reviewed - No  data to display  EKG  EKG Interpretation None       Radiology Dg Wrist Complete Right  Result Date: 10/12/2016 CLINICAL DATA:  Right wrist pain after fall. EXAM: RIGHT WRIST - COMPLETE 3+ VIEW COMPARISON:  None. FINDINGS: Mild volar right wrist soft tissue swelling. No fracture or dislocation. No suspicious focal osseous lesion. No significant arthropathy. No radiopaque foreign body. IMPRESSION: Mild volar right wrist soft tissue swelling, with no fracture or dislocation. Electronically Signed   By: Ilona Sorrel M.D.   On: 10/12/2016 23:55   Dg Hand Complete Right  Result Date: 10/12/2016 CLINICAL DATA:  Fall.  Right hand and wrist pain. EXAM: RIGHT HAND - COMPLETE 3+ VIEW COMPARISON:  None. FINDINGS: There is no evidence of fracture or dislocation. There is no evidence of arthropathy or other focal bone abnormality. Soft tissues are unremarkable. IMPRESSION: No right hand fracture or dislocation. Electronically Signed   By: Ilona Sorrel M.D.   On: 10/12/2016 23:52    Procedures Procedures (including critical care time)  Medications Ordered in ED Medications  HYDROcodone-acetaminophen (NORCO/VICODIN) 5-325 MG per tablet 2 tablet (2 tablets Oral Given 10/13/16 0147)  ondansetron (ZOFRAN) tablet 4 mg (4 mg Oral Given 10/13/16 0147)     Initial Impression / Assessment and Plan / ED Course  I have reviewed the triage vital signs and the nursing notes.  Pertinent labs & imaging results that were available during my care of the patient were reviewed by me and considered in my medical decision making (see chart for details).       Final Clinical Impressions(s) / ED Diagnoses MDM Xray of the wrist and hand are neg for fx or dislocation. No evidence of dislocation of the shoulder or other problem. No neurovascular deficits. Test result discussed with pt. Pt to follow up with PCP if any changes or problem.  Pt in agreement with plan.   Final diagnoses:  Contusion of right hand,  initial encounter  Sprain of right wrist, initial encounter  Abrasions of multiple sites  Fall, initial encounter    New Prescriptions New Prescriptions   HYDROCODONE-ACETAMINOPHEN (NORCO/VICODIN) 5-325 MG TABLET    Take 1 tablet by mouth every 4 (four) hours as needed.     Lily Kocher, PA-C 86/57/84 [id number]    Delora Fuel, MD 95/28/41 [phone]

## 2016-11-13 ENCOUNTER — Emergency Department (HOSPITAL_COMMUNITY): Payer: Managed Care, Other (non HMO)

## 2016-11-13 ENCOUNTER — Encounter (HOSPITAL_COMMUNITY): Payer: Self-pay | Admitting: Emergency Medicine

## 2016-11-13 ENCOUNTER — Emergency Department (HOSPITAL_COMMUNITY)
Admission: EM | Admit: 2016-11-13 | Discharge: 2016-11-13 | Disposition: A | Discharge status: Home / Self Care | Payer: Managed Care, Other (non HMO) | Attending: Emergency Medicine | Admitting: Emergency Medicine

## 2016-11-13 DIAGNOSIS — G441 Vascular headache, not elsewhere classified: Secondary | ICD-10-CM | POA: Diagnosis not present

## 2016-11-13 DIAGNOSIS — Z79899 Other long term (current) drug therapy: Secondary | ICD-10-CM | POA: Insufficient documentation

## 2016-11-13 DIAGNOSIS — R51 Headache: Secondary | ICD-10-CM | POA: Diagnosis present

## 2016-11-13 DIAGNOSIS — I10 Essential (primary) hypertension: Secondary | ICD-10-CM | POA: Insufficient documentation

## 2016-11-13 MED ORDER — DIPHENHYDRAMINE HCL 50 MG/ML IJ SOLN
12.5000 mg | Freq: Once | INTRAMUSCULAR | Status: AC
  Administered 2016-11-13: 12.5 mg via INTRAVENOUS
  Filled 2016-11-13: qty 1

## 2016-11-13 MED ORDER — MORPHINE SULFATE (PF) 2 MG/ML IV SOLN
4.0000 mg | Freq: Once | INTRAVENOUS | Status: AC
  Administered 2016-11-13: 4 mg via INTRAVENOUS
  Filled 2016-11-13: qty 2

## 2016-11-13 MED ORDER — SODIUM CHLORIDE 0.9 % IV BOLUS (SEPSIS)
1000.0000 mL | Freq: Once | INTRAVENOUS | Status: AC
  Administered 2016-11-13: 1000 mL via INTRAVENOUS

## 2016-11-13 MED ORDER — METOCLOPRAMIDE HCL 5 MG/ML IJ SOLN
10.0000 mg | Freq: Once | INTRAMUSCULAR | Status: AC
  Administered 2016-11-13: 10 mg via INTRAVENOUS
  Filled 2016-11-13: qty 2

## 2016-11-13 MED ORDER — SODIUM CHLORIDE 0.9 % IV SOLN
INTRAVENOUS | Status: DC
  Administered 2016-11-13: 20:00:00 via INTRAVENOUS

## 2016-11-13 NOTE — ED Triage Notes (Signed)
Pt from home with complaint of headache that has been going on and off x 5 days. Pt states she has hx of pseudotumor cerebri. Pt states she heard a "pop" and her pain spread from her left occipital region to her right frontal region as well. Pt has hx of migraines, but states this feels different from normal migraine symptoms. Pt had elevated stress levels at home today which caused her bp to spike to 221/123 and she took a labetalol which lowered her blood pressure. Pt is still hypertensive at time of assessment.

## 2016-11-13 NOTE — ED Provider Notes (Signed)
[place] DEPT Provider Note   CSN: [id number] Arrival date & time: 11/13/16  1735     History   Chief Complaint Chief Complaint  Patient presents with  . Headache  . Blurred Vision    HPI Annette Deleon is a 41 y.o. female.  41 year old female presents with acute onset of left-sided headache that began after she became upset with her son. Has a history of chronic headaches secondary to her pseudotumor cerebri. Patient does not take Diamox because she can't tolerated. Has had some blurred vision but denies any vision loss. States that she has migraines as result of this as well. She denies any fever, neck pain. Has a photophobia and slight phonophobia. When she became upset today she took her blood pressure was elevated and she felt a pop in her head. Symptoms started less than 6 hours ago. No trauma use prior to arrival      Past Medical History:  Diagnosis Date  . Hypertension   . Migraines   . Obesity   . Pseudotumor cerebri   . Sleep apnea     Patient Active Problem List   Diagnosis Date Noted  . Near syncope 09/08/2014  . Complicated migraine 32/95/1884  . HTN (hypertension) 11/20/2011  . Pseudotumor cerebri 11/20/2011  . Obesity 11/20/2011    Past Surgical History:  Procedure Laterality Date  . ARTHROSCOPIC REPAIR ACL    . CESAREAN SECTION    . CHOLECYSTECTOMY    . KNEE SURGERY     right  . MYOMECTOMY      OB History    No data available       Home Medications    Prior to Admission medications   Medication Sig Start Date End Date Taking? Authorizing Provider  acetaminophen (TYLENOL) 500 MG tablet Take 1,000 mg by mouth every 6 (six) hours as needed.   Yes [provider]  citalopram (CELEXA) 20 MG tablet Take 20 mg by mouth daily.   Yes [provider]  labetalol (NORMODYNE) 200 MG tablet Take 200 mg by mouth daily. As needed for blood pressure   Yes [provider]  losartan-hydrochlorothiazide (HYZAAR) 100-25 MG  tablet Take 1 tablet by mouth daily.   Yes [provider]  HYDROcodone-acetaminophen (NORCO/VICODIN) 5-325 MG tablet Take 1 tablet by mouth every 4 (four) hours as needed. Patient not taking: Reported on 11/13/2016 10/13/16   Lily Kocher, PA-C  ondansetron (ZOFRAN ODT) 4 MG disintegrating tablet Take 1 tablet (4 mg total) by mouth every 8 (eight) hours as needed for nausea or vomiting. Patient not taking: Reported on 11/13/2016 07/27/16   Leo Grosser, MD  ondansetron (ZOFRAN) 4 MG tablet Take 1 tablet (4 mg total) by mouth every 6 (six) hours as needed for nausea or vomiting. Patient not taking: Reported on 1/66/0630 1/60/10   Delora Fuel, MD  oxyCODONE-acetaminophen (PERCOCET) 5-325 MG tablet Take 1 tablet by mouth every 4 (four) hours as needed for moderate pain. Patient not taking: Reported on 9/32/3557 08/22/00   Delora Fuel, MD  pantoprazole (PROTONIX) 20 MG tablet Take 1 tablet (20 mg total) by mouth daily. Patient not taking: Reported on 11/13/2016 10/05/16   Charlesetta Shanks, MD    Family History No family history on file.  Social History Social History  Substance Use Topics  . Smoking status: Never Smoker  . Smokeless tobacco: Never Used  . Alcohol use Yes     Comment: occasionally     Allergies   Imitrex [sumatriptan base]; Prednisone;  Wellbutrin [bupropion]; Yellow jacket venom [bee venom]; Ibuprofen; and Penicillins   Review of Systems Review of Systems  All other systems reviewed and are negative.    Physical Exam Updated Vital Signs BP (!) 142/74 (BP Location: Right Arm)   Pulse 95   Temp 97.5 F (36.4 C) (Oral)   Resp 16   SpO2 99%   Physical Exam  Constitutional: She is oriented to person, place, and time. She appears well-developed and well-nourished.  Non-toxic appearance. No distress.  HENT:  Head: Normocephalic and atraumatic.  Eyes: Conjunctivae, EOM and lids are normal. Pupils are equal, round, and reactive to light.  Neck: Normal range  of motion. Neck supple. No tracheal deviation present. No thyroid mass present.  Cardiovascular: Normal rate, regular rhythm and normal heart sounds.  Exam reveals no gallop.   No murmur heard. Pulmonary/Chest: Effort normal and breath sounds normal. No stridor. No respiratory distress. She has no decreased breath sounds. She has no wheezes. She has no rhonchi. She has no rales.  Abdominal: Soft. Normal appearance and bowel sounds are normal. She exhibits no distension. There is no tenderness. There is no rebound and no CVA tenderness.  Musculoskeletal: Normal range of motion. She exhibits no edema or tenderness.  Neurological: She is alert and oriented to person, place, and time. She has normal strength. No cranial nerve deficit or sensory deficit. GCS eye subscore is 4. GCS verbal subscore is 5. GCS motor subscore is 6.  Skin: Skin is warm and dry. No abrasion and no rash noted.  Psychiatric: She has a normal mood and affect. Her speech is normal and behavior is normal.  Nursing note and vitals reviewed.    ED Treatments / Results  Labs (all labs ordered are listed, but only abnormal results are displayed) Labs Reviewed - No data to display  EKG  EKG Interpretation None       Radiology No results found.  Procedures Procedures (including critical care time)  Medications Ordered in ED Medications  sodium chloride 0.9 % bolus 1,000 mL (not administered)  0.9 %  sodium chloride infusion (not administered)  metoCLOPramide (REGLAN) injection 10 mg (not administered)  diphenhydrAMINE (BENADRYL) injection 12.5 mg (not administered)  morphine 2 MG/ML injection 4 mg (not administered)     Initial Impression / Assessment and Plan / ED Course  I have reviewed the triage vital signs and the nursing notes.  Pertinent labs & imaging results that were available during my care of the patient were reviewed by me and considered in my medical decision making (see chart for details).      Head CT was negative. Given medications for migraines was much better. She has no focal neurological deficits. I do not believe that this was a subarachnoid hemorrhage. Her vision she says is at her baseline. Not concern for complications of pseudotumor cerebri. She states that she will follow with her neurologist  Final Clinical Impressions(s) / ED Diagnoses   Final diagnoses:  None    New Prescriptions New Prescriptions   No medications on file     Lacretia Leigh, MD 11/13/16 2144

## 2016-12-11 ENCOUNTER — Emergency Department (HOSPITAL_COMMUNITY): Payer: Managed Care, Other (non HMO)

## 2016-12-11 ENCOUNTER — Emergency Department (HOSPITAL_COMMUNITY)
Admission: EM | Admit: 2016-12-11 | Discharge: 2016-12-12 | Disposition: A | Discharge status: Home / Self Care | Payer: Managed Care, Other (non HMO) | Attending: Emergency Medicine | Admitting: Emergency Medicine

## 2016-12-11 ENCOUNTER — Encounter (HOSPITAL_COMMUNITY): Payer: Self-pay | Admitting: Emergency Medicine

## 2016-12-11 DIAGNOSIS — R112 Nausea with vomiting, unspecified: Secondary | ICD-10-CM | POA: Insufficient documentation

## 2016-12-11 DIAGNOSIS — R102 Pelvic and perineal pain: Secondary | ICD-10-CM | POA: Diagnosis not present

## 2016-12-11 DIAGNOSIS — Z79899 Other long term (current) drug therapy: Secondary | ICD-10-CM | POA: Insufficient documentation

## 2016-12-11 DIAGNOSIS — R1030 Lower abdominal pain, unspecified: Secondary | ICD-10-CM | POA: Diagnosis not present

## 2016-12-11 DIAGNOSIS — I10 Essential (primary) hypertension: Secondary | ICD-10-CM | POA: Insufficient documentation

## 2016-12-11 LAB — COMPREHENSIVE METABOLIC PANEL
ALBUMIN: 4.3 g/dL (ref 3.5–5.0)
ALT: 17 U/L (ref 14–54)
ANION GAP: 11 (ref 5–15)
AST: 18 U/L (ref 15–41)
Alkaline Phosphatase: 67 U/L (ref 38–126)
BUN: 15 mg/dL (ref 6–20)
CHLORIDE: 104 mmol/L (ref 101–111)
CO2: 21 mmol/L — ABNORMAL LOW (ref 22–32)
Calcium: 9.2 mg/dL (ref 8.9–10.3)
Creatinine, Ser: 1.08 mg/dL — ABNORMAL HIGH (ref 0.44–1.00)
GFR calc Af Amer: >60 mL/min (ref >60)
Glucose, Bld: 88 mg/dL (ref 65–99)
POTASSIUM: 3.7 mmol/L (ref 3.5–5.1)
Sodium: 136 mmol/L (ref 135–145)
Total Bilirubin: 0.5 mg/dL (ref 0.3–1.2)
Total Protein: 8.2 g/dL — ABNORMAL HIGH (ref 6.5–8.1)

## 2016-12-11 LAB — LIPASE, BLOOD: LIPASE: 22 U/L (ref 11–51)

## 2016-12-11 LAB — CBC
HEMATOCRIT: 38.3 % (ref 36.0–46.0)
HEMOGLOBIN: 13.3 g/dL (ref 12.0–15.0)
MCH: 27.8 pg (ref 26.0–34.0)
MCHC: 34.7 g/dL (ref 30.0–36.0)
MCV: 80.1 fL (ref 78.0–100.0)
Platelets: 430 10*3/uL — ABNORMAL HIGH (ref 150–400)
RBC: 4.78 MIL/uL (ref 3.87–5.11)
RDW: 14.8 % (ref 11.5–15.5)
WBC: 13.7 10*3/uL — AB (ref 4.0–10.5)

## 2016-12-11 LAB — I-STAT BETA HCG BLOOD, ED (MC, WL, AP ONLY): I-stat hCG, quantitative: <5 m[IU]/mL (ref <5)

## 2016-12-11 MED ORDER — IOPAMIDOL (ISOVUE-300) INJECTION 61%
INTRAVENOUS | Status: AC
  Filled 2016-12-11: qty 30

## 2016-12-11 MED ORDER — IOPAMIDOL (ISOVUE-300) INJECTION 61%
30.0000 mL | Freq: Once | INTRAVENOUS | Status: AC | PRN
  Administered 2016-12-11: 30 mL via ORAL

## 2016-12-11 MED ORDER — IOPAMIDOL (ISOVUE-300) INJECTION 61%
INTRAVENOUS | Status: AC
  Filled 2016-12-11: qty 100

## 2016-12-11 MED ORDER — LORAZEPAM 2 MG/ML IJ SOLN
1.0000 mg | Freq: Once | INTRAMUSCULAR | Status: AC
  Administered 2016-12-11: 1 mg via INTRAVENOUS
  Filled 2016-12-11: qty 1

## 2016-12-11 MED ORDER — SODIUM CHLORIDE 0.9 % IV SOLN
INTRAVENOUS | Status: DC
  Administered 2016-12-11: 22:00:00 via INTRAVENOUS

## 2016-12-11 MED ORDER — HYDROMORPHONE HCL 1 MG/ML IJ SOLN
1.0000 mg | Freq: Once | INTRAMUSCULAR | Status: AC
  Administered 2016-12-11: 1 mg via INTRAVENOUS
  Filled 2016-12-11: qty 1

## 2016-12-11 MED ORDER — MORPHINE SULFATE (PF) 4 MG/ML IV SOLN
4.0000 mg | Freq: Once | INTRAVENOUS | Status: AC
  Administered 2016-12-11: 4 mg via INTRAVENOUS
  Filled 2016-12-11: qty 1

## 2016-12-11 MED ORDER — LORAZEPAM 2 MG/ML IJ SOLN
0.5000 mg | Freq: Once | INTRAMUSCULAR | Status: AC
  Administered 2016-12-11: 0.5 mg via INTRAVENOUS
  Filled 2016-12-11: qty 1

## 2016-12-11 MED ORDER — IOPAMIDOL (ISOVUE-300) INJECTION 61%
100.0000 mL | Freq: Once | INTRAVENOUS | Status: AC | PRN
  Administered 2016-12-11: 100 mL via INTRAVENOUS

## 2016-12-11 NOTE — ED Triage Notes (Signed)
Pt c/o nausea, intermittent stabbing cramping RLQ abdominal pain radiating to back onset yesterday. Initially pain was generalized and felt similar to menstrual cramps but now much worse and localized to RLQ. Pain improved with movement and certain positions when come on. No urinary symptoms, no vaginal discharge. Took Midol last night with some relief. Rebound tenderness.

## 2016-12-11 NOTE — ED Provider Notes (Addendum)
[facility] DEPT Provider Note   CSN: [id number] Arrival date & time: 12/11/16  1746     History   Chief Complaint Chief Complaint  Patient presents with  . Abdominal Pain    HPI Annette Deleon is a 41 y.o. female.  41 year old female presents with intermittent right lower quadrant abdominal pain. Pain characterized as sharp and last for 5-10 seconds. Symptoms have been waxing and waning and nothing makes them better or worse. Had emesis 1 without fever or chills. No vaginal bleeding or discharge. Denies any dysuria or hematuria. No prior history of same and no treatment use prior to arrival      Past Medical History:  Diagnosis Date  . Hypertension   . Migraines   . Obesity   . Pseudotumor cerebri   . Sleep apnea     Patient Active Problem List   Diagnosis Date Noted  . Near syncope 09/08/2014  . Complicated migraine 62/13/0865  . HTN (hypertension) 11/20/2011  . Pseudotumor cerebri 11/20/2011  . Obesity 11/20/2011    Past Surgical History:  Procedure Laterality Date  . ARTHROSCOPIC REPAIR ACL    . CESAREAN SECTION    . CHOLECYSTECTOMY    . KNEE SURGERY     right  . MYOMECTOMY      OB History    No data available       Home Medications    Prior to Admission medications   Medication Sig Start Date End Date Taking? Authorizing Provider  albuterol (PROVENTIL HFA;VENTOLIN HFA) 108 (90 Base) MCG/ACT inhaler Inhale 1 puff into the lungs every 6 (six) hours as needed for wheezing or shortness of breath.  11/19/16  Yes [provider]  labetalol (NORMODYNE) 200 MG tablet Take 200 mg by mouth daily as needed (high blood pressure). As needed for blood pressure    Yes [provider]  losartan-hydrochlorothiazide (HYZAAR) 100-25 MG tablet Take 1 tablet by mouth daily.   Yes [provider]  HYDROcodone-acetaminophen (NORCO/VICODIN) 5-325 MG tablet Take 1 tablet by mouth every 4 (four) hours as needed. Patient not taking: Reported  on 11/13/2016 10/13/16   Lily Kocher, PA-C  ondansetron (ZOFRAN ODT) 4 MG disintegrating tablet Take 1 tablet (4 mg total) by mouth every 8 (eight) hours as needed for nausea or vomiting. Patient not taking: Reported on 11/13/2016 07/27/16   Leo Grosser, MD  ondansetron (ZOFRAN) 4 MG tablet Take 1 tablet (4 mg total) by mouth every 6 (six) hours as needed for nausea or vomiting. Patient not taking: Reported on 7/84/6962 9/52/84   Delora Fuel, MD  oxyCODONE-acetaminophen (PERCOCET) 5-325 MG tablet Take 1 tablet by mouth every 4 (four) hours as needed for moderate pain. Patient not taking: Reported on 1/32/4401 0/27/25   Delora Fuel, MD  pantoprazole (PROTONIX) 20 MG tablet Take 1 tablet (20 mg total) by mouth daily. Patient not taking: Reported on 11/13/2016 10/05/16   Charlesetta Shanks, MD    Family History History reviewed. No pertinent family history.  Social History Social History  Substance Use Topics  . Smoking status: Never Smoker  . Smokeless tobacco: Never Used  . Alcohol use Yes     Comment: occasionally     Allergies   Imitrex [sumatriptan base]; Prednisone; Wellbutrin [bupropion]; Yellow jacket venom [bee venom]; Ibuprofen; and Penicillins   Review of Systems Review of Systems  All other systems reviewed and are negative.    Physical Exam Updated Vital Signs BP (!) 191/117 (BP Location: Left Arm)   Pulse 95  Temp 98.9 F (37.2 C) (Oral)   Resp 20   Ht 1.6 m (5\' 3" )   Wt 133.4 kg (294 lb)   SpO2 100%   BMI 52.08 kg/m   Physical Exam  Constitutional: She is oriented to person, place, and time. She appears well-developed and well-nourished.  Non-toxic appearance. No distress.  HENT:  Head: Normocephalic and atraumatic.  Eyes: Conjunctivae, EOM and lids are normal. Pupils are equal, round, and reactive to light.  Neck: Normal range of motion. Neck supple. No tracheal deviation present. No thyroid mass present.  Cardiovascular: Normal rate, regular rhythm and  normal heart sounds.  Exam reveals no gallop.   No murmur heard. Pulmonary/Chest: Effort normal and breath sounds normal. No stridor. No respiratory distress. She has no decreased breath sounds. She has no wheezes. She has no rhonchi. She has no rales.  Abdominal: Soft. Normal appearance and bowel sounds are normal. She exhibits no distension. There is tenderness in the right lower quadrant. There is no rigidity, no rebound, no guarding and no CVA tenderness.    Musculoskeletal: Normal range of motion. She exhibits no edema or tenderness.  Neurological: She is alert and oriented to person, place, and time. She has normal strength. No cranial nerve deficit or sensory deficit. GCS eye subscore is 4. GCS verbal subscore is 5. GCS motor subscore is 6.  Skin: Skin is warm and dry. No abrasion and no rash noted.  Psychiatric: She has a normal mood and affect. Her speech is normal and behavior is normal.  Nursing note and vitals reviewed.    ED Treatments / Results  Labs (all labs ordered are listed, but only abnormal results are displayed) Labs Reviewed  CBC - Abnormal; Notable for the following:       Result Value   WBC 13.7 (*)    Platelets 430 (*)    All other components within normal limits  LIPASE, BLOOD  COMPREHENSIVE METABOLIC PANEL  URINALYSIS, ROUTINE W REFLEX MICROSCOPIC  I-STAT BETA HCG BLOOD, ED ([facility], [facility], [facility] ONLY)    EKG  EKG Interpretation None       Radiology No results found.  Procedures Procedures (including critical care time)  Medications Ordered in ED Medications  0.9 %  sodium chloride infusion (not administered)  morphine 4 MG/ML injection 4 mg (not administered)  LORazepam (ATIVAN) injection 0.5 mg (not administered)     Initial Impression / Assessment and Plan / ED Course  I have reviewed the triage vital signs and the nursing notes.  Pertinent labs & imaging results that were available during my care of the patient were reviewed by me and  considered in my medical decision making (see chart for details).    Patient has deferred her pelvic exam. Pelvic ultrasound and abdominal CT negative. Patient given pain medication. It better. Will place her muscle relaxants and pain meds and symptoms likely from her known history of fibroids. Patient took her home dose of labetalol. Final Clinical Impressions(s) / ED Diagnoses   Final diagnoses:  None    New Prescriptions New Prescriptions   No medications on file     Lacretia Leigh, MD 12/12/16 0017    Lacretia Leigh, MD 12/12/16 0019    Lacretia Leigh, MD 12/12/16 0023

## 2016-12-11 NOTE — ED Notes (Signed)
Patient transported to Ultrasound prior to my being able to medicate or evaluate will medicate upon return.

## 2016-12-12 LAB — URINALYSIS, ROUTINE W REFLEX MICROSCOPIC
Bilirubin Urine: NEGATIVE
GLUCOSE, UA: NEGATIVE mg/dL
Hgb urine dipstick: NEGATIVE
Ketones, ur: NEGATIVE mg/dL
LEUKOCYTES UA: NEGATIVE
Nitrite: NEGATIVE
PH: 5 (ref 5.0–8.0)
Protein, ur: NEGATIVE mg/dL
SPECIFIC GRAVITY, URINE: 1.013 (ref 1.005–1.030)

## 2016-12-12 MED ORDER — OXYCODONE-ACETAMINOPHEN 5-325 MG PO TABS: 2.0000 | ORAL_TABLET | ORAL | 0 refills | Status: DC | PRN

## 2016-12-12 MED ORDER — METHOCARBAMOL 750 MG PO TABS: 750.0000 mg | ORAL_TABLET | Freq: Four times a day (QID) | ORAL | 0 refills | Status: DC

## 2017-02-26 ENCOUNTER — Emergency Department (HOSPITAL_COMMUNITY)
Admission: EM | Admit: 2017-02-26 | Discharge: 2017-02-26 | Disposition: A | Discharge status: Home / Self Care | Payer: Managed Care, Other (non HMO) | Attending: Emergency Medicine | Admitting: Emergency Medicine

## 2017-02-26 ENCOUNTER — Emergency Department (HOSPITAL_COMMUNITY): Payer: Managed Care, Other (non HMO)

## 2017-02-26 ENCOUNTER — Encounter (HOSPITAL_COMMUNITY): Payer: Self-pay | Admitting: Emergency Medicine

## 2017-02-26 DIAGNOSIS — R51 Headache: Secondary | ICD-10-CM | POA: Diagnosis present

## 2017-02-26 DIAGNOSIS — Z5321 Procedure and treatment not carried out due to patient leaving prior to being seen by health care provider: Secondary | ICD-10-CM | POA: Insufficient documentation

## 2017-02-26 LAB — DIFFERENTIAL
BASOS ABS: 0.1 10*3/uL (ref 0.0–0.1)
BASOS PCT: 1 %
EOS ABS: 0.1 10*3/uL (ref 0.0–0.7)
Eosinophils Relative: 1 %
LYMPHS ABS: 3.5 10*3/uL (ref 0.7–4.0)
Lymphocytes Relative: 31 %
MONOS PCT: 7 %
Monocytes Absolute: 0.8 10*3/uL (ref 0.1–1.0)
NEUTROS PCT: 60 %
Neutro Abs: 7 10*3/uL (ref 1.7–7.7)

## 2017-02-26 LAB — I-STAT TROPONIN, ED: Troponin i, poc: 0.01 ng/mL (ref 0.00–0.08)

## 2017-02-26 LAB — I-STAT CHEM 8, ED
BUN: 27 mg/dL — AB (ref 6–20)
CALCIUM ION: 1.03 mmol/L — AB (ref 1.15–1.40)
CHLORIDE: 105 mmol/L (ref 101–111)
CREATININE: 1 mg/dL (ref 0.44–1.00)
GLUCOSE: 89 mg/dL (ref 65–99)
HCT: 39 % (ref 36.0–46.0)
Hemoglobin: 13.3 g/dL (ref 12.0–15.0)
Potassium: 4.1 mmol/L (ref 3.5–5.1)
Sodium: 138 mmol/L (ref 135–145)
TCO2: 28 mmol/L (ref 22–32)

## 2017-02-26 LAB — COMPREHENSIVE METABOLIC PANEL
ALBUMIN: 3.7 g/dL (ref 3.5–5.0)
ALT: 18 U/L (ref 14–54)
ANION GAP: 11 (ref 5–15)
AST: 18 U/L (ref 15–41)
Alkaline Phosphatase: 65 U/L (ref 38–126)
BILIRUBIN TOTAL: 0.4 mg/dL (ref 0.3–1.2)
BUN: 20 mg/dL (ref 6–20)
CALCIUM: 9.2 mg/dL (ref 8.9–10.3)
CO2: 21 mmol/L — AB (ref 22–32)
CREATININE: 1.05 mg/dL — AB (ref 0.44–1.00)
Chloride: 105 mmol/L (ref 101–111)
GFR calc Af Amer: >60 mL/min (ref >60)
Glucose, Bld: 82 mg/dL (ref 65–99)
Potassium: 3.9 mmol/L (ref 3.5–5.1)
Sodium: 137 mmol/L (ref 135–145)
TOTAL PROTEIN: 7.1 g/dL (ref 6.5–8.1)

## 2017-02-26 LAB — CBC
HCT: 37.6 % (ref 36.0–46.0)
Hemoglobin: 11.9 g/dL — ABNORMAL LOW (ref 12.0–15.0)
MCH: 26.2 pg (ref 26.0–34.0)
MCHC: 31.6 g/dL (ref 30.0–36.0)
MCV: 82.6 fL (ref 78.0–100.0)
Platelets: 388 10*3/uL (ref 150–400)
RBC: 4.55 MIL/uL (ref 3.87–5.11)
RDW: 15.2 % (ref 11.5–15.5)
WBC: 11.4 10*3/uL — ABNORMAL HIGH (ref 4.0–10.5)

## 2017-02-26 LAB — APTT: APTT: 30 s (ref 24–36)

## 2017-02-26 LAB — PROTIME-INR
INR: 1.02
Prothrombin Time: 13.4 seconds (ref 11.4–15.2)

## 2017-02-26 NOTE — ED Notes (Signed)
patient left - gave stickers to Registration clerk.

## 2017-02-26 NOTE — ED Triage Notes (Signed)
Pt reports sudden onset L sided HA at 1430 accompanied by blurred vision, followed by L upper lip numbness.  Pt reports blurred vision resolved at this time.  Pt reports BP 1430 220/112, reports taking losartan-HCTZ with a labetalol.  Pt evaluated by Dr. Thomasene Lot in triage, verbal orders placed.

## 2017-05-10 ENCOUNTER — Other Ambulatory Visit: Payer: Self-pay

## 2017-05-10 ENCOUNTER — Encounter (HOSPITAL_COMMUNITY): Payer: Self-pay

## 2017-05-10 ENCOUNTER — Emergency Department (HOSPITAL_COMMUNITY)
Admission: EM | Admit: 2017-05-10 | Discharge: 2017-05-10 | Disposition: A | Discharge status: Home / Self Care | Payer: Managed Care, Other (non HMO) | Attending: Emergency Medicine | Admitting: Emergency Medicine

## 2017-05-10 DIAGNOSIS — G932 Benign intracranial hypertension: Secondary | ICD-10-CM | POA: Insufficient documentation

## 2017-05-10 DIAGNOSIS — G43809 Other migraine, not intractable, without status migrainosus: Secondary | ICD-10-CM | POA: Insufficient documentation

## 2017-05-10 DIAGNOSIS — Z79899 Other long term (current) drug therapy: Secondary | ICD-10-CM | POA: Insufficient documentation

## 2017-05-10 DIAGNOSIS — I10 Essential (primary) hypertension: Secondary | ICD-10-CM | POA: Insufficient documentation

## 2017-05-10 DIAGNOSIS — R42 Dizziness and giddiness: Secondary | ICD-10-CM | POA: Diagnosis present

## 2017-05-10 LAB — BASIC METABOLIC PANEL
ANION GAP: 9 (ref 5–15)
BUN: 12 mg/dL (ref 6–20)
CALCIUM: 9.1 mg/dL (ref 8.9–10.3)
CO2: 24 mmol/L (ref 22–32)
Chloride: 102 mmol/L (ref 101–111)
Creatinine, Ser: 0.92 mg/dL (ref 0.44–1.00)
GLUCOSE: 99 mg/dL (ref 65–99)
Potassium: 3.7 mmol/L (ref 3.5–5.1)
SODIUM: 135 mmol/L (ref 135–145)

## 2017-05-10 LAB — CBC
HCT: 38.2 % (ref 36.0–46.0)
HEMOGLOBIN: 12.8 g/dL (ref 12.0–15.0)
MCH: 27.3 pg (ref 26.0–34.0)
MCHC: 33.5 g/dL (ref 30.0–36.0)
MCV: 81.4 fL (ref 78.0–100.0)
Platelets: 439 10*3/uL — ABNORMAL HIGH (ref 150–400)
RBC: 4.69 MIL/uL (ref 3.87–5.11)
RDW: 14.4 % (ref 11.5–15.5)
WBC: 8.6 10*3/uL (ref 4.0–10.5)

## 2017-05-10 LAB — I-STAT CHEM 8, ED
BUN: 19 mg/dL (ref 6–20)
CALCIUM ION: 1.01 mmol/L — AB (ref 1.15–1.40)
CREATININE: 0.9 mg/dL (ref 0.44–1.00)
Chloride: 105 mmol/L (ref 101–111)
Glucose, Bld: 85 mg/dL (ref 65–99)
HCT: 39 % (ref 36.0–46.0)
Hemoglobin: 13.3 g/dL (ref 12.0–15.0)
Potassium: 4.4 mmol/L (ref 3.5–5.1)
SODIUM: 138 mmol/L (ref 135–145)
TCO2: 26 mmol/L (ref 22–32)

## 2017-05-10 LAB — I-STAT BETA HCG BLOOD, ED (MC, WL, AP ONLY)

## 2017-05-10 LAB — I-STAT TROPONIN, ED: TROPONIN I, POC: 0.01 ng/mL (ref 0.00–0.08)

## 2017-05-10 MED ORDER — SODIUM CHLORIDE 0.9 % IV BOLUS (SEPSIS)
1000.0000 mL | Freq: Once | INTRAVENOUS | Status: AC
  Administered 2017-05-10: 1000 mL via INTRAVENOUS

## 2017-05-10 MED ORDER — DIPHENHYDRAMINE HCL 50 MG/ML IJ SOLN
25.0000 mg | Freq: Once | INTRAMUSCULAR | Status: AC
  Administered 2017-05-10: 25 mg via INTRAVENOUS
  Filled 2017-05-10: qty 1

## 2017-05-10 MED ORDER — METOCLOPRAMIDE HCL 5 MG/ML IJ SOLN
10.0000 mg | Freq: Once | INTRAMUSCULAR | Status: AC
  Administered 2017-05-10: 10 mg via INTRAVENOUS
  Filled 2017-05-10: qty 2

## 2017-05-10 MED ORDER — LABETALOL HCL 5 MG/ML IV SOLN
10.0000 mg | Freq: Once | INTRAVENOUS | Status: AC
  Administered 2017-05-10: 10 mg via INTRAVENOUS
  Filled 2017-05-10: qty 4

## 2017-05-10 NOTE — ED Triage Notes (Signed)
Patient complains of dizziness and headache x 2 days. States that her BP has been fluctuating. Just added lopressor and lorsatan to her BP regimen this past Monday. Complains of Headache with nausea. Alert and oriented, no neuro deficits

## 2017-05-10 NOTE — Discharge Instructions (Signed)
Take your blood pressure meds as prescribed.  See your neurologist.   Return to ER if you have worse headaches, blurry vision, weakness, numbness

## 2017-05-10 NOTE — ED Provider Notes (Signed)
[facility] EMERGENCY DEPARTMENT Provider Note   CSN: [id number] Arrival date & time: 05/10/17  0849     History   Chief Complaint Chief Complaint  Patient presents with  . dizziness/HTN    HPI Annette Deleon is a 41 y.o. female history of hypertension, migraines, pseudotumor cerebri here presenting with headaches, dizziness, hypertension.  Patient states that she has been having headaches for the last several days.  She states that the headache is frontal and is associated with elevated blood pressure.  Also associated with some blurry vision and dizziness as well.  Patient felt nauseated this morning but denies any vomiting or fevers or neck pain or stiffness.  Patient does have a history of pseudotumor cerebri that was diagnosed previously but did not tolerate Diamox and has been feeling better after she was started on hydrochlorothiazide.  Of note, patient is on multiple blood pressure medicines and metoprolol was added recently due to uncontrolled blood pressure.  She had normal CT head 2 months ago.   The history is provided by the patient.    Past Medical History:  Diagnosis Date  . Hypertension   . Migraines   . Obesity   . Pseudotumor cerebri   . Sleep apnea     Patient Active Problem List   Diagnosis Date Noted  . Near syncope 09/08/2014  . Complicated migraine 66/44/0347  . HTN (hypertension) 11/20/2011  . Pseudotumor cerebri 11/20/2011  . Obesity 11/20/2011    Past Surgical History:  Procedure Laterality Date  . ARTHROSCOPIC REPAIR ACL    . CESAREAN SECTION    . CHOLECYSTECTOMY    . KNEE SURGERY     right  . MYOMECTOMY      OB History    No data available       Home Medications    Prior to Admission medications   Medication Sig Start Date End Date Taking? Authorizing Provider  albuterol (PROVENTIL HFA;VENTOLIN HFA) 108 (90 Base) MCG/ACT inhaler Inhale 1 puff into the lungs every 6 (six) hours as needed for wheezing or shortness  of breath.  11/19/16   [provider]  HYDROcodone-acetaminophen (NORCO/VICODIN) 5-325 MG tablet Take 1 tablet by mouth every 4 (four) hours as needed. Patient not taking: Reported on 11/13/2016 10/13/16   Lily Kocher, PA-C  labetalol (NORMODYNE) 200 MG tablet Take 200 mg by mouth daily as needed (high blood pressure). As needed for blood pressure     [provider]  losartan-hydrochlorothiazide (HYZAAR) 100-25 MG tablet Take 1 tablet by mouth daily.    [provider]  methocarbamol (ROBAXIN-750) 750 MG tablet Take 1 tablet (750 mg total) by mouth 4 (four) times daily. 12/12/16   Lacretia Leigh, MD  ondansetron (ZOFRAN ODT) 4 MG disintegrating tablet Take 1 tablet (4 mg total) by mouth every 8 (eight) hours as needed for nausea or vomiting. Patient not taking: Reported on 11/13/2016 07/27/16   Leo Grosser, MD  ondansetron (ZOFRAN) 4 MG tablet Take 1 tablet (4 mg total) by mouth every 6 (six) hours as needed for nausea or vomiting. Patient not taking: Reported on 09/25/9561 8/75/64   Delora Fuel, MD  oxyCODONE-acetaminophen (PERCOCET) 5-325 MG tablet Take 1 tablet by mouth every 4 (four) hours as needed for moderate pain. Patient not taking: Reported on 3/32/9518 8/41/66   Delora Fuel, MD  oxyCODONE-acetaminophen (PERCOCET/ROXICET) 5-325 MG tablet Take 2 tablets by mouth every 4 (four) hours as needed for severe pain. 12/12/16   Lacretia Leigh, MD  pantoprazole (PROTONIX) 20 MG tablet Take 1 tablet (20 mg total) by mouth daily. Patient not taking: Reported on 11/13/2016 10/05/16   Charlesetta Shanks, MD    Family History No family history on file.  Social History Social History   Tobacco Use  . Smoking status: Never Smoker  . Smokeless tobacco: Never Used  Substance Use Topics  . Alcohol use: Yes    Comment: occasionally  . Drug use: No     Allergies   Imitrex [sumatriptan base]; Prednisone; Wellbutrin [bupropion]; Yellow jacket venom [bee venom]; Ibuprofen;  and Penicillins   Review of Systems Review of Systems  Neurological: Positive for dizziness and headaches.  All other systems reviewed and are negative.    Physical Exam Updated Vital Signs BP (!) 156/81 (BP Location: Left Arm)   Pulse 90   Temp 98.6 F (37 C) (Oral)   Resp 20   Ht 5\' 3"  (1.6 m)   Wt 125.2 kg (276 lb)   SpO2 100%   BMI 48.89 kg/m   Physical Exam  Constitutional: She is oriented to person, place, and time.  Uncomfortable, some photophobia   HENT:  Head: Normocephalic.  Mouth/Throat: Oropharynx is clear and moist.  Eyes: EOM are normal. Pupils are equal, round, and reactive to light.  No nystagmus   Neck: Normal range of motion. Neck supple.  Cardiovascular: Normal rate, regular rhythm and normal heart sounds.  Pulmonary/Chest: Effort normal and breath sounds normal. No stridor. No respiratory distress. She has no wheezes.  Abdominal: Soft. Bowel sounds are normal. She exhibits no distension. There is no tenderness.  Musculoskeletal: Normal range of motion. She exhibits no edema.  Neurological: She is alert and oriented to person, place, and time. No cranial nerve deficit. Coordination normal.  CN 2-12 intact, nl strength and sensation throughout   Skin: Skin is warm.  Psychiatric: She has a normal mood and affect.  Nursing note and vitals reviewed.    ED Treatments / Results  Labs (all labs ordered are listed, but only abnormal results are displayed) Labs Reviewed  CBC - Abnormal; Notable for the following components:      Result Value   Platelets 439 (*)    All other components within normal limits  I-STAT CHEM 8, ED - Abnormal; Notable for the following components:   Calcium, Ion 1.01 (*)    All other components within normal limits  BASIC METABOLIC PANEL  I-STAT BETA HCG BLOOD, ED ([facility], [facility], [facility] ONLY)  I-STAT TROPONIN, ED    EKG  EKG Interpretation  Date/Time:  Saturday May 10 2017 09:15:04 EST Ventricular Rate:  72 PR  Interval:  144 QRS Duration: 86 QT Interval:  394 QTC Calculation: 431 R Axis:   -6 Text Interpretation:  Normal sinus rhythm Nonspecific ST and T wave abnormality Abnormal ECG No significant change since last tracing Confirmed by Wandra Arthurs [phone]) on 05/10/2017 9:27:34 AM       Radiology No results found.  Procedures Procedures (including critical care time)  Medications Ordered in ED Medications  metoCLOPramide (REGLAN) injection 10 mg (10 mg Intravenous Given 05/10/17 1125)  diphenhydrAMINE (BENADRYL) injection 25 mg (25 mg Intravenous Given 05/10/17 1125)  labetalol (NORMODYNE,TRANDATE) injection 10 mg (10 mg Intravenous Given 05/10/17 1126)  sodium chloride 0.9 % bolus 1,000 mL (1,000 mLs Intravenous New Bag/Given 05/10/17 1125)     Initial Impression / Assessment and Plan / ED Course  I have reviewed the triage vital signs and the nursing notes.  Pertinent labs &  imaging results that were available during my care of the patient were reviewed by me and considered in my medical decision making (see chart for details).    Annette Deleon is a 41 y.o. female here with headaches, dizziness, hypertension. Likely migraine vs symptomatic hypertension. Nl neuro exam currently. Will get labs, migraine cocktail. Will give BP meds.   12:58 PM Patient's labs unremarkable. BP and headache improved. Has neurology follow up. Will dc home.    Final Clinical Impressions(s) / ED Diagnoses   Final diagnoses:  None    ED Discharge Orders    None       Drenda Freeze, MD 05/10/17 1259

## 2018-06-12 ENCOUNTER — Observation Stay (HOSPITAL_COMMUNITY)
Admission: EM | Admit: 2018-06-12 | Discharge: 2018-06-13 | Disposition: A | Discharge status: Home / Self Care | Payer: Managed Care, Other (non HMO) | Attending: Internal Medicine | Admitting: Internal Medicine

## 2018-06-12 ENCOUNTER — Encounter (HOSPITAL_COMMUNITY): Payer: Self-pay | Admitting: Emergency Medicine

## 2018-06-12 ENCOUNTER — Other Ambulatory Visit: Payer: Self-pay

## 2018-06-12 ENCOUNTER — Emergency Department (HOSPITAL_COMMUNITY): Payer: Managed Care, Other (non HMO)

## 2018-06-12 DIAGNOSIS — E669 Obesity, unspecified: Secondary | ICD-10-CM | POA: Insufficient documentation

## 2018-06-12 DIAGNOSIS — Z79899 Other long term (current) drug therapy: Secondary | ICD-10-CM | POA: Diagnosis not present

## 2018-06-12 DIAGNOSIS — G4730 Sleep apnea, unspecified: Secondary | ICD-10-CM | POA: Insufficient documentation

## 2018-06-12 DIAGNOSIS — R079 Chest pain, unspecified: Secondary | ICD-10-CM | POA: Diagnosis present

## 2018-06-12 DIAGNOSIS — I700 Atherosclerosis of aorta: Secondary | ICD-10-CM

## 2018-06-12 DIAGNOSIS — I10 Essential (primary) hypertension: Secondary | ICD-10-CM | POA: Insufficient documentation

## 2018-06-12 DIAGNOSIS — R0789 Other chest pain: Principal | ICD-10-CM | POA: Insufficient documentation

## 2018-06-12 DIAGNOSIS — E7800 Pure hypercholesterolemia, unspecified: Secondary | ICD-10-CM

## 2018-06-12 DIAGNOSIS — Z6841 Body Mass Index (BMI) 40.0 and over, adult: Secondary | ICD-10-CM | POA: Diagnosis not present

## 2018-06-12 DIAGNOSIS — R0602 Shortness of breath: Secondary | ICD-10-CM | POA: Diagnosis present

## 2018-06-12 DIAGNOSIS — E785 Hyperlipidemia, unspecified: Secondary | ICD-10-CM | POA: Insufficient documentation

## 2018-06-12 DIAGNOSIS — R7989 Other specified abnormal findings of blood chemistry: Secondary | ICD-10-CM

## 2018-06-12 LAB — BASIC METABOLIC PANEL
ANION GAP: 11 (ref 5–15)
BUN: 23 mg/dL — ABNORMAL HIGH (ref 6–20)
CALCIUM: 9.6 mg/dL (ref 8.9–10.3)
CO2: 22 mmol/L (ref 22–32)
Chloride: 104 mmol/L (ref 98–111)
Creatinine, Ser: 1.19 mg/dL — ABNORMAL HIGH (ref 0.44–1.00)
GFR calc Af Amer: >60 mL/min (ref >60)
GFR calc non Af Amer: 55 mL/min — ABNORMAL LOW (ref >60)
Glucose, Bld: 172 mg/dL — ABNORMAL HIGH (ref 70–99)
Potassium: 3.5 mmol/L (ref 3.5–5.1)
Sodium: 137 mmol/L (ref 135–145)

## 2018-06-12 LAB — CBC
HCT: 42.4 % (ref 36.0–46.0)
Hemoglobin: 13.9 g/dL (ref 12.0–15.0)
MCH: 28.1 pg (ref 26.0–34.0)
MCHC: 32.8 g/dL (ref 30.0–36.0)
MCV: 85.7 fL (ref 80.0–100.0)
NRBC: 0 % (ref 0.0–0.2)
PLATELETS: 530 10*3/uL — AB (ref 150–400)
RBC: 4.95 MIL/uL (ref 3.87–5.11)
RDW: 13.8 % (ref 11.5–15.5)
WBC: 14.2 10*3/uL — ABNORMAL HIGH (ref 4.0–10.5)

## 2018-06-12 LAB — I-STAT TROPONIN, ED
TROPONIN I, POC: 0 ng/mL (ref 0.00–0.08)
TROPONIN I, POC: 0 ng/mL (ref 0.00–0.08)

## 2018-06-12 LAB — I-STAT BETA HCG BLOOD, ED (MC, WL, AP ONLY)

## 2018-06-12 MED ORDER — ONDANSETRON HCL 4 MG/2ML IJ SOLN
4.0000 mg | Freq: Once | INTRAMUSCULAR | Status: AC
  Administered 2018-06-12: 4 mg via INTRAVENOUS
  Filled 2018-06-12: qty 2

## 2018-06-12 MED ORDER — ASPIRIN 81 MG PO CHEW
324.0000 mg | CHEWABLE_TABLET | Freq: Once | ORAL | Status: AC
  Administered 2018-06-12: 324 mg via ORAL
  Filled 2018-06-12: qty 4

## 2018-06-12 MED ORDER — IOPAMIDOL (ISOVUE-370) INJECTION 76%
INTRAVENOUS | Status: AC
  Filled 2018-06-12: qty 100

## 2018-06-12 MED ORDER — SODIUM CHLORIDE 0.9 % IV BOLUS
1000.0000 mL | Freq: Once | INTRAVENOUS | Status: AC
  Administered 2018-06-12: 1000 mL via INTRAVENOUS

## 2018-06-12 MED ORDER — SODIUM CHLORIDE (PF) 0.9 % IJ SOLN
INTRAMUSCULAR | Status: AC
  Filled 2018-06-12: qty 50

## 2018-06-12 MED ORDER — IOPAMIDOL (ISOVUE-370) INJECTION 76%
100.0000 mL | Freq: Once | INTRAVENOUS | Status: AC | PRN
  Administered 2018-06-12: 100 mL via INTRAVENOUS

## 2018-06-12 MED ORDER — MORPHINE SULFATE (PF) 4 MG/ML IV SOLN
4.0000 mg | Freq: Once | INTRAVENOUS | Status: AC
  Administered 2018-06-12: 4 mg via INTRAVENOUS
  Filled 2018-06-12: qty 1

## 2018-06-12 MED ORDER — NITROGLYCERIN 0.4 MG SL SUBL
0.4000 mg | SUBLINGUAL_TABLET | SUBLINGUAL | Status: DC | PRN
  Administered 2018-06-13: 0.4 mg via SUBLINGUAL
  Filled 2018-06-12: qty 1

## 2018-06-12 NOTE — ED Notes (Signed)
Patient transported to CT 

## 2018-06-12 NOTE — ED Provider Notes (Signed)
[facility] DEPT Provider Note   CSN: [id number] Arrival date & time: 06/12/18  1814     History   Chief Complaint Chief Complaint  Patient presents with  . Chest Pain    HPI Annette Deleon is a 43 y.o. female.  HPI  Patient is a 43 year old female with a history of hypertension, obesity, enteropathic intracranial hypertension presenting for chest pain.  Patient reports that her symptoms began approximately 1 hour prior to arrival.  She reports that she was eating dinner with her mother at [facility] when she began having chest tightness, diaphoresis, and feeling that she could not get in a deep breath.  She reports that the pain was initially between her shoulder blades but migrated to her anterior chest where it is currently.  Patient reports that she has had episodes of palpitations during this time.  She reports that she has an app on her phone that calculated her heart rate and it was in the 170s.  No syncope/presyncope. Describes pain is 6 out of 10 intermittently sharp.  Patient denies any recent illnesses, fevers, chills, cough.  Patient denies any recent immobilization, hospitalization, hormone use, cancer treatment, history DVT/PE, recent surgery, or high risk family history of DVT/PE.  Patient does report that she has a family history of early MI in her father at age 57.  Patient denies ever smoking.  Reports occasional alcohol use.  Past Medical History:  Diagnosis Date  . Hypertension   . Migraines   . Obesity   . Pseudotumor cerebri   . Sleep apnea     Patient Active Problem List   Diagnosis Date Noted  . Near syncope 09/08/2014  . Complicated migraine 78/29/5621  . HTN (hypertension) 11/20/2011  . Pseudotumor cerebri 11/20/2011  . Obesity 11/20/2011    Past Surgical History:  Procedure Laterality Date  . ARTHROSCOPIC REPAIR ACL    . CESAREAN SECTION    . CHOLECYSTECTOMY    . KNEE SURGERY     right  . MYOMECTOMY       OB  History   No obstetric history on file.      Home Medications    Prior to Admission medications   Medication Sig Start Date End Date Taking? Authorizing Provider  acetaminophen (TYLENOL) 500 MG tablet Take 500-1,000 mg by mouth every 6 (six) hours as needed for mild pain.   Yes [provider]  albuterol (PROVENTIL HFA;VENTOLIN HFA) 108 (90 Base) MCG/ACT inhaler Inhale 1 puff into the lungs every 6 (six) hours as needed for wheezing or shortness of breath.  11/19/16  Yes [provider]  diclofenac sodium (VOLTAREN) 1 % GEL Apply 2 g topically daily as needed (pain).   Yes [provider]  hydrochlorothiazide (HYDRODIURIL) 25 MG tablet Take 25 mg by mouth daily.   Yes [provider]  hydrochlorothiazide (HYDRODIURIL) 50 MG tablet Take 50 mg by mouth daily.   Yes [provider]  Liraglutide -Weight Management (SAXENDA) 18 MG/3ML SOPN Inject 3 mLs into the skin daily.   Yes [provider]  losartan (COZAAR) 100 MG tablet Take 100 mg by mouth daily.   Yes [provider]  metoprolol succinate (TOPROL-XL) 25 MG 24 hr tablet Take 25 mg by mouth daily.   Yes [provider]  HYDROcodone-acetaminophen (NORCO/VICODIN) 5-325 MG tablet Take 1 tablet by mouth every 4 (four) hours as needed. Patient not taking: Reported on 11/13/2016 10/13/16   Lily Kocher, PA-C  methocarbamol [phone])  750 MG tablet Take 1 tablet (750 mg total) by mouth 4 (four) times daily. Patient not taking: Reported on 06/12/2018 12/12/16   Lacretia Leigh, MD  ondansetron (ZOFRAN ODT) 4 MG disintegrating tablet Take 1 tablet (4 mg total) by mouth every 8 (eight) hours as needed for nausea or vomiting. Patient not taking: Reported on 11/13/2016 07/27/16   Leo Grosser, MD  ondansetron (ZOFRAN) 4 MG tablet Take 1 tablet (4 mg total) by mouth every 6 (six) hours as needed for nausea or vomiting. Patient not taking: Reported on 9/98/3382 10/05/37   Delora Fuel,  MD  oxyCODONE-acetaminophen (PERCOCET) 5-325 MG tablet Take 1 tablet by mouth every 4 (four) hours as needed for moderate pain. Patient not taking: Reported on 7/67/3419 3/79/02   Delora Fuel, MD  oxyCODONE-acetaminophen (PERCOCET/ROXICET) 5-325 MG tablet Take 2 tablets by mouth every 4 (four) hours as needed for severe pain. Patient not taking: Reported on 06/12/2018 12/12/16   Lacretia Leigh, MD  pantoprazole (PROTONIX) 20 MG tablet Take 1 tablet (20 mg total) by mouth daily. Patient not taking: Reported on 11/13/2016 10/05/16   Charlesetta Shanks, MD    Family History History reviewed. No pertinent family history.  Social History Social History   Tobacco Use  . Smoking status: Never Smoker  . Smokeless tobacco: Never Used  Substance Use Topics  . Alcohol use: Yes    Comment: occasionally  . Drug use: No     Allergies   Imitrex [sumatriptan base]; Prednisone; Wellbutrin [bupropion]; Yellow jacket venom [bee venom]; Ibuprofen; and Penicillins   Review of Systems Review of Systems  Constitutional: Negative for chills and fever.  HENT: Negative for congestion and sore throat.   Eyes: Negative for visual disturbance.  Respiratory: Positive for chest tightness and shortness of breath. Negative for cough.   Cardiovascular: Positive for chest pain. Negative for palpitations and leg swelling.  Gastrointestinal: Negative for abdominal pain, nausea and vomiting.  Genitourinary: Negative for dysuria and flank pain.  Musculoskeletal: Negative for back pain and myalgias.  Skin: Negative for rash.  Neurological: Negative for dizziness, syncope, light-headedness and headaches.     Physical Exam Updated Vital Signs BP 131/88   Pulse 99   Temp 98.1 F (36.7 C) (Oral)   Resp 12   Ht 5\' 4"  (1.626 m)   Wt 115.2 kg   LMP 06/03/2018 (Approximate)   SpO2 97%   BMI 43.60 kg/m   Physical Exam Vitals signs and nursing note reviewed.  Constitutional:      General: She is not in acute  distress.    Appearance: She is well-developed.  HENT:     Head: Normocephalic and atraumatic.  Eyes:     Conjunctiva/sclera: Conjunctivae normal.     Pupils: Pupils are equal, round, and reactive to light.  Neck:     Musculoskeletal: Normal range of motion and neck supple.  Cardiovascular:     Rate and Rhythm: Regular rhythm. Tachycardia present.     Pulses:          Radial pulses are 2+ on the right side and 2+ on the left side.       Dorsalis pedis pulses are 2+ on the right side and 2+ on the left side.     Heart sounds: S1 normal and S2 normal. No murmur.     Comments: No lower extremity edema.  No calf tenderness. Pulmonary:     Effort: Pulmonary effort is normal.     Breath sounds: Normal breath sounds. No wheezing  or rales.  Abdominal:     General: There is no distension.     Palpations: Abdomen is soft.     Tenderness: There is no abdominal tenderness. There is no guarding.  Musculoskeletal: Normal range of motion.        General: No deformity.  Lymphadenopathy:     Cervical: No cervical adenopathy.  Skin:    General: Skin is warm and dry.     Findings: No erythema or rash.  Neurological:     Mental Status: She is alert.     Comments: Cranial nerves grossly intact. Patient moves extremities symmetrically and with good coordination.  Psychiatric:        Behavior: Behavior normal.        Thought Content: Thought content normal.        Judgment: Judgment normal.      ED Treatments / Results  Labs (all labs ordered are listed, but only abnormal results are displayed) Labs Reviewed  BASIC METABOLIC PANEL - Abnormal; Notable for the following components:      Result Value   Glucose, Bld 172 (*)    BUN 23 (*)    Creatinine, Ser 1.19 (*)    GFR calc non Af Amer 55 (*)    All other components within normal limits  CBC - Abnormal; Notable for the following components:   WBC 14.2 (*)    Platelets 530 (*)    All other components within normal limits  I-STAT  TROPONIN, ED  I-STAT BETA HCG BLOOD, ED ([facility], [facility], [facility] ONLY)  I-STAT TROPONIN, ED    EKG EKG Interpretation  Date/Time:  Friday June 12 2018 19:19:18 EST Ventricular Rate:  113 PR Interval:    QRS Duration: 104 QT Interval:  334 QTC Calculation: 458 R Axis:   -11 Text Interpretation:  Sinus tachycardia Minimal ST depression, lateral leads When compared with ECG of 02/26/2017 Nonspecific ST abnormality Lateral leads is now Present Confirmed by Francine Graven [phone]) on 06/12/2018 7:26:20 PM   Radiology Dg Chest 2 View  Result Date: 06/12/2018 CLINICAL DATA:  Chest pain EXAM: CHEST - 2 VIEW COMPARISON:  10/05/2016 FINDINGS: The heart size and mediastinal contours are within normal limits. Both lungs are clear. The visualized skeletal structures are unremarkable. IMPRESSION: No active cardiopulmonary disease. Electronically Signed   By: Donavan Foil M.D.   On: 06/12/2018 20:20   Ct Angio Chest Pe W/cm &/or Wo Cm  Result Date: 06/12/2018 CLINICAL DATA:  Angina and dyspnea EXAM: CT ANGIOGRAPHY CHEST WITH CONTRAST TECHNIQUE: Multidetector CT imaging of the chest was performed using the standard protocol during bolus administration of intravenous contrast. Multiplanar CT image reconstructions and MIPs were obtained to evaluate the vascular anatomy. CONTRAST:  175mL ISOVUE-370 IOPAMIDOL (ISOVUE-370) INJECTION 76% COMPARISON:  Radiograph 06/12/2018 FINDINGS: Cardiovascular: Satisfactory opacification of the pulmonary arteries to the segmental level. No evidence of pulmonary embolism. Normal heart size. No pericardial effusion. Nonaneurysmal aorta. No dissection seen. Mild aortic atherosclerosis. Mediastinum/Nodes: No enlarged mediastinal, hilar, or axillary lymph nodes. Thyroid gland, trachea, and esophagus demonstrate no significant findings. Lungs/Pleura: Lungs are clear. No pleural effusion or pneumothorax. Upper Abdomen: No acute abnormality. Musculoskeletal: No chest wall abnormality. No acute  or significant osseous findings. Review of the MIP images confirms the above findings. IMPRESSION: Negative. No CT evidence for acute pulmonary embolus or aortic dissection. Clear lung fields. Aortic Atherosclerosis (ICD10-I70.0). Electronically Signed   By: Donavan Foil M.D.   On: 06/12/2018 21:17    Procedures Procedures (including critical  care time)  Medications Ordered in ED Medications  sodium chloride (PF) 0.9 % injection (has no administration in time range)  iopamidol (ISOVUE-370) 76 % injection (has no administration in time range)  sodium chloride 0.9 % bolus 1,000 mL ( Intravenous Stopped 06/12/18 2132)  aspirin chewable tablet 324 mg (324 mg Oral Given 06/12/18 2027)  morphine 4 MG/ML injection 4 mg (4 mg Intravenous Given 06/12/18 2044)  iopamidol (ISOVUE-370) 76 % injection 100 mL (100 mLs Intravenous Contrast Given 06/12/18 2100)  ondansetron (ZOFRAN) injection 4 mg (4 mg Intravenous Given 06/12/18 2128)     Initial Impression / Assessment and Plan / ED Course  I have reviewed the triage vital signs and the nursing notes.  Pertinent labs & imaging results that were available during my care of the patient were reviewed by me and considered in my medical decision making (see chart for details).  Clinical Course as of Jun 12 2344  Fri Jun 12, 2018  2012 Has been elevated in the past.   Platelets(!): 530 [AM]  2037 CT Angio Chest PE W/Cm &/Or Wo Cm [AM]  2303 Case discussed with cardiology fellow, Dr. Ulysees Barns. States that overnight obs would be reasonable.  Patient has had a family history, and did have some EKG changes.  Appreciate his involvement in the care of this patient.   [AM]  2306 Reassessed. CP 4/10.    [AM]  2345 Patient has some dynamic EKG changes, questioning lead placement, but they did seem isolated the inferior leads.  Discussed again with Dr. Ulysees Barns.  States that patient does not need to go to [facility], however would continue ops cycling troponin, and  attempt nitro.   [AM]    Clinical Course User Index [AM] Albesa Seen, PA-C    Differential diagnosis includes ACS, PE, thoracic aortic dissection, Boerhaave's syndrome, cardiac tamponade, pneumothorax, incarcerated diaphragmatic hernia, cholecystitis, esophageal spasm, gastroesophageal reflux, herpes zoster of the thorax, pericarditis, pneumonia, chest wall pain, costochondritis.   Patient has had 2- troponins, however has had some EKG changes that was discussed with cardiology.  See above in ED course.  Atypical story, but patient does have a heart score of 4 (EKG changes, risk factors).  Doubt TAD by hx, CXR showed no widening mediastinum, and pulses equal in all extremities. CTA shows no dissection. Patient remained nontoxic appearing and in no acute distress during emergency department course. Vital signs initially tachycardic in the emergency department. Therefore, doubt esophageal rupture, cardiac tamponade, or pneumothorax. Pericarditis less likely due to no preceding infectious symptoms and pain not improved in upright positions. Abnormal labs include elevated creatinine, leukocytosis.    Patient did have slight leukocytosis and tachycardia.  Do not suspect sepsis.  Patient has not had a fever.  She has no localizing infectious symptoms.  Chest x-ray and CTPA do not demonstrate pneumonia.  Patient did have a steroid injection in her knee earlier today, however knee is not swollen and not erythematous and nonpainful.  Case discussed with cardiologist, Dr. Ulysees Barns, who suggested overnight observation. Feels that patient does not need transfer to [facility] for EKG changes.   Case discussed with Dr. Alcario Drought who will admit.   Patient and family understand and are in agreement with plan of care.  Final Clinical Impressions(s) / ED Diagnoses   Final diagnoses:  Central chest pain  Aortic atherosclerosis (HCC)  Elevated serum creatinine    ED Discharge Orders    None       Langston Masker B, PA-C  06/13/18 [place]ie[place], [place], DO 06/14/18 1652

## 2018-06-12 NOTE — ED Triage Notes (Signed)
Endorses chest pain which began this afternoon around 1.5 hours PTA.  States she had a cortisone shot this morning and thought that this was related.  She does state she had mid back pain and pain which she thought was heartburn around 1630 tonight.  C/o shortness of breath.  Patient noted to be diaphoretic in triage.

## 2018-06-13 ENCOUNTER — Encounter (HOSPITAL_COMMUNITY): Payer: Self-pay | Admitting: Internal Medicine

## 2018-06-13 ENCOUNTER — Ambulatory Visit (HOSPITAL_COMMUNITY)
Admission: RE | Admit: 2018-06-13 | Discharge: 2018-06-13 | Disposition: A | Discharge status: Home / Self Care | Payer: Managed Care, Other (non HMO) | Source: Ambulatory Visit | Attending: Cardiovascular Disease | Admitting: Cardiovascular Disease

## 2018-06-13 DIAGNOSIS — R079 Chest pain, unspecified: Secondary | ICD-10-CM

## 2018-06-13 DIAGNOSIS — E7800 Pure hypercholesterolemia, unspecified: Secondary | ICD-10-CM

## 2018-06-13 DIAGNOSIS — I700 Atherosclerosis of aorta: Secondary | ICD-10-CM | POA: Diagnosis not present

## 2018-06-13 DIAGNOSIS — R7989 Other specified abnormal findings of blood chemistry: Secondary | ICD-10-CM | POA: Diagnosis not present

## 2018-06-13 DIAGNOSIS — Z6841 Body Mass Index (BMI) 40.0 and over, adult: Secondary | ICD-10-CM

## 2018-06-13 DIAGNOSIS — I10 Essential (primary) hypertension: Secondary | ICD-10-CM

## 2018-06-13 DIAGNOSIS — I510 Cardiac septal defect, acquired: Secondary | ICD-10-CM | POA: Insufficient documentation

## 2018-06-13 LAB — NM MYOCAR MULTI W/SPECT W/WALL MOTION / EF
Estimated workload: 1 METS
Exercise duration (min): 4 min
Exercise duration (sec): 55 s
LV dias vol: 126 mL (ref 46–106)
LV sys vol: 49 mL
MPHR: 176 {beats}/min
Peak HR: 109 {beats}/min
Percent HR: 61 %
Rest HR: 69 {beats}/min

## 2018-06-13 LAB — LIPID PANEL
Cholesterol: 232 mg/dL — ABNORMAL HIGH (ref 0–200)
HDL: 46 mg/dL (ref >40)
LDL CALC: 177 mg/dL — AB (ref 0–99)
Total CHOL/HDL Ratio: 5 RATIO
Triglycerides: 46 mg/dL (ref <150)
VLDL: 9 mg/dL (ref 0–40)

## 2018-06-13 LAB — HIV ANTIBODY (ROUTINE TESTING W REFLEX): HIV Screen 4th Generation wRfx: NONREACTIVE

## 2018-06-13 LAB — TROPONIN I
Troponin I: 0.03 ng/mL (ref <0.03)
Troponin I: 0.05 ng/mL (ref <0.03)

## 2018-06-13 LAB — TSH: TSH: 0.522 u[IU]/mL (ref 0.350–4.500)

## 2018-06-13 MED ORDER — ATORVASTATIN CALCIUM 40 MG PO TABS: 40.0000 mg | ORAL_TABLET | Freq: Every day | ORAL | 0 refills | Status: AC

## 2018-06-13 MED ORDER — TECHNETIUM TC 99M TETROFOSMIN IV KIT
10.0000 | PACK | Freq: Once | INTRAVENOUS | Status: AC | PRN
  Administered 2018-06-13: 10 via INTRAVENOUS

## 2018-06-13 MED ORDER — LIRAGLUTIDE -WEIGHT MANAGEMENT 18 MG/3ML ~~LOC~~ SOPN: 3.0000 mL | PEN_INJECTOR | Freq: Every day | SUBCUTANEOUS | Status: DC

## 2018-06-13 MED ORDER — LOSARTAN POTASSIUM 50 MG PO TABS
100.0000 mg | ORAL_TABLET | Freq: Every day | ORAL | Status: DC
  Administered 2018-06-13: 100 mg via ORAL
  Filled 2018-06-13: qty 2

## 2018-06-13 MED ORDER — ACETAMINOPHEN 500 MG PO TABS: 500.0000 mg | ORAL_TABLET | Freq: Four times a day (QID) | ORAL | Status: DC | PRN

## 2018-06-13 MED ORDER — LOSARTAN POTASSIUM 50 MG PO TABS: 100.0000 mg | ORAL_TABLET | Freq: Every day | ORAL | Status: DC

## 2018-06-13 MED ORDER — ONDANSETRON HCL 4 MG/2ML IJ SOLN: 4.0000 mg | Freq: Four times a day (QID) | INTRAMUSCULAR | Status: DC | PRN

## 2018-06-13 MED ORDER — ENOXAPARIN SODIUM 40 MG/0.4ML ~~LOC~~ SOLN
40.0000 mg | SUBCUTANEOUS | Status: DC
  Filled 2018-06-13: qty 0.4

## 2018-06-13 MED ORDER — TECHNETIUM TC 99M TETROFOSMIN IV KIT
30.0000 | PACK | Freq: Once | INTRAVENOUS | Status: AC | PRN
  Administered 2018-06-13: 30 via INTRAVENOUS

## 2018-06-13 MED ORDER — ATORVASTATIN CALCIUM 40 MG PO TABS: 40.0000 mg | ORAL_TABLET | Freq: Every day | ORAL | Status: DC

## 2018-06-13 MED ORDER — ENOXAPARIN SODIUM 40 MG/0.4ML ~~LOC~~ SOLN
40.0000 mg | SUBCUTANEOUS | Status: DC
  Administered 2018-06-13: 40 mg via SUBCUTANEOUS
  Filled 2018-06-13 (×2): qty 0.4

## 2018-06-13 MED ORDER — REGADENOSON 0.4 MG/5ML IV SOLN
0.4000 mg | Freq: Once | INTRAVENOUS | Status: AC
  Administered 2018-06-13: 0.4 mg via INTRAVENOUS
  Filled 2018-06-13: qty 5

## 2018-06-13 MED ORDER — ALBUTEROL SULFATE (2.5 MG/3ML) 0.083% IN NEBU: 2.5000 mg | INHALATION_SOLUTION | Freq: Four times a day (QID) | RESPIRATORY_TRACT | Status: DC | PRN

## 2018-06-13 MED ORDER — HYDROCHLOROTHIAZIDE 25 MG PO TABS
25.0000 mg | ORAL_TABLET | Freq: Every day | ORAL | Status: DC
  Administered 2018-06-13: 25 mg via ORAL
  Filled 2018-06-13: qty 1

## 2018-06-13 MED ORDER — ENOXAPARIN SODIUM 40 MG/0.4ML ~~LOC~~ SOLN: 40.0000 mg | SUBCUTANEOUS | Status: DC

## 2018-06-13 MED ORDER — METOPROLOL SUCCINATE ER 25 MG PO TB24
25.0000 mg | ORAL_TABLET | Freq: Every day | ORAL | Status: DC
  Administered 2018-06-13: 25 mg via ORAL
  Filled 2018-06-13: qty 1

## 2018-06-13 MED ORDER — REGADENOSON 0.4 MG/5ML IV SOLN
INTRAVENOUS | Status: AC
  Filled 2018-06-13: qty 5

## 2018-06-13 MED ORDER — ACETAMINOPHEN 325 MG PO TABS: 650.0000 mg | ORAL_TABLET | ORAL | Status: DC | PRN

## 2018-06-13 MED ORDER — ALBUTEROL SULFATE HFA 108 (90 BASE) MCG/ACT IN AERS: 1.0000 | INHALATION_SPRAY | Freq: Four times a day (QID) | RESPIRATORY_TRACT | Status: DC | PRN

## 2018-06-13 NOTE — Discharge Instructions (Signed)

## 2018-06-13 NOTE — H&P (Signed)
History and Physical    Annette Deleon JKD:[id number] DOB: December 30, 1975 DOA: 06/12/2018  PCP: Gregor Hams, FNP  Patient coming from: Home  I have personally briefly reviewed patient's old medical records in [place]  Chief Complaint: CP  HPI: Annette Deleon is a 43 y.o. female with medical history significant of HTN, obesity.  FHx significant for early CAD with MI in her father when he was age 53.  Patient presents to the ED with c/o CP.  CP onset at rest 1h PTA.  Onset while eating dinner with mother at [employer].  CP, tightness, sharp and worse with deep breath.  Initially between shoulder blades but migrated to anterior chest where it is currently.  6/10 intermittently.  Improved to 4/10 after morphine.   ED Course: Initially tachycardic in ED.  CTA is neg for PE, also neg for aortic dissection.  Does show aortic atherosclerosis though.  Trop neg x2 thus far.  EKG initially normal.  Repeat does show TWI in III and V6, though symptoms improved at this time.   Review of Systems: As per HPI otherwise 10 point review of systems negative.   Past Medical History:  Diagnosis Date  . Hypertension   . Migraines   . Obesity   . Pseudotumor cerebri   . Sleep apnea     Past Surgical History:  Procedure Laterality Date  . ARTHROSCOPIC REPAIR ACL    . CESAREAN SECTION    . CHOLECYSTECTOMY    . KNEE SURGERY     right  . MYOMECTOMY       reports that she has never smoked. She has never used smokeless tobacco. She reports current alcohol use. She reports that she does not use drugs.  Allergies  Allergen Reactions  . Imitrex [Sumatriptan Base] Anaphylaxis  . Prednisone Palpitations  . Wellbutrin [Bupropion] Anaphylaxis  . Yellow Jacket Venom [Bee Venom] Hives  . Ibuprofen Nausea And Vomiting  . Penicillins Hives    Has patient had a PCN reaction causing immediate rash, facial/tongue/throat swelling, SOB or lightheadedness with hypotension: yes Has patient had a  PCN reaction causing severe rash involving mucus membranes or skin necrosis: yes Has patient had a PCN reaction that required hospitalization: no Has patient had a PCN reaction occurring within the last 10 years: yes If all of the above answers are "NO", then may proceed with Cephalosporin use.    Family History  Problem Relation Age of Onset  . Heart attack Father 19     Prior to Admission medications   Medication Sig Start Date End Date Taking? Authorizing Provider  acetaminophen (TYLENOL) 500 MG tablet Take 500-1,000 mg by mouth every 6 (six) hours as needed for mild pain.   Yes [provider]  albuterol (PROVENTIL HFA;VENTOLIN HFA) 108 (90 Base) MCG/ACT inhaler Inhale 1 puff into the lungs every 6 (six) hours as needed for wheezing or shortness of breath.  11/19/16  Yes [provider]  diclofenac sodium (VOLTAREN) 1 % GEL Apply 2 g topically daily as needed (pain).   Yes [provider]  hydrochlorothiazide (HYDRODIURIL) 25 MG tablet Take 25 mg by mouth daily.   Yes [provider]  Liraglutide -Weight Management (SAXENDA) 18 MG/3ML SOPN Inject 3 mLs into the skin daily.   Yes [provider]  losartan (COZAAR) 100 MG tablet Take 100 mg by mouth daily.   Yes [provider]  metoprolol succinate (TOPROL-XL) 25 MG 24 hr tablet Take 25 mg by  mouth daily.   Yes [provider]    Physical Exam: Vitals:   06/12/18 2048 06/12/18 2130 06/12/18 2200 06/12/18 2230  BP: (!) 158/96 (!) 148/89 131/88 128/77  Pulse: (!) 108 (!) 101 99 99  Resp: (!) 23 (!) 21 12 18   Temp:      TempSrc:      SpO2: 99% 97% 97% 96%  Weight:      Height:        Constitutional: NAD, calm, comfortable Eyes: PERRL, lids and conjunctivae normal ENMT: Mucous membranes are moist. Posterior pharynx clear of any exudate or lesions.Normal dentition.  Neck: normal, supple, no masses, no thyromegaly Respiratory: clear to auscultation bilaterally, no  wheezing, no crackles. Normal respiratory effort. No accessory muscle use.  Cardiovascular: Regular rate and rhythm, no murmurs / rubs / gallops. No extremity edema. 2+ pedal pulses. No carotid bruits.  Abdomen: no tenderness, no masses palpated. No hepatosplenomegaly. Bowel sounds positive.  Musculoskeletal: no clubbing / cyanosis. No joint deformity upper and lower extremities. Good ROM, no contractures. Normal muscle tone.  Skin: no rashes, lesions, ulcers. No induration Neurologic: CN 2-12 grossly intact. Sensation intact, DTR normal. Strength 5/5 in all 4.  Psychiatric: Normal judgment and insight. Alert and oriented x 3. Normal mood.    Labs on Admission: I have personally reviewed following labs and imaging studies  CBC: Recent Labs  Lab 06/12/18 1941  WBC 14.2*  HGB 13.9  HCT 42.4  MCV 85.7  PLT 683*   Basic Metabolic Panel: Recent Labs  Lab 06/12/18 1941  NA 137  K 3.5  CL 104  CO2 22  GLUCOSE 172*  BUN 23*  CREATININE 1.19*  CALCIUM 9.6   GFR: Estimated Creatinine Clearance: 75.1 mL/min (A) (by C-G formula based on SCr of 1.19 mg/dL (H)). Liver Function Tests: No results for input(s): AST, ALT, ALKPHOS, BILITOT, PROT, ALBUMIN in the last 168 hours. No results for input(s): LIPASE, AMYLASE in the last 168 hours. No results for input(s): AMMONIA in the last 168 hours. Coagulation Profile: No results for input(s): INR, PROTIME in the last 168 hours. Cardiac Enzymes: No results for input(s): CKTOTAL, CKMB, CKMBINDEX, TROPONINI in the last 168 hours. BNP (last 3 results) No results for input(s): PROBNP in the last 8760 hours. HbA1C: No results for input(s): HGBA1C in the last 72 hours. CBG: No results for input(s): GLUCAP in the last 168 hours. Lipid Profile: No results for input(s): CHOL, HDL, LDLCALC, TRIG, CHOLHDL, LDLDIRECT in the last 72 hours. Thyroid Function Tests: No results for input(s): TSH, T4TOTAL, FREET4, T3FREE, THYROIDAB in the last 72  hours. Anemia Panel: No results for input(s): VITAMINB12, FOLATE, FERRITIN, TIBC, IRON, RETICCTPCT in the last 72 hours. Urine analysis:    Component Value Date/Time   COLORURINE YELLOW 12/11/2016 2355   APPEARANCEUR CLEAR 12/11/2016 2355   LABSPEC 1.013 12/11/2016 2355   PHURINE 5.0 12/11/2016 2355   GLUCOSEU NEGATIVE 12/11/2016 2355   HGBUR NEGATIVE 12/11/2016 2355   BILIRUBINUR NEGATIVE 12/11/2016 2355   KETONESUR NEGATIVE 12/11/2016 2355   PROTEINUR NEGATIVE 12/11/2016 2355   UROBILINOGEN 0.2 12/03/2014 1319   NITRITE NEGATIVE 12/11/2016 2355   LEUKOCYTESUR NEGATIVE 12/11/2016 2355    Radiological Exams on Admission: Dg Chest 2 View  Result Date: 06/12/2018 CLINICAL DATA:  Chest pain EXAM: CHEST - 2 VIEW COMPARISON:  10/05/2016 FINDINGS: The heart size and mediastinal contours are within normal limits. Both lungs are clear. The visualized skeletal structures are unremarkable. IMPRESSION: No active cardiopulmonary disease. Electronically  Signed   By: Donavan Foil M.D.   On: 06/12/2018 20:20   Ct Angio Chest Pe W/cm &/or Wo Cm  Result Date: 06/12/2018 CLINICAL DATA:  Angina and dyspnea EXAM: CT ANGIOGRAPHY CHEST WITH CONTRAST TECHNIQUE: Multidetector CT imaging of the chest was performed using the standard protocol during bolus administration of intravenous contrast. Multiplanar CT image reconstructions and MIPs were obtained to evaluate the vascular anatomy. CONTRAST:  159mL ISOVUE-370 IOPAMIDOL (ISOVUE-370) INJECTION 76% COMPARISON:  Radiograph 06/12/2018 FINDINGS: Cardiovascular: Satisfactory opacification of the pulmonary arteries to the segmental level. No evidence of pulmonary embolism. Normal heart size. No pericardial effusion. Nonaneurysmal aorta. No dissection seen. Mild aortic atherosclerosis. Mediastinum/Nodes: No enlarged mediastinal, hilar, or axillary lymph nodes. Thyroid gland, trachea, and esophagus demonstrate no significant findings. Lungs/Pleura: Lungs are clear. No  pleural effusion or pneumothorax. Upper Abdomen: No acute abnormality. Musculoskeletal: No chest wall abnormality. No acute or significant osseous findings. Review of the MIP images confirms the above findings. IMPRESSION: Negative. No CT evidence for acute pulmonary embolus or aortic dissection. Clear lung fields. Aortic Atherosclerosis (ICD10-I70.0). Electronically Signed   By: Donavan Foil M.D.   On: 06/12/2018 21:17    EKG: Independently reviewed.  Assessment/Plan Principal Problem:   Chest pain, rule out acute myocardial infarction Active Problems:   HTN (hypertension)   Obesity    1. CP r/o - 1. Cards fellow was unimpressed with EKG changes, said patient could stay at [facility] 2. CP obs pathway 3. Serial trops 4. Tele monitor 5. NPO 6. Cards eval in AM 2. HTN - continue home BP meds 3. Obesity - continue Saxenda  DVT prophylaxis: Lovenox Code Status: Full Family Communication: No family in room Disposition Plan: Home after admit Consults called: Message sent to P. Trent for cards eval in AM Admission status: Place in [place], [place] [facility] Pager [phone] Only works nights!  If 7AM-7PM, please contact the primary day team physician taking care of patient  [email or web address] Password [facility]  06/13/2018, 12:53 AM

## 2018-06-13 NOTE — Progress Notes (Signed)
   Annette Deleon presented for a nuclear stress test today.  No immediate complications.  Stress imaging is pending at this time.  Preliminary EKG findings may be listed in the chart, but the stress test result will not be finalized until perfusion imaging is complete.  1 day study, [facility] to read.  Rosaria Ferries, PA-C 06/13/2018, 11:53 AM

## 2018-06-13 NOTE — Progress Notes (Signed)
Pt discharged home today per Dr. Krishnan. Pt's IV site D/C'd and WDL. Pt's VSS. Pt provided with home medication list, discharge instructions and prescriptions. Verbalized understanding. Pt left floor via WC in stable condition accompanied by RN.  

## 2018-06-13 NOTE — Progress Notes (Signed)
CRITICAL VALUE ALERT  Critical Value:  Troponin  Date & Time Notied:  06/13/2018 0319   Provider Notified: Jennette Kettle Orders Received/Actions taken:awaiting orders will  continue to monitor closley

## 2018-06-13 NOTE — Progress Notes (Signed)
Patient admitted earlier this morning.  H&P reviewed.  Patient seen and examined.  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  She presented with chest pain.  Some of the features were atypical but the features were concerning.  She was hospitalized for further management.    S: Patient had chest pain overnight at about 3:00 this morning.  No chest pain currently.  She did have pain in her back which is now resolved.  O: Vital signs stable this morning.  Lungs are clear to auscultation bilaterally.  No wheezing rales or rhonchi S1-S2 is normal regular.  No S3-S4.  No rubs murmurs or bruit Abdomen is soft.  Nontender nondistended. She is alert and oriented x3.  No focal neurological deficits.  Troponin noted to be 0.03 this morning followed by 0.05.  LDL 177.   EKG showed concern for ST depression and T inversion in the inferior leads.  A/P: Patient presented with chest pain which is not completely typical for angina however with concerning features.  Patient with mildly abnormal troponin levels.  New changes on EKG also noted.  CT angiogram did not show any dissection.   Seen by cardiology and plan is for a stress test today.  She is on aspirin.  Started on statin.  She is on metoprolol losartan and hydrochlorothiazide.  Will wait on results of stress test.  Annette Deleon 06/13/2018

## 2018-06-13 NOTE — ED Notes (Signed)
ED TO INPATIENT HANDOFF REPORT  Name/Age/Gender Annette Deleon 43 y.o. female  Code Status    Code Status Orders  (From admission, onward)         Start     Ordered   06/13/18 0035  Full code  Continuous     06/13/18 0035        Code Status History    Date Active Date Inactive Code Status Order ID Comments User Context   11/20/2011 1303 11/21/2011 1836 Full Code [id number]  Doree Albee, MD ED      Home/SNF/Other Home  Chief Complaint Chest pain, SOB  Level of Care/Admitting Diagnosis ED Disposition    ED Disposition Condition [facility]: [facility] [[id number]]  Level of Care: Telemetry [5]  Admit to tele based on following criteria: Monitor for Ischemic changes  Diagnosis: Chest pain, rule out acute myocardial infarction [527782]  Admitting Physician: Etta Quill [[id number]]  Attending Physician: Etta Quill [[id number]]  PT Class (Do Not Modify): Observation [104]  PT Acc Code (Do Not Modify): Observation [10022]       Medical History Past Medical History:  Diagnosis Date  . Hypertension   . Migraines   . Obesity   . Pseudotumor cerebri   . Sleep apnea     Allergies Allergies  Allergen Reactions  . Imitrex [Sumatriptan Base] Anaphylaxis  . Prednisone Palpitations  . Wellbutrin [Bupropion] Anaphylaxis  . Yellow Jacket Venom [Bee Venom] Hives  . Ibuprofen Nausea And Vomiting  . Penicillins Hives    Has patient had a PCN reaction causing immediate rash, facial/tongue/throat swelling, SOB or lightheadedness with hypotension: yes Has patient had a PCN reaction causing severe rash involving mucus membranes or skin necrosis: yes Has patient had a PCN reaction that required hospitalization: no Has patient had a PCN reaction occurring within the last 10 years: yes If all of the above answers are "NO", then may proceed with Cephalosporin use.    IV Location/Drains/Wounds Patient Lines/Drains/Airways Status    Active Line/Drains/Airways    Name:   Placement date:   Placement time:   Site:   Days:   Peripheral IV 06/12/18 Right Arm   06/12/18    2039    Arm   1          Labs/Imaging Results for orders placed or performed during the hospital encounter of 06/12/18 (from the past 48 hour(s))  Basic metabolic panel     Status: Abnormal   Collection Time: 06/12/18  7:41 PM  Result Value Ref Range   Sodium 137 135 - 145 mmol/L   Potassium 3.5 3.5 - 5.1 mmol/L   Chloride 104 98 - 111 mmol/L   CO2 22 22 - 32 mmol/L   Glucose, Bld 172 (H) 70 - 99 mg/dL   BUN 23 (H) 6 - 20 mg/dL   Creatinine, Ser 1.19 (H) 0.44 - 1.00 mg/dL   Calcium 9.6 8.9 - 10.3 mg/dL   GFR calc non Af Amer 55 (L) >60 mL/min   GFR calc Af Amer >60 >60 mL/min   Anion gap 11 5 - 15    Comment: Performed at [facility], [place] [address], [place], [place] [address]  CBC     Status: Abnormal   Collection Time: 06/12/18  7:41 PM  Result Value Ref Range   WBC 14.2 (H) 4.0 - 10.5 K/uL   RBC 4.95 3.87 - 5.11 MIL/uL   Hemoglobin 13.9 12.0 -  15.0 g/dL   HCT 42.4 36.0 - 46.0 %   MCV 85.7 80.0 - 100.0 fL   MCH 28.1 26.0 - 34.0 pg   MCHC 32.8 30.0 - 36.0 g/dL   RDW 13.8 11.5 - 15.5 %   Platelets 530 (H) 150 - 400 K/uL   nRBC 0.0 0.0 - 0.2 %    Comment: Performed at [facility], [place] [address] [place], [place] [address]  I-stat troponin, ED     Status: None   Collection Time: 06/12/18  7:49 PM  Result Value Ref Range   Troponin i, poc 0.00 0.00 - 0.08 ng/mL   Comment 3            Comment: Due to the release kinetics of cTnI, a negative result within the first hours of the onset of symptoms does not rule out myocardial infarction with certainty. If myocardial infarction is still suspected, repeat the test at appropriate intervals.   I-Stat beta hCG blood, ED     Status: None   Collection Time: 06/12/18  7:49 PM  Result Value Ref Range   I-stat hCG, quantitative <5.0 <5 mIU/mL   Comment  3            Comment:   GEST. AGE      CONC.  (mIU/mL)   <=1 WEEK        5 - 50     2 WEEKS       50 - 500     3 WEEKS       100 - 10,000     4 WEEKS     1,000 - 30,000        FEMALE AND NON-PREGNANT FEMALE:     LESS THAN 5 mIU/mL   I-Stat Troponin, ED (not at [facility])     Status: None   Collection Time: 06/12/18 10:50 PM  Result Value Ref Range   Troponin i, poc 0.00 0.00 - 0.08 ng/mL   Comment 3            Comment: Due to the release kinetics of cTnI, a negative result within the first hours of the onset of symptoms does not rule out myocardial infarction with certainty. If myocardial infarction is still suspected, repeat the test at appropriate intervals.    Dg Chest 2 View  Result Date: 06/12/2018 CLINICAL DATA:  Chest pain EXAM: CHEST - 2 VIEW COMPARISON:  10/05/2016 FINDINGS: The heart size and mediastinal contours are within normal limits. Both lungs are clear. The visualized skeletal structures are unremarkable. IMPRESSION: No active cardiopulmonary disease. Electronically Signed   By: Donavan Foil M.D.   On: 06/12/2018 20:20   Ct Angio Chest Pe W/cm &/or Wo Cm  Result Date: 06/12/2018 CLINICAL DATA:  Angina and dyspnea EXAM: CT ANGIOGRAPHY CHEST WITH CONTRAST TECHNIQUE: Multidetector CT imaging of the chest was performed using the standard protocol during bolus administration of intravenous contrast. Multiplanar CT image reconstructions and MIPs were obtained to evaluate the vascular anatomy. CONTRAST:  161mL ISOVUE-370 IOPAMIDOL (ISOVUE-370) INJECTION 76% COMPARISON:  Radiograph 06/12/2018 FINDINGS: Cardiovascular: Satisfactory opacification of the pulmonary arteries to the segmental level. No evidence of pulmonary embolism. Normal heart size. No pericardial effusion. Nonaneurysmal aorta. No dissection seen. Mild aortic atherosclerosis. Mediastinum/Nodes: No enlarged mediastinal, hilar, or axillary lymph nodes. Thyroid gland, trachea, and esophagus demonstrate no significant  findings. Lungs/Pleura: Lungs are clear. No pleural effusion or pneumothorax. Upper Abdomen: No acute abnormality. Musculoskeletal: No chest wall abnormality. No acute or  significant osseous findings. Review of the MIP images confirms the above findings. IMPRESSION: Negative. No CT evidence for acute pulmonary embolus or aortic dissection. Clear lung fields. Aortic Atherosclerosis (ICD10-I70.0). Electronically Signed   By: Donavan Foil M.D.   On: 06/12/2018 21:17    Pending Labs Unresulted Labs (From admission, onward)    Start     Ordered   06/13/18 0500  Lipid panel  Tomorrow morning,   R     06/13/18 0047   06/13/18 0036  Troponin I - Now Then Q3H  Now then every 3 hours,   TIMED     06/13/18 0035   06/13/18 0032  HIV antibody (Routine Testing)  Once,   R     06/13/18 0035   06/13/18 0032  TSH  Once,   R     06/13/18 0035          Vitals/Pain Today's Vitals   06/12/18 2130 06/12/18 2200 06/12/18 2230 06/13/18 0102  BP: (!) 148/89 131/88 128/77 130/82  Pulse: (!) 101 99 99 93  Resp: (!) 21 12 18 20   Temp:      TempSrc:      SpO2: 97% 97% 96% 98%  Weight:      Height:      PainSc:        Isolation Precautions No active isolations  Medications Medications  sodium chloride (PF) 0.9 % injection (has no administration in time range)  iopamidol (ISOVUE-370) 76 % injection (has no administration in time range)  nitroGLYCERIN (NITROSTAT) SL tablet 0.4 mg (0.4 mg Sublingual Given 06/13/18 0102)  acetaminophen (TYLENOL) tablet 650 mg (has no administration in time range)  ondansetron (ZOFRAN) injection 4 mg (has no administration in time range)  enoxaparin (LOVENOX) injection 40 mg (has no administration in time range)  Liraglutide -Weight Management SOPN 3 mL (has no administration in time range)  losartan (COZAAR) tablet 100 mg (has no administration in time range)  metoprolol succinate (TOPROL-XL) 24 hr tablet 25 mg (has no administration in time range)  albuterol (PROVENTIL  HFA;VENTOLIN HFA) 108 (90 Base) MCG/ACT inhaler 1 puff (has no administration in time range)  hydrochlorothiazide (HYDRODIURIL) tablet 25 mg (has no administration in time range)  acetaminophen (TYLENOL) tablet 500-1,000 mg (has no administration in time range)  sodium chloride 0.9 % bolus 1,000 mL ( Intravenous Stopped 06/12/18 2132)  aspirin chewable tablet 324 mg (324 mg Oral Given 06/12/18 2027)  morphine 4 MG/ML injection 4 mg (4 mg Intravenous Given 06/12/18 2044)  iopamidol (ISOVUE-370) 76 % injection 100 mL (100 mLs Intravenous Contrast Given 06/12/18 2100)  ondansetron (ZOFRAN) injection 4 mg (4 mg Intravenous Given 06/12/18 2128)    Mobility walks

## 2018-06-13 NOTE — Discharge Summary (Signed)
[facility]  Physician Discharge Summary   Patient ID: Annette Deleon MRN: [id number] DOB/AGE: [birth year] 43 y.o.  Admit date: 06/12/2018 Discharge date: 06/13/2018  PCP: Gregor Hams, FNP  DISCHARGE DIAGNOSES:  Atypical chest pain Negative stress test Essential hypertension Dyslipidemia   RECOMMENDATIONS FOR OUTPATIENT FOLLOW UP: 1. Patient to follow-up with PCP for further evaluation 2. 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  She presented with chest pain.  Some of the features were atypical but the features were concerning.  She was hospitalized for further management.    Consultations:  Cardiology  Procedures:  Nuclear stress test: Low risk study.  Normal EF.    HOSPITAL COURSE:   43 year old female presented with chest pain.  She was hospitalized for further management.  Due to her risk factors she was seen by cardiology and underwent a nuclear stress test.  This was a low risk study.  Normal EF.  Possible breast attenuation.  Patient is asymptomatic currently.  She had nonspecific troponin levels.  Discussed with Dr. Oval Linsey with cardiology.  Cleared by cardiology for discharge.  Reason for her symptoms not entirely clear.  She also had a CT angiogram which was negative for dissection.  She can discuss this further with her primary care provider.  Dyslipidemia was noted during this hospitalization with LDL of 177.  She was not on a statin and so 1 was prescribed for her.  She has been asked to continue home medications for  her essential hypertension.  Overall stable.  Okay for discharge home today.    PERTINENT LABS:  The results of significant diagnostics from this hospitalization (including imaging, microbiology, ancillary and laboratory) are listed below for reference.      Labs: Basic Metabolic Panel: Recent Labs  Lab 06/12/18 1941  NA 137  K 3.5  CL 104  CO2 22  GLUCOSE 172*  BUN 23*  CREATININE 1.19*  CALCIUM 9.6   CBC: Recent Labs  Lab 06/12/18 1941  WBC 14.2*  HGB 13.9  HCT 42.4  MCV 85.7  PLT 530*   Cardiac Enzymes: Recent Labs  Lab 06/13/18 0245 06/13/18 1007  TROPONINI 0.03* 0.05*     IMAGING STUDIES Dg Chest 2 View  Result Date: 06/12/2018 CLINICAL DATA:  Chest pain EXAM: CHEST - 2 VIEW COMPARISON:  10/05/2016 FINDINGS: The heart size and mediastinal contours are within normal limits. Both lungs are clear. The visualized skeletal structures are unremarkable. IMPRESSION: No active cardiopulmonary disease. Electronically Signed   By: Donavan Foil M.D.   On: 06/12/2018 20:20   Ct Angio Chest Pe W/cm &/or Wo Cm  Result Date: 06/12/2018 CLINICAL DATA:  Angina and dyspnea EXAM: CT ANGIOGRAPHY CHEST WITH CONTRAST TECHNIQUE: Multidetector CT imaging of the chest was performed using the standard protocol during bolus administration of intravenous contrast. Multiplanar CT image reconstructions and MIPs were obtained to evaluate the vascular anatomy. CONTRAST:  125mL ISOVUE-370 IOPAMIDOL (ISOVUE-370) INJECTION 76% COMPARISON:  Radiograph 06/12/2018 FINDINGS: Cardiovascular: Satisfactory opacification of the pulmonary arteries to the segmental level. No  evidence of pulmonary embolism. Normal heart size. No pericardial effusion. Nonaneurysmal aorta. No dissection seen. Mild aortic atherosclerosis. Mediastinum/Nodes: No enlarged mediastinal, hilar, or axillary lymph nodes. Thyroid gland, trachea, and esophagus demonstrate no significant findings. Lungs/Pleura: Lungs are clear. No  pleural effusion or pneumothorax. Upper Abdomen: No acute abnormality. Musculoskeletal: No chest wall abnormality. No acute or significant osseous findings. Review of the MIP images confirms the above findings. IMPRESSION: Negative. No CT evidence for acute pulmonary embolus or aortic dissection. Clear lung fields. Aortic Atherosclerosis (ICD10-I70.0). Electronically Signed   By: Donavan Foil M.D.   On: 06/12/2018 21:17   Nm Myocar Multi W/spect W/wall Motion / Ef  Result Date: 06/13/2018  There was no ST segment deviation noted during stress.  Defect 1: There is a small defect of moderate severity present in the apical lateral location.  Findings concerning for mild ischemia. However, this likely represents breast attenuation artifact given her body habitus and normal wall motion.  This is a low risk study.  The left ventricular ejection fraction is normal (55-65%).     DISCHARGE EXAMINATION: Vitals:   06/13/18 1150 06/13/18 1152 06/13/18 1154 06/13/18 1344  BP: (!) 173/77 (!) 163/82 (!) 163/90 (!) 143/84  Pulse:    73  Resp:    18  Temp:    98.3 F (36.8 C)  TempSrc:    Oral  SpO2:    98%  Weight:      Height:       General appearance: alert, cooperative, appears stated age and no distress Resp: clear to auscultation bilaterally Cardio: regular rate and rhythm, S1, S2 normal, no murmur, click, rub or gallop GI: soft, non-tender; bowel sounds normal; no masses,  no organomegaly  DISPOSITION: Home  Discharge Instructions    Call MD for:  difficulty breathing, headache or visual disturbances   Complete by:  As directed    Call MD for:  extreme fatigue   Complete by:  As directed    Call MD for:  persistant dizziness or light-headedness   Complete by:  As directed    Call MD for:  persistant nausea and vomiting   Complete by:  As directed    Call MD for:  severe uncontrolled pain   Complete by:  As directed    Call MD for:  temperature >100.4   Complete by:  As directed     Diet - low sodium heart healthy   Complete by:  As directed    Discharge instructions   Complete by:  As directed    Please follow-up with your primary care provider.  You will need to have a lipid panel rechecked in 6 weeks.  Take your medications as prescribed.  The stress test that you had this morning was a low risk study.  Reason for your symptoms is not entirely clear.  Please discuss this further with your PCP.  You were cared for by a hospitalist during your hospital stay. If you have any questions about your discharge medications or the care you received while you were in the hospital after you are discharged, you can call the unit and asked to speak with the hospitalist on call if the hospitalist that took care of you is not available. Once you are discharged, your primary care physician will handle any further medical issues. Please note that NO REFILLS for any discharge medications will be authorized once you are discharged, as it is imperative that you return to your primary care physician (or establish  a relationship with a primary care physician if you do not have one) for your aftercare needs so that they can reassess your need for medications and monitor your lab values. If you do not have a primary care physician, you can call [phone] for a physician referral.   Increase activity slowly   Complete by:  As directed         Allergies as of 06/13/2018      Reactions   Imitrex [sumatriptan Base] Anaphylaxis   Prednisone Palpitations   Wellbutrin [bupropion] Anaphylaxis   Yellow Jacket Venom [bee Venom] Hives   Ibuprofen Nausea And Vomiting   Penicillins Hives   Has patient had a PCN reaction causing immediate rash, facial/tongue/throat swelling, SOB or lightheadedness with hypotension: yes Has patient had a PCN reaction causing severe rash involving mucus membranes or skin necrosis: yes Has patient had a PCN reaction that required hospitalization: no Has patient had a PCN  reaction occurring within the last 10 years: yes If all of the above answers are "NO", then may proceed with Cephalosporin use.      Medication List    TAKE these medications   acetaminophen 500 MG tablet Commonly known as:  TYLENOL Take 500-1,000 mg by mouth every 6 (six) hours as needed for mild pain.   albuterol 108 (90 Base) MCG/ACT inhaler Commonly known as:  PROVENTIL HFA;VENTOLIN HFA Inhale 1 puff into the lungs every 6 (six) hours as needed for wheezing or shortness of breath.   atorvastatin 40 MG tablet Commonly known as:  LIPITOR Take 1 tablet (40 mg total) by mouth daily at 6 PM.   diclofenac sodium 1 % Gel Commonly known as:  VOLTAREN Apply 2 g topically daily as needed (pain).   hydrochlorothiazide 25 MG tablet Commonly known as:  HYDRODIURIL Take 25 mg by mouth daily.   losartan 100 MG tablet Commonly known as:  COZAAR Take 100 mg by mouth daily.   metoprolol succinate 25 MG 24 hr tablet Commonly known as:  TOPROL-XL Take 25 mg by mouth daily.   SAXENDA 18 MG/3ML Sopn Generic drug:  Liraglutide -Weight Management Inject 3 mLs into the skin daily.        Follow-up Information    Gregor Hams, FNP. Schedule an appointment as soon as possible for a visit in 1 week(s).   Specialty:  Family Medicine Contact information: [place] [address] [phone]           TOTAL DISCHARGE TIME: 35 mins  [facility] Pager [phone]  06/13/2018, 3:04 PM

## 2018-06-13 NOTE — Consult Note (Signed)
Cardiology Consultation:   Patient ID: Annette Deleon MRN: [id number]; DOB: April 06, 1976  Admit date: 06/12/2018 Date of Consult: 06/13/2018  Primary Care Provider: Gregor Hams, FNP Primary Cardiologist: No primary care provider on file.    Patient Profile:   Annette Deleon is a 43 y.o. female with a hx of hypertension and obesity who is being seen today for the evaluation of chest pain at the request of Dr. Alcario Drought.   History of Present Illness:   Annette Deleon had an injection in her knee yesterday.  After the injection she started having some shortness of breath.  She reports a an allergy to prednisone.  After leaving the doctor's office she went to [facility] [place] and noted some substernal chest discomfort that radiated around to her back.  She thought it may have been GERD.  It improves somewhat and later that evening she went to [place] to eat with her mother.  While there the pain recurred and was associated with difficulty catching her breath.  She became clammy and diaphoretic and had discomfort in her left arm.  She therefore decided to present to the emergency department. Initial EKG showed sinus tachycardia at 115 bpm.  She had a chest CT that was negative for pulmonary embolism.  It did reveal atherosclerosis of the aorta.  White blood cell count was elevated to 14.2.  Initial troponin was less than assay.  Her repeat was 0.03.  Of note, Annette Deleon recently started a keto diet.  She exercises regularly and generally has no exertional chest pain or shortness of breath.  She recently lost 30 pounds.  She has not been experiencing any lower extremity edema, orthopnea, or PND.  She works as a Marine scientist.  Her father had a heart attack at a young age and has several stents.  Her paternal grandmother also has a history of CAD.  She notes that she was diagnosed with hypertension in her teens but that has been well-controlled.  Past Medical History:  Diagnosis Date  . Hypertension   . Migraines    . Obesity   . Pseudotumor cerebri   . Sleep apnea     Past Surgical History:  Procedure Laterality Date  . ARTHROSCOPIC REPAIR ACL    . CESAREAN SECTION    . CHOLECYSTECTOMY    . KNEE SURGERY     right  . MYOMECTOMY       Home Medications:  Prior to Admission medications   Medication Sig Start Date End Date Taking? Authorizing Provider  acetaminophen (TYLENOL) 500 MG tablet Take 500-1,000 mg by mouth every 6 (six) hours as needed for mild pain.   Yes [provider]  albuterol (PROVENTIL HFA;VENTOLIN HFA) 108 (90 Base) MCG/ACT inhaler Inhale 1 puff into the lungs every 6 (six) hours as needed for wheezing or shortness of breath.  11/19/16  Yes [provider]  diclofenac sodium (VOLTAREN) 1 % GEL Apply 2 g topically daily as needed (pain).   Yes [provider]  hydrochlorothiazide (HYDRODIURIL) 25 MG tablet Take 25 mg by mouth daily.   Yes [provider]  Liraglutide -Weight Management (SAXENDA) 18 MG/3ML SOPN Inject 3 mLs into the skin daily.   Yes [provider]  losartan (COZAAR) 100 MG tablet Take 100 mg by mouth daily.   Yes [provider]  metoprolol succinate (TOPROL-XL) 25 MG 24 hr tablet Take 25 mg by mouth daily.   Yes [provider]    Inpatient Medications: Scheduled Meds: .  enoxaparin (LOVENOX) injection  40 mg Subcutaneous Q24H  . hydrochlorothiazide  25 mg Oral Daily  . iopamidol      . Liraglutide -Weight Management  3 mL Subcutaneous Daily  . losartan  100 mg Oral Daily  . metoprolol succinate  25 mg Oral Daily   Continuous Infusions:  PRN Meds: acetaminophen, albuterol, nitroGLYCERIN, ondansetron (ZOFRAN) IV  Allergies:    Allergies  Allergen Reactions  . Imitrex [Sumatriptan Base] Anaphylaxis  . Prednisone Palpitations  . Wellbutrin [Bupropion] Anaphylaxis  . Yellow [place] Venom [Bee Venom] Hives  . Ibuprofen Nausea And Vomiting  . Penicillins Hives    Has patient had a PCN  reaction causing immediate rash, facial/tongue/throat swelling, SOB or lightheadedness with hypotension: yes Has patient had a PCN reaction causing severe rash involving mucus membranes or skin necrosis: yes Has patient had a PCN reaction that required hospitalization: no Has patient had a PCN reaction occurring within the last 10 years: yes If all of the above answers are "NO", then may proceed with Cephalosporin use.    Social History:   Social History   Socioeconomic History  . Marital status: Single    Spouse name: Not on file  . Number of children: Not on file  . Years of education: Not on file  . Highest education level: Not on file  Occupational History  . Not on file  Social Needs  . Financial resource strain: Not on file  . Food insecurity:    Worry: Not on file    Inability: Not on file  . Transportation needs:    Medical: Not on file    Non-medical: Not on file  Tobacco Use  . Smoking status: Never Smoker  . Smokeless tobacco: Never Used  Substance and Sexual Activity  . Alcohol use: Yes    Comment: occasionally  . Drug use: No  . Sexual activity: Not on file  Lifestyle  . Physical activity:    Days per week: Not on file    Minutes per session: Not on file  . Stress: Not on file  Relationships  . Social connections:    Talks on phone: Not on file    Gets together: Not on file    Attends religious service: Not on file    Active member of club or organization: Not on file    Attends meetings of clubs or organizations: Not on file    Relationship status: Not on file  . Intimate partner violence:    Fear of current or ex partner: Not on file    Emotionally abused: Not on file    Physically abused: Not on file    Forced sexual activity: Not on file  Other Topics Concern  . Not on file  Social History Narrative  . Not on file    Family History:    Family History  Problem Relation Age of Onset  . Heart attack Father 19     ROS:  Please see the  history of present illness.   All other ROS reviewed and negative.     Physical Exam/Data:   Vitals:   06/12/18 2230 06/13/18 0102 06/13/18 0208 06/13/18 0511  BP: 128/77 130/82 125/75 123/65  Pulse: 99 93 95 76  Resp: 18 20 18 18   Temp:   98.2 F (36.8 C) 97.7 F (36.5 C)  TempSrc:   Oral   SpO2: 96% 98% 99% 95%  Weight:      Height:  Intake/Output Summary (Last 24 hours) at 06/13/2018 0820 Last data filed at 06/13/2018 0208 Gross per 24 hour  Intake 456.46 ml  Output 400 ml  Net 56.46 ml   Last 3 Weights 06/12/2018 05/10/2017 02/26/2017  Weight (lbs) 254 lb 276 lb 294 lb  Weight (kg) 115.214 kg 125.193 kg 133.358 kg     VS:  BP 123/65 (BP Location: Right Arm)   Pulse 76   Temp 97.7 F (36.5 C)   Resp 18   Ht 5\' 4"  (1.626 m)   Wt 115.2 kg   LMP 06/03/2018 (Approximate)   SpO2 95%   BMI 43.60 kg/m  , BMI Body mass index is 43.6 kg/m. GENERAL:  Well appearing HEENT: Pupils equal round and reactive, fundi not visualized, oral mucosa unremarkable NECK:  No jugular venous distention, waveform within normal limits, carotid upstroke brisk and symmetric, no bruits LUNGS:  Clear to auscultation bilaterally HEART:  RRR.  PMI not displaced or sustained,S1 and S2 within normal limits, no S3, no S4, no clicks, no rubs, no murmurs ABD:  Flat, positive bowel sounds normal in frequency in pitch, no bruits, no rebound, no guarding, no midline pulsatile mass, no hepatomegaly, no splenomegaly EXT:  2 plus pulses throughout, no edema, no cyanosis no clubbing SKIN:  No rashes no nodules NEURO:  Cranial nerves II through XII grossly intact, motor grossly intact throughout PSYCH:  Cognitively intact, oriented to person place and time   EKG:  The EKG was personally reviewed and demonstrates: Sinus tachycardia.  115 bpm. Telemetry:  Telemetry was personally reviewed and demonstrates: Sinus rhythm.  No events.  Relevant CV Studies: n/a  Laboratory Data:  Chemistry Recent Labs    Lab 06/12/18 1941  NA 137  K 3.5  CL 104  CO2 22  GLUCOSE 172*  BUN 23*  CREATININE 1.19*  CALCIUM 9.6  GFRNONAA 55*  GFRAA >60  ANIONGAP 11    No results for input(s): PROT, ALBUMIN, AST, ALT, ALKPHOS, BILITOT in the last 168 hours. Hematology Recent Labs  Lab 06/12/18 1941  WBC 14.2*  RBC 4.95  HGB 13.9  HCT 42.4  MCV 85.7  MCH 28.1  MCHC 32.8  RDW 13.8  PLT 530*   Cardiac Enzymes Recent Labs  Lab 06/13/18 0245  TROPONINI 0.03*    Recent Labs  Lab 06/12/18 1949 06/12/18 2250  TROPIPOC 0.00 0.00    BNPNo results for input(s): BNP, PROBNP in the last 168 hours.  DDimer No results for input(s): DDIMER in the last 168 hours.  Radiology/Studies:  Dg Chest 2 View  Result Date: 06/12/2018 CLINICAL DATA:  Chest pain EXAM: CHEST - 2 VIEW COMPARISON:  10/05/2016 FINDINGS: The heart size and mediastinal contours are within normal limits. Both lungs are clear. The visualized skeletal structures are unremarkable. IMPRESSION: No active cardiopulmonary disease. Electronically Signed   By: Donavan Foil M.D.   On: 06/12/2018 20:20   Ct Angio Chest Pe W/cm &/or Wo Cm  Result Date: 06/12/2018 CLINICAL DATA:  Angina and dyspnea EXAM: CT ANGIOGRAPHY CHEST WITH CONTRAST TECHNIQUE: Multidetector CT imaging of the chest was performed using the standard protocol during bolus administration of intravenous contrast. Multiplanar CT image reconstructions and MIPs were obtained to evaluate the vascular anatomy. CONTRAST:  190mL ISOVUE-370 IOPAMIDOL (ISOVUE-370) INJECTION 76% COMPARISON:  Radiograph 06/12/2018 FINDINGS: Cardiovascular: Satisfactory opacification of the pulmonary arteries to the segmental level. No evidence of pulmonary embolism. Normal heart size. No pericardial effusion. Nonaneurysmal aorta. No dissection seen. Mild aortic atherosclerosis. Mediastinum/Nodes: No  enlarged mediastinal, hilar, or axillary lymph nodes. Thyroid gland, trachea, and esophagus demonstrate no  significant findings. Lungs/Pleura: Lungs are clear. No pleural effusion or pneumothorax. Upper Abdomen: No acute abnormality. Musculoskeletal: No chest wall abnormality. No acute or significant osseous findings. Review of the MIP images confirms the above findings. IMPRESSION: Negative. No CT evidence for acute pulmonary embolus or aortic dissection. Clear lung fields. Aortic Atherosclerosis (ICD10-I70.0). Electronically Signed   By: Donavan Foil M.D.   On: 06/12/2018 21:17    Assessment and Plan:   # Chest pain: Annette Deleon has somewhat atypical chest pain.  However she has risk factors including hypertension and hyperlipidemia.  She also has a family history of premature CAD.  Troponin was initially negative and most recently was minimally positive at 0.03.  Therefore we will plan to get a Lexiscan Myoview prior to discharge from the hospital.  # Hyperlipidemia: # Atherosclerosis of the aorta: Cholesterol this admission revealed total cholesterol 232 and an LDL of 177.  We discussed the fact that her keto diet may be contributing.  We will start atorvastatin 40 mg for now.  Repeat lipids/CMP in 6 weeks.  # Hypertension:  BP controlled on HCTZ, losartan and metoprolol.      For questions or updates, please contact [place] Please consult [email or web address] for contact info under     Signed, Skeet Latch, MD  06/13/2018 8:20 AM

## 2019-03-18 ENCOUNTER — Other Ambulatory Visit: Payer: Self-pay

## 2019-03-18 ENCOUNTER — Emergency Department (HOSPITAL_COMMUNITY)
Admission: EM | Admit: 2019-03-18 | Discharge: 2019-03-18 | Disposition: A | Discharge status: Home / Self Care | Payer: Managed Care, Other (non HMO) | Attending: Emergency Medicine | Admitting: Emergency Medicine

## 2019-03-18 ENCOUNTER — Encounter (HOSPITAL_COMMUNITY): Payer: Self-pay | Admitting: Emergency Medicine

## 2019-03-18 DIAGNOSIS — Z5321 Procedure and treatment not carried out due to patient leaving prior to being seen by health care provider: Secondary | ICD-10-CM | POA: Insufficient documentation

## 2019-03-18 DIAGNOSIS — H5789 Other specified disorders of eye and adnexa: Secondary | ICD-10-CM | POA: Diagnosis not present

## 2019-03-18 DIAGNOSIS — R519 Headache, unspecified: Secondary | ICD-10-CM | POA: Diagnosis present

## 2019-03-18 NOTE — ED Notes (Signed)
Called 3 times to triage, no answer

## 2019-03-18 NOTE — ED Triage Notes (Signed)
Pt reports was seen at [facility] for her headache. Was sent to ED for her HTN and ICP due to right eye being red and bulging.

## 2019-03-19 ENCOUNTER — Other Ambulatory Visit: Payer: Self-pay

## 2019-03-19 ENCOUNTER — Encounter (HOSPITAL_COMMUNITY): Payer: Self-pay | Admitting: Emergency Medicine

## 2019-03-19 ENCOUNTER — Emergency Department (HOSPITAL_COMMUNITY)
Admission: EM | Admit: 2019-03-19 | Discharge: 2019-03-19 | Disposition: A | Discharge status: Home / Self Care | Payer: Managed Care, Other (non HMO) | Attending: Emergency Medicine | Admitting: Emergency Medicine

## 2019-03-19 DIAGNOSIS — Z79899 Other long term (current) drug therapy: Secondary | ICD-10-CM | POA: Insufficient documentation

## 2019-03-19 DIAGNOSIS — I10 Essential (primary) hypertension: Secondary | ICD-10-CM | POA: Diagnosis not present

## 2019-03-19 DIAGNOSIS — H11431 Conjunctival hyperemia, right eye: Secondary | ICD-10-CM | POA: Diagnosis present

## 2019-03-19 DIAGNOSIS — H1031 Unspecified acute conjunctivitis, right eye: Secondary | ICD-10-CM | POA: Insufficient documentation

## 2019-03-19 MED ORDER — CIPROFLOXACIN HCL 0.3 % OP SOLN: 1.0000 [drp] | OPHTHALMIC | 0 refills | Status: DC

## 2019-03-19 MED ORDER — LEVOFLOXACIN 0.5 % OP SOLN: 1.0000 [drp] | OPHTHALMIC | 0 refills | Status: AC

## 2019-03-19 NOTE — ED Provider Notes (Addendum)
[facility] EMERGENCY DEPARTMENT Provider Note   CSN: [id number] Arrival date & time: 03/19/19  0719     History   Chief Complaint Chief Complaint  Patient presents with  . Headache  . Eye Problem    HPI Annette Deleon is a 43 y.o. female.  Presents to the ER with right eye complaint.  Symptoms have been going on since yesterday morning.  Initially noted some mild redness progressed throughout the day.  No significant pain in her eye.  Has noted dull frontal headache.  Equal on both sides, states she feels some sinus pressure.  No nasal congestion or drainage.  No fevers.  No generalized redness or swelling around her face.  No vision changes.  States compared to her prior migraines, pseudotumor, the headache today is very mild.  No sudden onset.  No neck pain or neck stiffness.  Patient reports she has contact lenses but has not used them in a very long time.  She did not have any trauma to her eye.  Noted a small amount of itching in her eye and took Benadryl which seemed to help for a short period of time.  Noted small white drainage this morning from her right eye.  Took visene tears, no significant change noted.  Normally takes metoprolol, hydrochlorothiazide, losartan for blood pressure, has not taken her blood pressure medicines today.     HPI  Past Medical History:  Diagnosis Date  . Hypertension   . Migraines   . Obesity   . Pseudotumor cerebri   . Sleep apnea     Patient Active Problem List   Diagnosis Date Noted  . Chest pain, rule out acute myocardial infarction 06/13/2018  . Aortic atherosclerosis ([place])   . Pure hypercholesterolemia   . Near syncope 09/08/2014  . Complicated migraine [id number]  . HTN (hypertension) 11/20/2011  . Pseudotumor cerebri 11/20/2011  . Obesity 11/20/2011    Past Surgical History:  Procedure Laterality Date  . ARTHROSCOPIC REPAIR ACL    . CESAREAN SECTION    . CHOLECYSTECTOMY    . KNEE SURGERY     right  .  MYOMECTOMY       OB History   No obstetric history on file.      Home Medications    Prior to Admission medications   Medication Sig Start Date End Date Taking? Authorizing Provider  acetaminophen (TYLENOL) 500 MG tablet Take 500-1,000 mg by mouth every 6 (six) hours as needed for mild pain.    [provider]  albuterol (PROVENTIL HFA;VENTOLIN HFA) 108 (90 Base) MCG/ACT inhaler Inhale 1 puff into the lungs every 6 (six) hours as needed for wheezing or shortness of breath.  11/19/16   [provider]  atorvastatin (LIPITOR) 40 MG tablet Take 1 tablet (40 mg total) by mouth daily at 6 PM. 06/13/18   Bonnielee Haff, MD  diclofenac sodium (VOLTAREN) 1 % GEL Apply 2 g topically daily as needed (pain).    [provider]  hydrochlorothiazide (HYDRODIURIL) 25 MG tablet Take 25 mg by mouth daily.    [provider]  levofloxacin Theodoro Clock) 0.5 % ophthalmic solution Place 1 drop into the right eye as directed for 7 days. Apply 1 to 2 drops in right eye every 2 hours while awake (MAX, 8 times/day) on days 1 and 2; 1 to 2 drops every 4 hours while awake (MAX 4 times/day) on days 3 through 7 03/19/19 03/26/19  Lucrezia Starch, MD  Liraglutide -  Weight Management (SAXENDA) 18 MG/3ML SOPN Inject 3 mLs into the skin daily.    [provider]  losartan (COZAAR) 100 MG tablet Take 100 mg by mouth daily.    [provider]  metoprolol succinate (TOPROL-XL) 25 MG 24 hr tablet Take 25 mg by mouth daily.    [provider]    Family History Family History  Problem Relation Age of Onset  . Heart attack Father 19    Social History Social History   Tobacco Use  . Smoking status: Never Smoker  . Smokeless tobacco: Never Used  Substance Use Topics  . Alcohol use: Yes    Comment: occasionally  . Drug use: No     Allergies   Imitrex [sumatriptan base], Prednisone, Wellbutrin [bupropion], Yellow jacket venom [bee venom], Ibuprofen, and  Penicillins   Review of Systems Review of Systems  Constitutional: Negative for chills and fever.  HENT: Negative for ear pain and sore throat.   Eyes: Positive for pain, redness and itching. Negative for visual disturbance.  Respiratory: Negative for cough and shortness of breath.   Cardiovascular: Negative for chest pain and palpitations.  Gastrointestinal: Negative for abdominal pain and vomiting.  Genitourinary: Negative for dysuria and hematuria.  Musculoskeletal: Negative for arthralgias and back pain.  Skin: Negative for color change and rash.  Neurological: Negative for seizures and syncope.  All other systems reviewed and are negative.    Physical Exam Updated Vital Signs BP (!) 179/114 (BP Location: Right Arm)   Pulse 88   Temp 98.5 F (36.9 C)   Resp 20   SpO2 100%   Physical Exam Vitals signs and nursing note reviewed.  Constitutional:      General: She is not in acute distress.    Appearance: She is well-developed.  HENT:     Head: Normocephalic and atraumatic.  Eyes:     Conjunctiva/sclera: Conjunctivae normal.     Comments: Right eye conjunctival injection, normal-appearing iris and pupil, small white drainage noted.  There is no surrounding erythema, no significant swelling noted around orbit; pupils are equal round and reactive to light, vision is grossly intact in both eyes.  Neck:     Musculoskeletal: Normal range of motion and neck supple. No neck rigidity.  Cardiovascular:     Rate and Rhythm: Normal rate and regular rhythm.     Heart sounds: No murmur.  Pulmonary:     Effort: Pulmonary effort is normal. No respiratory distress.     Breath sounds: Normal breath sounds.  Abdominal:     Palpations: Abdomen is soft.     Tenderness: There is no abdominal tenderness.  Skin:    General: Skin is warm and dry.  Neurological:     Mental Status: She is alert.     Comments: Cranial nerves II through XII intact, normal finger-nose-finger, bilateral upper  and lower extremity strength intact, sensation to light touch intact in all 4 extremities      ED Treatments / Results  Labs (all labs ordered are listed, but only abnormal results are displayed) Labs Reviewed - No data to display  EKG None  Radiology No results found.  Procedures Procedures (including critical care time)  Medications Ordered in ED Medications - No data to display   Initial Impression / Assessment and Plan / ED Course  I have reviewed the triage vital signs and the nursing notes.  Pertinent labs & imaging results that were available during my care of the patient were reviewed by  me and considered in my medical decision making (see chart for details).     43 year old lady presents with right eye redness, drainage.  No significant pain, no vision change, no trauma.  No recent contact lens use but does have prior history.  Concern for conjunctivitis, given new discharge, raise concern for bacterial etiology.  Will cover with topical antibiotic, add pseudomonal coverage given prior history of contact lens use.  Levofloxacin.  No evidence for surrounding orbital cellulitis.  No vision disturbance.  Has mild headache today but much more mild but her prior history of migraines, pseudotumor cerebri, today is normal neurologic exam.  Additionally recommend warm compresses and recheck with primary doctor.  Recommend return to ER for any worsening symptoms.    After the discussed management above, the patient was determined to be safe for discharge.  The patient was in agreement with this plan and all questions regarding their care were answered.  ED return precautions were discussed and the patient will return to the ED with any significant worsening of condition.   Final Clinical Impressions(s) / ED Diagnoses   Final diagnoses:  Acute conjunctivitis of right eye, unspecified acute conjunctivitis type    ED Discharge Orders         Ordered    levofloxacin (QUIXIN)  0.5 % ophthalmic solution  As directed     03/19/19 0942    ciprofloxacin (CILOXAN) 0.3 % ophthalmic solution  Every 2 hours     03/19/19 1048           Lucrezia Starch, MD 03/19/19 [phone]   Addendum, pharmacy did not have levofloxacin drops, replaced with ciprofloxacin ophthalmic drops.    Lucrezia Starch, MD 03/19/19 [phone]

## 2019-03-19 NOTE — ED Triage Notes (Signed)
Pt reports frontal headache and R eye swelling/pressure/redness that began yesterday. States she went to [facility] and was sent to the ED because she has psueudotumor cebrebri and the PA there was concerned swelling in her eye could be related, went to [facility] but lwbs due to wait. States she has had watery drainage from R eye.

## 2019-03-19 NOTE — ED Notes (Signed)
Pt is going home to take BP meds.

## 2019-03-19 NOTE — Discharge Instructions (Signed)
Recommend taking antibiotic as prescribed.  If the redness spreads over your eyelids, face, you develop fever, vision changes, worsening headache, please return to ER for reassessment.  Otherwise recommend recheck with your primary doctor on Monday.  Recommend additionally using warm compresses 3-4 times daily.  Can take benadryl PO for itching. Do not use your contacts until this infection has cleared.

## 2019-03-20 ENCOUNTER — Other Ambulatory Visit: Payer: Self-pay

## 2019-03-20 ENCOUNTER — Encounter (HOSPITAL_BASED_OUTPATIENT_CLINIC_OR_DEPARTMENT_OTHER): Payer: Self-pay | Admitting: Emergency Medicine

## 2019-03-20 ENCOUNTER — Emergency Department (HOSPITAL_BASED_OUTPATIENT_CLINIC_OR_DEPARTMENT_OTHER)
Admission: EM | Admit: 2019-03-20 | Discharge: 2019-03-20 | Disposition: A | Discharge status: Home / Self Care | Payer: Managed Care, Other (non HMO) | Attending: Emergency Medicine | Admitting: Emergency Medicine

## 2019-03-20 DIAGNOSIS — Z79899 Other long term (current) drug therapy: Secondary | ICD-10-CM | POA: Diagnosis not present

## 2019-03-20 DIAGNOSIS — I10 Essential (primary) hypertension: Secondary | ICD-10-CM | POA: Diagnosis not present

## 2019-03-20 DIAGNOSIS — H1031 Unspecified acute conjunctivitis, right eye: Secondary | ICD-10-CM | POA: Insufficient documentation

## 2019-03-20 DIAGNOSIS — R519 Headache, unspecified: Secondary | ICD-10-CM | POA: Diagnosis not present

## 2019-03-20 DIAGNOSIS — H579 Unspecified disorder of eye and adnexa: Secondary | ICD-10-CM | POA: Diagnosis present

## 2019-03-20 MED ORDER — TETRACAINE HCL 0.5 % OP SOLN
2.0000 [drp] | Freq: Once | OPHTHALMIC | Status: AC
  Administered 2019-03-20: 2 [drp] via OPHTHALMIC
  Filled 2019-03-20: qty 4

## 2019-03-20 NOTE — ED Notes (Signed)
Work note given

## 2019-03-20 NOTE — ED Provider Notes (Signed)
[place] EMERGENCY DEPARTMENT Provider Note   CSN: [id number] Arrival date & time: 03/20/19  1631     History   Chief Complaint Chief Complaint  Patient presents with  . Headache  . Conjunctivitis    HPI Annette Deleon is a 43 y.o. female.     Patient with history of pseudotumor cerebri presenting for follow-up for right red eye.   Yesterday patient was also hypertensive systolics in the [id number] at urgent care and sent to ED due to symptoms, likely concern for acute glaucoma.Patient was diagnosed with conjunctivitis yesterday and given ciprofloxacin drops.  Patient reports no vision changes.  Says that the right eye has gotten worse.  Endorses some right frontal headache.  Denies any fever or chills.  Tolerating p.o. well.  Took blood pressure medication today.  Endorses some "scratchy "irritation with eye movement, but no overt pain.  Patient says that right eye swelling has gone down.     Past Medical History:  Diagnosis Date  . Hypertension   . Migraines   . Obesity   . Pseudotumor cerebri   . Sleep apnea     Patient Active Problem List   Diagnosis Date Noted  . Chest pain, rule out acute myocardial infarction 06/13/2018  . Aortic atherosclerosis ([place])   . Pure hypercholesterolemia   . Near syncope 09/08/2014  . Complicated migraine [id number]  . HTN (hypertension) 11/20/2011  . Pseudotumor cerebri 11/20/2011  . Obesity 11/20/2011    Past Surgical History:  Procedure Laterality Date  . ARTHROSCOPIC REPAIR ACL    . CESAREAN SECTION    . CHOLECYSTECTOMY    . KNEE SURGERY     right  . MYOMECTOMY       OB History   No obstetric history on file.      Home Medications    Prior to Admission medications   Medication Sig Start Date End Date Taking? Authorizing Provider  acetaminophen (TYLENOL) 500 MG tablet Take 500-1,000 mg by mouth every 6 (six) hours as needed for mild pain.    [provider]  albuterol (PROVENTIL HFA;VENTOLIN HFA)  108 (90 Base) MCG/ACT inhaler Inhale 1 puff into the lungs every 6 (six) hours as needed for wheezing or shortness of breath.  11/19/16   [provider]  atorvastatin (LIPITOR) 40 MG tablet Take 1 tablet (40 mg total) by mouth daily at 6 PM. 06/13/18   Bonnielee Haff, MD  ciprofloxacin (CILOXAN) 0.3 % ophthalmic solution Place 1 drop into the right eye every 2 (two) hours. Administer 1 drop, every 2 hours, while awake, for 2 days. Then 1 drop, every 4 hours, while awake, for the next 5 days. 03/19/19   Lucrezia Starch, MD  diclofenac sodium (VOLTAREN) 1 % GEL Apply 2 g topically daily as needed (pain).    [provider]  hydrochlorothiazide (HYDRODIURIL) 25 MG tablet Take 25 mg by mouth daily.    [provider]  levofloxacin Theodoro Clock) 0.5 % ophthalmic solution Place 1 drop into the right eye as directed for 7 days. Apply 1 to 2 drops in right eye every 2 hours while awake (MAX, 8 times/day) on days 1 and 2; 1 to 2 drops every 4 hours while awake (MAX 4 times/day) on days 3 through 7 03/19/19 03/26/19  Lucrezia Starch, MD  Liraglutide -Weight Management (SAXENDA) 18 MG/3ML SOPN Inject 3 mLs into the skin daily.    [provider]  losartan (COZAAR) 100 MG tablet Take 100 mg by  mouth daily.    [provider]  metoprolol succinate (TOPROL-XL) 25 MG 24 hr tablet Take 25 mg by mouth daily.    [provider]    Family History Family History  Problem Relation Age of Onset  . Heart attack Father 82    Social History Social History   Tobacco Use  . Smoking status: Never Smoker  . Smokeless tobacco: Never Used  Substance Use Topics  . Alcohol use: Yes    Comment: occasionally  . Drug use: No     Allergies   Imitrex [sumatriptan base], Prednisone, Wellbutrin [bupropion], Yellow jacket venom [bee venom], Ibuprofen, and Penicillins   Review of Systems Review of Systems As per HPI  Physical Exam Updated Vital Signs BP (!) 155/86  (BP Location: Left Arm)   Pulse 70   Temp 98.4 F (36.9 C) (Oral)   Resp 18   Ht 5\' 3"  (1.6 m)   Wt 113.9 kg   SpO2 100%   BMI 44.46 kg/m   Physical Exam Constitutional:      Appearance: She is well-developed. She is obese.  HENT:     Head: Normocephalic and atraumatic.     Mouth/Throat:     Mouth: Mucous membranes are moist.     Pharynx: Oropharynx is clear.  Eyes:     General: Lids are normal. Vision grossly intact. No scleral icterus.       Right eye: No foreign body or discharge.     Extraocular Movements: Extraocular movements intact.     Right eye: Normal extraocular motion.     Left eye: Normal extraocular motion.     Conjunctiva/sclera:     Right eye: Right conjunctiva is injected.     Left eye: Left conjunctiva is not injected.     Pupils: Pupils are equal, round, and reactive to light.  Neck:     Musculoskeletal: Normal range of motion and neck supple.  Cardiovascular:     Rate and Rhythm: Normal rate and regular rhythm.  Pulmonary:     Effort: Pulmonary effort is normal.     Breath sounds: Normal breath sounds.  Skin:    General: Skin is warm and dry.  Neurological:     General: No focal deficit present.     Mental Status: She is alert and oriented to person, place, and time. Mental status is at baseline.  Psychiatric:        Mood and Affect: Mood normal.        Behavior: Behavior normal.      ED Treatments / Results  Labs (all labs ordered are listed, but only abnormal results are displayed) Labs Reviewed - No data to display  EKG None  Radiology No results found.  Procedures Procedures (including critical care time)  Medications Ordered in ED Medications  tetracaine (PONTOCAINE) 0.5 % ophthalmic solution 2 drop (2 drops Right Eye Given by Other 03/20/19 1823)     Initial Impression / Assessment and Plan / ED Course  I have reviewed the triage vital signs and the nursing notes.  Pertinent labs & imaging results that were available  during my care of the patient were reviewed by me and considered in my medical decision making (see chart for details).        Patient following up for right inject dye.  Blood pressure better controlled today, systolics in the [id number].  Vision grossly intact.  No focal deficits.  No pain with eye movement.  Tonometry was attempted.  Patient  had readings 35 right eye, 40 q. in the left eye.  Tonometry was felt to be not calibrated properly.  Calibration was attempted multiple times but was unable to be completed.  Given that tonometry bilateral was roughly equal, patient had no vision changes in her affected eye, and the swelling is gone down it is felt that the patient likely has conjunctivitis.  But given that tonometry could not be completely evaluated to rule out glaucoma, we have referred patient to you follow-up with ophthalmology for further evaluation.  Return precautions were discussed with patient.  Patient to continue using her ciprofloxacin drops as prescribed.  Final Clinical Impressions(s) / ED Diagnoses   Final diagnoses:  Acute conjunctivitis of right eye, unspecified acute conjunctivitis type    ED Discharge Orders    None       Bonnita Hollow, MD 03/20/19 Ward Chatters    Drenda Freeze, MD 03/21/19 [phone]

## 2019-03-20 NOTE — ED Triage Notes (Signed)
Reports being seen and diagnosed with conjunctivitis and placed on cipro drops.  Now is having pain.  Eye swollen and very red.  Reports she was told to come back if she has worsening pain.

## 2019-03-20 NOTE — Discharge Instructions (Signed)
Please schedule appoint with ophthalmologist as referred to on the paper.  Please come back to the emergency room if you have any vision changes, have intense worsening pain or any other worrisome symptom especially related to the right eye.  Continue to use the ciprofloxacin eyedrops as prescribed.

## 2019-03-20 NOTE — ED Notes (Signed)
ED Provider at bedside. Dr. Yao 

## 2019-07-04 ENCOUNTER — Emergency Department (HOSPITAL_COMMUNITY)
Admission: EM | Admit: 2019-07-04 | Discharge: 2019-07-04 | Disposition: A | Discharge status: Home / Self Care | Payer: Managed Care, Other (non HMO) | Attending: Emergency Medicine | Admitting: Emergency Medicine

## 2019-07-04 ENCOUNTER — Other Ambulatory Visit: Payer: Self-pay

## 2019-07-04 DIAGNOSIS — Y999 Unspecified external cause status: Secondary | ICD-10-CM | POA: Diagnosis not present

## 2019-07-04 DIAGNOSIS — Z23 Encounter for immunization: Secondary | ICD-10-CM | POA: Insufficient documentation

## 2019-07-04 DIAGNOSIS — I10 Essential (primary) hypertension: Secondary | ICD-10-CM | POA: Diagnosis not present

## 2019-07-04 DIAGNOSIS — Z79899 Other long term (current) drug therapy: Secondary | ICD-10-CM | POA: Insufficient documentation

## 2019-07-04 DIAGNOSIS — T22232A Burn of second degree of left upper arm, initial encounter: Secondary | ICD-10-CM | POA: Insufficient documentation

## 2019-07-04 DIAGNOSIS — Y93G3 Activity, cooking and baking: Secondary | ICD-10-CM | POA: Insufficient documentation

## 2019-07-04 DIAGNOSIS — Y92010 Kitchen of single-family (private) house as the place of occurrence of the external cause: Secondary | ICD-10-CM | POA: Insufficient documentation

## 2019-07-04 DIAGNOSIS — T23262A Burn of second degree of back of left hand, initial encounter: Secondary | ICD-10-CM | POA: Insufficient documentation

## 2019-07-04 DIAGNOSIS — X088XXA Exposure to other specified smoke, fire and flames, initial encounter: Secondary | ICD-10-CM | POA: Insufficient documentation

## 2019-07-04 DIAGNOSIS — T22212A Burn of second degree of left forearm, initial encounter: Secondary | ICD-10-CM | POA: Diagnosis not present

## 2019-07-04 DIAGNOSIS — T22092A Burn of unspecified degree of multiple sites of left shoulder and upper limb, except wrist and hand, initial encounter: Secondary | ICD-10-CM | POA: Diagnosis present

## 2019-07-04 MED ORDER — OXYCODONE-ACETAMINOPHEN 5-325 MG PO TABS: 2.0000 | ORAL_TABLET | Freq: Four times a day (QID) | ORAL | 0 refills | Status: DC | PRN

## 2019-07-04 MED ORDER — HYDROMORPHONE HCL 1 MG/ML IJ SOLN
1.0000 mg | Freq: Once | INTRAMUSCULAR | Status: AC
  Administered 2019-07-04: 1 mg via INTRAVENOUS
  Filled 2019-07-04: qty 1

## 2019-07-04 MED ORDER — OXYCODONE-ACETAMINOPHEN 5-325 MG PO TABS
1.0000 | ORAL_TABLET | Freq: Once | ORAL | Status: AC
  Administered 2019-07-04: 17:00:00 1 via ORAL
  Filled 2019-07-04: qty 1

## 2019-07-04 MED ORDER — SILVER SULFADIAZINE 1 % EX CREA: 1.0000 "application " | TOPICAL_CREAM | Freq: Every day | CUTANEOUS | 3 refills | Status: DC

## 2019-07-04 MED ORDER — OXYCODONE-ACETAMINOPHEN 5-325 MG PO TABS
1.0000 | ORAL_TABLET | Freq: Once | ORAL | Status: AC
  Administered 2019-07-04: 1 via ORAL
  Filled 2019-07-04: qty 1

## 2019-07-04 MED ORDER — SILVER SULFADIAZINE 1 % EX CREA
TOPICAL_CREAM | Freq: Once | CUTANEOUS | Status: AC
  Filled 2019-07-04: qty 85

## 2019-07-04 MED ORDER — ONDANSETRON HCL 4 MG/2ML IJ SOLN
4.0000 mg | Freq: Once | INTRAMUSCULAR | Status: AC
  Administered 2019-07-04: 19:00:00 4 mg via INTRAVENOUS
  Filled 2019-07-04: qty 2

## 2019-07-04 MED ORDER — FENTANYL CITRATE (PF) 100 MCG/2ML IJ SOLN: 100.0000 ug | Freq: Once | INTRAMUSCULAR | Status: DC

## 2019-07-04 MED ORDER — HYDROMORPHONE HCL 1 MG/ML IJ SOLN
0.5000 mg | Freq: Once | INTRAMUSCULAR | Status: AC
  Administered 2019-07-04: 0.5 mg via INTRAVENOUS
  Filled 2019-07-04: qty 1

## 2019-07-04 MED ORDER — SODIUM CHLORIDE 0.9 % IV BOLUS
500.0000 mL | Freq: Once | INTRAVENOUS | Status: AC
  Administered 2019-07-04: 18:00:00 500 mL via INTRAVENOUS

## 2019-07-04 MED ORDER — TETANUS-DIPHTH-ACELL PERTUSSIS 5-2.5-18.5 LF-MCG/0.5 IM SUSP
0.5000 mL | Freq: Once | INTRAMUSCULAR | Status: AC
  Administered 2019-07-04: 17:00:00 0.5 mL via INTRAMUSCULAR
  Filled 2019-07-04: qty 0.5

## 2019-07-04 MED ORDER — FENTANYL CITRATE (PF) 100 MCG/2ML IJ SOLN
50.0000 ug | INTRAMUSCULAR | Status: AC | PRN
  Administered 2019-07-04 (×3): 50 ug via INTRAVENOUS
  Filled 2019-07-04 (×3): qty 2

## 2019-07-04 NOTE — Discharge Instructions (Addendum)
Please take Tylenol (acetaminophen) to relieve your pain.  You may take tylenol, up to 1,000 mg (two extra strength pills).  Do not take more than 3,000 mg tylenol in a 24 hour period.  Please check all medication labels as many medications such as pain and cold medications may contain tylenol. Please do not drink alcohol while taking this medication.   Today you received medications that may make you sleepy or impair your ability to make decisions.  For the next 24 hours please do not drive, operate heavy machinery, care for a small child with out another adult present, or perform any activities that may cause harm to you or someone else if you were to fall asleep or be impaired.   You are being prescribed a medication which may make you sleepy. Please follow up of listed precautions for at least 24 hours after taking one dose.
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
## 2019-07-05 ENCOUNTER — Encounter: Payer: Self-pay | Admitting: Plastic Surgery

## 2019-07-05 ENCOUNTER — Ambulatory Visit (INDEPENDENT_AMBULATORY_CARE_PROVIDER_SITE_OTHER): Payer: Managed Care, Other (non HMO) | Admitting: Plastic Surgery

## 2019-07-05 DIAGNOSIS — T23062A Burn of unspecified degree of back of left hand, initial encounter: Secondary | ICD-10-CM | POA: Diagnosis not present
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
## 2019-07-06 ENCOUNTER — Telehealth: Payer: Self-pay | Admitting: Plastic Surgery

## 2019-07-06 ENCOUNTER — Telehealth: Payer: Self-pay

## 2019-07-06 NOTE — Telephone Encounter (Signed)
Called patient, did not answer. Will try again later

## 2019-07-06 NOTE — Telephone Encounter (Signed)
Patient called requesting update on wound care supplies from [facility]. During her visit, Dr. Claudia Desanctis instructed her to do dressing changes daily and she would like to know when she can expect the wound care supplies.

## 2019-07-06 NOTE — Telephone Encounter (Signed)
Patient called to see if Dr.Pace could call in a few days of something that could help her relax because the pain meds she is taking is not even touching the pain. She said the pain kept her up most of the night. Please call her to advise. Pt uses [facility] on [address].

## 2019-07-07 ENCOUNTER — Encounter: Payer: Self-pay | Admitting: Plastic Surgery

## 2019-07-07 ENCOUNTER — Telehealth (INDEPENDENT_AMBULATORY_CARE_PROVIDER_SITE_OTHER): Payer: Managed Care, Other (non HMO) | Admitting: Surgical

## 2019-07-07 DIAGNOSIS — T23262D Burn of second degree of back of left hand, subsequent encounter: Secondary | ICD-10-CM

## 2019-07-07 MED ORDER — HYDROCODONE-ACETAMINOPHEN 5-325 MG PO TABS: 1.0000 | ORAL_TABLET | ORAL | 0 refills | Status: AC | PRN
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
## 2019-07-07 NOTE — Telephone Encounter (Signed)
Called and spoke with the patient regarding the message below.  Informed the patient that the form for medical supplies to [facility] was filled out and faxed, and we received order status notification stating that [facility] has provided service for the patient; no further action is required.  Patient stated that she has not received anything supplies yet.  Informed the patient that I can give her the number to [facility] if she would like to give them a call.  Patient stated that yes she would like the number and she will call them.  Number to [place] was given to the patient.//AB/CMA

## 2019-07-12 ENCOUNTER — Telehealth: Payer: Self-pay | Admitting: *Deleted

## 2019-07-12 NOTE — Telephone Encounter (Signed)
Placed order to [facility] on (07/06/19) for medical supplies for the patient.  Confirmation received.  Received order status notification from [facility].  Stating [facility] has provided service for the patient; no further action is required.//AB/CMA

## 2019-07-15 ENCOUNTER — Encounter: Payer: Self-pay | Admitting: Plastic Surgery

## 2019-07-15 ENCOUNTER — Other Ambulatory Visit: Payer: Self-pay

## 2019-07-15 ENCOUNTER — Ambulatory Visit: Payer: Managed Care, Other (non HMO) | Admitting: Plastic Surgery

## 2019-07-15 VITALS — BP 158/95 | HR 97 | Temp 97.7°F | Ht 63.0 in | Wt 269.0 lb

## 2019-07-15 DIAGNOSIS — T23062A Burn of unspecified degree of back of left hand, initial encounter: Secondary | ICD-10-CM | POA: Diagnosis not present
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
## 2019-07-19 ENCOUNTER — Other Ambulatory Visit: Payer: Self-pay

## 2019-07-19 ENCOUNTER — Telehealth: Payer: Self-pay

## 2019-07-19 ENCOUNTER — Other Ambulatory Visit: Payer: Self-pay | Admitting: Surgical

## 2019-07-19 ENCOUNTER — Encounter: Payer: Self-pay | Admitting: Internal Medicine

## 2019-07-19 ENCOUNTER — Ambulatory Visit (INDEPENDENT_AMBULATORY_CARE_PROVIDER_SITE_OTHER): Payer: Managed Care, Other (non HMO) | Admitting: Internal Medicine

## 2019-07-19 VITALS — BP 170/128 | HR 92 | Temp 98.2°F | Ht 63.0 in | Wt 254.0 lb

## 2019-07-19 DIAGNOSIS — T23262D Burn of second degree of back of left hand, subsequent encounter: Secondary | ICD-10-CM | POA: Diagnosis not present

## 2019-07-19 MED ORDER — CEPHALEXIN 500 MG PO CAPS: 500.0000 mg | ORAL_CAPSULE | Freq: Three times a day (TID) | ORAL | 0 refills | Status: AC

## 2019-07-19 MED ORDER — HYDROCODONE-ACETAMINOPHEN 5-325 MG PO TABS: 1.0000 | ORAL_TABLET | Freq: Three times a day (TID) | ORAL | 0 refills | Status: AC | PRN

## 2019-07-19 NOTE — Telephone Encounter (Signed)
New order faxed to [facility]- for non-woven gauze dressings & more xeroform & Kerlix wraps for daily dressing changes
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
## 2019-07-19 NOTE — Telephone Encounter (Signed)
Patient to see Dr. Heber [place] in clinic today and speak with Dr. Claudia Desanctis about her progress and update the plan.
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
## 2019-07-19 NOTE — Telephone Encounter (Signed)
Patient reports over the weekend she has noticed a pungent odor from the left hand wound. A blister formed on the middle finger which popped by itself, and the area is now black and hard. She would like a call back to discuss further options for care.

## 2019-07-19 NOTE — Telephone Encounter (Signed)
Patient seen in the office today by Dr. Tacey Ruiz

## 2019-07-22 ENCOUNTER — Telehealth: Payer: Self-pay | Admitting: Plastic Surgery

## 2019-07-22 NOTE — Telephone Encounter (Signed)
Pt lvm requesting additional dressings. Please call her back at [phone]

## 2019-07-23 ENCOUNTER — Telehealth: Payer: Self-pay | Admitting: Plastic Surgery

## 2019-07-23 NOTE — Telephone Encounter (Signed)
Patient called back to advise that she received the 4x4 pads, but received no veriform or tape. Please request these add'l supplies for pt and call her to confirm when ordered.

## 2019-07-23 NOTE — Telephone Encounter (Signed)
Called patient, LMVM. Called [facility], spoke with [place]. The dressings were delivered on 07/20/19.

## 2019-07-23 NOTE — Telephone Encounter (Signed)
Called patient, LMVM explained to her that [facility] sends out enough supplies going by the measurements of the wound. Per [facility], to use Vaseline, gauze and kerlix on the area the xeroform does not cover.

## 2019-07-23 NOTE — Telephone Encounter (Signed)
Pt said she called [facility] as advd and the lady she spoke with said that they could send her more supplies out but it would be for the same amounts that she's getting now. She uses 2 1/2-3 veriform to cover the wound and the amount she is getting from [facility] is not enough to cover her for the full 30 days. A new order may need to be placed with [facility] so that patient gets enough supplies each month because she is running out.

## 2019-07-28 ENCOUNTER — Ambulatory Visit: Payer: Managed Care, Other (non HMO) | Admitting: Surgical

## 2019-07-29 ENCOUNTER — Ambulatory Visit: Payer: Managed Care, Other (non HMO) | Admitting: Surgical

## 2019-07-29 ENCOUNTER — Encounter: Payer: Self-pay | Admitting: Surgical

## 2019-07-29 ENCOUNTER — Encounter: Payer: Self-pay | Admitting: Plastic Surgery

## 2019-07-29 ENCOUNTER — Other Ambulatory Visit: Payer: Self-pay

## 2019-07-29 VITALS — BP 189/124 | HR 94 | Temp 98.0°F | Ht 63.0 in | Wt 258.8 lb

## 2019-07-29 DIAGNOSIS — T23262D Burn of second degree of back of left hand, subsequent encounter: Secondary | ICD-10-CM | POA: Diagnosis not present
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
## 2019-08-06 ENCOUNTER — Other Ambulatory Visit: Payer: Self-pay

## 2019-08-06 ENCOUNTER — Ambulatory Visit: Payer: Managed Care, Other (non HMO) | Attending: Surgical | Admitting: Occupational Therapy

## 2019-08-06 DIAGNOSIS — R600 Localized edema: Secondary | ICD-10-CM | POA: Diagnosis present

## 2019-08-06 DIAGNOSIS — M25632 Stiffness of left wrist, not elsewhere classified: Secondary | ICD-10-CM | POA: Diagnosis present

## 2019-08-06 DIAGNOSIS — M25642 Stiffness of left hand, not elsewhere classified: Secondary | ICD-10-CM | POA: Diagnosis present

## 2019-08-06 DIAGNOSIS — M79642 Pain in left hand: Secondary | ICD-10-CM | POA: Diagnosis present

## 2019-08-06 DIAGNOSIS — M6281 Muscle weakness (generalized): Secondary | ICD-10-CM | POA: Insufficient documentation

## 2019-08-06 DIAGNOSIS — M25532 Pain in left wrist: Secondary | ICD-10-CM
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
## 2019-08-06 NOTE — Patient Instructions (Signed)
MP Flexion (Active Isolated)   Bend __each____ finger at large knuckle, keeping other fingers straight. Do not bend tips. Repeat _10-15___ times. Do __4-6__ sessions per day.  AROM: PIP Flexion / Extension   Pinch bottom knuckle of _____each___ finger of hand to prevent bending. Actively bend middle knuckle until stretch is felt. Hold __5__ seconds. Relax. Straighten finger as far as possible. Repeat __10-15__ times per set. Do _4-6___ sessions per day.   AROM: DIP Flexion / Extension   Pinch middle knuckle of ________ finger of  hand to prevent bending. Bend end knuckle until stretch is felt. Hold _5___ seconds. Relax. Straighten finger as far as possible. Repeat _10-15___ times per set.  Do _4-6___ sessions per day.  AROM: Finger Flexion / Extension   Actively bend fingers of  hand. Start with knuckles furthest from palm, and slowly make a fist. Hold __5__ seconds. Relax. Then straighten fingers as far as possible. Repeat _10-15___ times per set.  Do _4-6___ sessions per day.  Copyright  VHI. All rights reserved.   Flexor Tendon Gliding (Active Hook Fist)   With fingers and knuckles straight, bend middle and tip joints. Do not bend large knuckles. Repeat _10-15___ times. Do _4-6___ sessions per day.  MP Flexion (Active)   With back of hand on table, bend large knuckles as far as they will go, keeping small joints straight. Repeat _10-15___ times. Do __4-6__ sessions per day. Activity: Reach into a narrow container.*      Finger Flexion / Extension   With palm up, bend fingers of left hand toward palm, making a  fist. Straighten fingers, opening fist. Repeat sequence _10-15___ times per session. Do _4-6__ sessions per day. Hand Variation: Palm down   Copyright  VHI. All rights reserved.  Flexor Tendon Gliding (Active Hook Fist)   With fingers and knuckles straight, bend middle and tip joints. Do not bend large knuckles. Repeat _10-15___ times. Do _4-6___  sessions per day.  MP Flexion (Active)   With back of hand on table, bend large knuckles as far as they will go, keeping small joints straight. Repeat _10-15___ times. Do __4-6__ sessions per day. Activity: Reach into a narrow container.*      Finger Flexion / Extension   With palm up, bend fingers of left hand toward palm, making a  fist. Straighten fingers, opening fist. Repeat sequence _10-15___ times per session. Do _4-6__ sessions per day. Hand Variation: Palm down   Copyright  VHI. All rights reserved.   Opposition (Active)   Touch tip of thumb to nail tip of each finger in turn, making an "O" shape. Repeat __10__ times. Do _4-6___ sessions per day.   MP Flexion (Active)   Bend thumb to touch base of little finger, keeping tip joint straight. Repeat __10-15__ times. Do _4-6___ sessions per day.       IP Flexion (Active Blocked)   Brace thumb below tip joint. Bend joint as far as possible. Repeat __10__ times. Do _4-6___ sessions per day.   Composite Extension (Active)   Bring thumb up and out in hitchhiker position.  Repeat __10-15__ times. Do _4-6___ sessions per day.

## 2019-08-09 ENCOUNTER — Ambulatory Visit: Payer: Managed Care, Other (non HMO) | Admitting: Occupational Therapy

## 2019-08-09 ENCOUNTER — Other Ambulatory Visit: Payer: Self-pay

## 2019-08-09 DIAGNOSIS — R600 Localized edema: Secondary | ICD-10-CM

## 2019-08-09 DIAGNOSIS — M79642 Pain in left hand: Secondary | ICD-10-CM

## 2019-08-09 DIAGNOSIS — M25642 Stiffness of left hand, not elsewhere classified: Secondary | ICD-10-CM

## 2019-08-09 DIAGNOSIS — M25532 Pain in left wrist: Secondary | ICD-10-CM
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
## 2019-08-09 NOTE — Patient Instructions (Signed)
Combination Movement (Active)    Keep elbow firmly bent at side with wrist straight. Turn palm up and down movements. Repeat __10__ times. Do _3___ sessions per day.  Wrist Flexion / Extension    Hold elbows at 90 and close to body, with palms down. Bend both wrists so fingers point up. Then bend wrist so fingers point down. Repeat sequence _10___ times per session. Do __3__ sessions per day

## 2019-08-11 ENCOUNTER — Encounter: Payer: Self-pay | Admitting: Occupational Therapy

## 2019-08-11 ENCOUNTER — Ambulatory Visit: Payer: Managed Care, Other (non HMO) | Admitting: Occupational Therapy

## 2019-08-11 ENCOUNTER — Other Ambulatory Visit: Payer: Self-pay

## 2019-08-11 DIAGNOSIS — M79642 Pain in left hand: Secondary | ICD-10-CM | POA: Diagnosis not present

## 2019-08-11 DIAGNOSIS — M25532 Pain in left wrist: Secondary | ICD-10-CM

## 2019-08-11 DIAGNOSIS — M6281 Muscle weakness (generalized): Secondary | ICD-10-CM

## 2019-08-11 DIAGNOSIS — M25632 Stiffness of left wrist, not elsewhere classified: Secondary | ICD-10-CM

## 2019-08-11 DIAGNOSIS — R600 Localized edema: Secondary | ICD-10-CM

## 2019-08-11 DIAGNOSIS — M25642 Stiffness of left hand, not elsewhere classified: Secondary | ICD-10-CM
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
## 2019-08-12 ENCOUNTER — Ambulatory Visit (INDEPENDENT_AMBULATORY_CARE_PROVIDER_SITE_OTHER): Payer: Managed Care, Other (non HMO) | Admitting: Surgical

## 2019-08-12 ENCOUNTER — Encounter: Payer: Self-pay | Admitting: Plastic Surgery

## 2019-08-12 ENCOUNTER — Encounter: Payer: Self-pay | Admitting: Surgical

## 2019-08-12 VITALS — BP 180/98 | HR 74 | Temp 98.2°F | Ht 63.0 in | Wt 261.4 lb

## 2019-08-12 DIAGNOSIS — T23262D Burn of second degree of back of left hand, subsequent encounter: Secondary | ICD-10-CM
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
## 2019-08-18 ENCOUNTER — Telehealth: Payer: Self-pay

## 2019-08-18 ENCOUNTER — Other Ambulatory Visit: Payer: Self-pay

## 2019-08-18 ENCOUNTER — Encounter: Payer: Self-pay | Admitting: Occupational Therapy

## 2019-08-18 ENCOUNTER — Ambulatory Visit: Payer: Managed Care, Other (non HMO) | Admitting: Occupational Therapy

## 2019-08-18 DIAGNOSIS — M79642 Pain in left hand: Secondary | ICD-10-CM | POA: Diagnosis not present

## 2019-08-18 DIAGNOSIS — M25532 Pain in left wrist: Secondary | ICD-10-CM

## 2019-08-18 DIAGNOSIS — M6281 Muscle weakness (generalized): Secondary | ICD-10-CM

## 2019-08-18 DIAGNOSIS — M25632 Stiffness of left wrist, not elsewhere classified: Secondary | ICD-10-CM

## 2019-08-18 DIAGNOSIS — M25642 Stiffness of left hand, not elsewhere classified: Secondary | ICD-10-CM

## 2019-08-18 DIAGNOSIS — R600 Localized edema: Secondary | ICD-10-CM

## 2019-08-18 NOTE — Therapy (Signed)
[facility] [address] [place], [place], [address] Phone: [phone]   Fax:  [phone]  Occupational Therapy Treatment  Patient Details  Name: Annette Deleon MRN: [id number] Date of Birth: 1976-05-11 Referring Provider (OT): Dr. Claudia Desanctis   Encounter Date: 08/18/2019  OT End of Session - 08/18/19 0746    Visit Number  4    Number of Visits  17    Date for OT Re-Evaluation  10/05/19    Authorization Type  [employer]    OT Start Time  0722    OT Stop Time  0802    OT Time Calculation (min)  40 min    Activity Tolerance  Patient tolerated treatment well    Behavior During Therapy  [facility] for tasks assessed/performed       Past Medical History:  Diagnosis Date  . Hypertension   . Migraines   . Obesity   . Pseudotumor cerebri   . Sleep apnea     Past Surgical History:  Procedure Laterality Date  . ARTHROSCOPIC REPAIR ACL    . CESAREAN SECTION    . CHOLECYSTECTOMY    . KNEE SURGERY     right  . MYOMECTOMY      There were no vitals filed for this visit.  Subjective Assessment - 08/18/19 0743    Subjective   only has to wrap hand now with xeroform, not forearm.  wrist popped while pulling out clothes from dryer, but feels better now    Pertinent History  s/p partial thickness burn dorsal Lt hand/forearm on 07/04/19    Currently in Pain?  Yes    Pain Score  --   4-6/10   Pain Location  Hand   and wrist   Pain Orientation  Left    Pain Descriptors / Indicators  Aching;Burning;Tightness    Pain Type  Acute pain    Pain Onset  More than a month ago    Pain Frequency  Intermittent    Aggravating Factors   movement, more at night    Pain Relieving Factors  rest, pain meds       Pt reports 3 new bilsters at base of thumb, 2nd digit and 3rd digit.  Therapist provided stockinette/finger stockinette to cover during session for protection.  Pt reports she is going to call MD as he has drained blisters in the past.  Pt  no longer needs to wrap forearm.    AROM:  Gross composite finger flex/ext, isolated MP flex/ext, isolated IP flex/ext, gross finger adduction/abduction, thumb opposition to each digit and then to base of 5th for finger flex and thumb ext, wrist flex/ext.  Forearm gym for wrist AROM x5.  Followed by self AAROM/PROM and place and holds for composite finger flex (particularly with 2nd digit), thumb composite flexion, wrist flex/ext     OT Education - 08/18/19 0755    Education Details  Yellow putty HEP--see pt instructions (placed putty inside bag due to wearing stockinette today, but can perform at home without bag/stockinette)    Person(s) Educated  Patient    Methods  Explanation;Demonstration;Handout;Verbal cues    Comprehension  Verbalized understanding;Returned demonstration       OT Short Term Goals - 08/18/19 0808      OT SHORT TERM GOAL #1   Title  I with HEP    Time  4    Period  Weeks    Status  Achieved   08/18/19 with initial ROM HEP  Target Date  09/05/19      OT SHORT TERM GOAL #2   Title  Pt will demonstrate at least 80% composite finger flexion and 90% extension in prep for functional use of LUE.    Time  4    Period  Weeks    Status  Achieved   08/11/19:  finger extension WNL, met with finger flex (approx 90% after initial stretching)     OT SHORT TERM GOAL #3   Title  Pt will demonstrate ability to perform finger thumb oposition to all digits in prep for functional use.    Time  4    Period  Weeks    Status  Achieved   08/11/19     OT SHORT TERM GOAL #4   Title  Pt will demonstrate wrist flexion/ extension of at least 50 for LUE in prep for functional use.    Time  4    Period  Weeks    Status  Achieved   08/11/19:  met at this level (50* flex/ext)     OT SHORT TERM GOAL #5   Title  Pt will be I with desensitization techniques, edema contol and splint wear prn    Time  4    Period  Weeks    Status  New        OT Long Term Goals - 08/06/19 1224       OT LONG TERM GOAL #1   Title  I with updated HEP.    Time  8    Period  Weeks    Target Date  10/05/19      OT LONG TERM GOAL #2   Title  Pt will resume use of LUE as a non domiant assist for ADLs/IADLS at least 41% of the time with pain no greater than 3/10.    Time  8    Period  Weeks    Status  New      OT LONG TERM GOAL #3   Title  Pt will demonstrate ability to type at least 20 wpm with 90% accuracy with both hands in prep for work activities.    Time  8    Period  Weeks    Status  New      OT LONG TERM GOAL #4   Title  Pt will demonstrate grip strength of at least 25 lbs in prep for functional use LUE.    Time  8    Period  Weeks    Status  New      OT LONG TERM GOAL #5   Title  Pt will resume cooking independently.    Time  8    Period  Weeks    Status  New            Plan - 08/18/19 0747    Clinical Impression Statement  Pt continues to report moderate pain with ROM and at blisters, but demo improving ROM, particularly after initial stretching    Occupational performance deficits (Please refer to evaluation for details):  ADL's;IADL's;Rest and Sleep;Work;Play;Leisure;Social Participation;Education    Body Structure / Function / Physical Skills  ADL;Edema;Skin integrity;Balance;Endurance;Mobility;Strength;Tone;Flexibility;UE functional use;Pain;FMC;Coordination;Wound;ROM;GMC;Decreased knowledge of precautions;Decreased knowledge of use of DME;Dexterity;IADL;Sensation;Scar mobility    Rehab Potential  Good    OT Frequency  2x / week    OT Duration  8 weeks    OT Treatment/Interventions  Self-care/ADL training;Therapeutic exercise;Splinting;Manual Therapy;Neuromuscular education;Ultrasound;Therapeutic activities;Compression bandaging;DME and/or AE instruction;Paraffin;Cryotherapy;Electrical Stimulation;Fluidtherapy;Scar mobilization;Patient/family education;Passive range of motion;Contrast [employer]  Plan  wrist exercises with 1lb wt?, try typing    Consulted and  Agree with Plan of Care  Patient       Patient will benefit from skilled therapeutic intervention in order to improve the following deficits and impairments:   Body Structure / Function / Physical Skills: ADL, Edema, Skin integrity, Balance, Endurance, Mobility, Strength, Tone, Flexibility, UE functional use, Pain, FMC, Coordination, Wound, ROM, GMC, Decreased knowledge of precautions, Decreased knowledge of use of DME, Dexterity, IADL, Sensation, Scar mobility       Visit Diagnosis: Pain in left hand  Pain in left wrist  Stiffness of left hand, not elsewhere classified  Localized edema  Stiffness of left wrist, not elsewhere classified  Muscle weakness (generalized)    Problem List Patient Active Problem List   Diagnosis Date Noted  . Chest pain, rule out acute myocardial infarction 06/13/2018  . Aortic atherosclerosis ([place])   . Pure hypercholesterolemia   . Near syncope 09/08/2014  . Complicated migraine 60/47/9987  . HTN (hypertension) 11/20/2011  . Pseudotumor cerebri 11/20/2011  . Obesity 11/20/2011    [facility] 08/18/2019, [place] [address] [place], [place], [address] Phone: [phone]   Fax:  [phone]  Name: DONNI OGLESBY MRN: [id number] Date of Birth: [birth year]   Vianne Bulls, OTR/L [facility] [address] [place], [place]  [address] [phone] phone [phone] 08/18/19 8:12 AM

## 2019-08-18 NOTE — Patient Instructions (Addendum)
  1. Grip Strengthening (Resistive Putty)   Squeeze putty using thumb and all fingers. Repeat 15 times. Do 2 sessions per day.  Lateral Pinch Strengthening (Resistive Putty)    Push thumb into putty separately Repeat 10 times. Do 2 sessions per day.    Extension (Assistive Putty)   Roll putty back and forth, being sure to use all fingertips. Repeat 5 times. Do 2 sessions per day.  Then pinch as below.   Palmar Pinch Strengthening (Resistive Putty)   Pinch putty between thumb and each fingertip in turn after rolling out

## 2019-08-18 NOTE — Telephone Encounter (Signed)
Patient reports she was at her OT appointment today, noticed blisters near her wrist/thumb, distal index finger, proximal long finger.  She has not noticed any signs of infection, no fevers, chills.  She reports one of them has already drained on their own.  She is going to come in tomorrow to see Dr. Heber Thornton at 2 PM.

## 2019-08-18 NOTE — Telephone Encounter (Signed)
Patient called to inform us that she has developed blisters in the area where the burn healed. She would like a call back to discuss options for treatment.

## 2019-08-19 ENCOUNTER — Institutional Professional Consult (permissible substitution): Payer: Managed Care, Other (non HMO) | Admitting: Internal Medicine

## 2019-08-19 ENCOUNTER — Ambulatory Visit: Payer: Managed Care, Other (non HMO) | Admitting: Occupational Therapy

## 2019-08-19 ENCOUNTER — Ambulatory Visit (INDEPENDENT_AMBULATORY_CARE_PROVIDER_SITE_OTHER): Payer: Managed Care, Other (non HMO) | Admitting: Internal Medicine

## 2019-08-19 ENCOUNTER — Encounter: Payer: Self-pay | Admitting: Internal Medicine

## 2019-08-19 VITALS — BP 166/111 | HR 97 | Temp 97.7°F | Ht 63.0 in | Wt 261.8 lb

## 2019-08-19 DIAGNOSIS — T23262D Burn of second degree of back of left hand, subsequent encounter: Secondary | ICD-10-CM | POA: Diagnosis not present

## 2019-08-19 NOTE — Progress Notes (Signed)
   Subjective:     Patient ID: Annette Deleon, female    DOB: 28-Apr-1976, 44 y.o.   MRN: [id number]  Chief Complaint  Patient presents with  . Follow-up    for burn on (L) hand blisters    HPI: The patient is a 44 y.o. female here for follow-up of partial thickness burn to the dorsal aspect of her left hand and forearm.  Patient states that in the past week she has had blister formation to her left hand.  She reports 4 blisters however 3 have already popped and drained.  She has 1 blister remaining on the lateral aspect of her 2nd finger.  She denies any purulent drainage from the blisters, fever/chills or worsening of the wound.  She is currently doing OT twice a week and reports doing well with the treatments.  Blood pressure was noted to be high in the office.  Patient states that she is currently adjusting her medications with her PCP.    Review of Systems  All other systems reviewed and are negative.    has a past medical history of Hypertension, Migraines, Obesity, Pseudotumor cerebri, and Sleep apnea.  has a past surgical history that includes Cesarean section; Cholecystectomy; Myomectomy; Knee surgery; and Arthroscopic repair ACL.  reports that she has never smoked. She has never used smokeless tobacco. Objective:   Vital Signs BP (!) 166/111 (BP Location: Right Arm, Patient Position: Sitting, Cuff Size: Large)   Pulse 97   Temp 97.7 F (36.5 C) (Temporal)   Ht 5\' 3"  (1.6 m)   Wt 261 lb 12.8 oz (118.8 kg)   LMP 07/28/2019 (Approximate)   SpO2 98%   BMI 46.38 kg/m  Vital Signs and Nursing Note Reviewed Physical Exam  Skin:  Left 2nd finger wound measurements: 0.8 x0.7cm Serous fluid from wound Blanching noted throughout the wound Sensation intact       Assessment/Plan:     ICD-10-CM   1. Partial thickness burn of back of left hand, subsequent encounter  T23.262D    Assessment: Partial-thickness burn of the dorsal aspect of the left hand and forearm  Patient  was concerned about blister formation on her hand that occured this week.  She had 4 blisters however 3 popped before the visit and have healed well.  In office I debrided the blister on her left second finger with scissors and pickups.  There was serous drainage.  No signs of infection.  Overall the burn on the hand and forearm looks great and is healing well.  The hand was dressed with Xeroform in office.  She was told to continue daily Xeroform dressings on her hand and she can use Vaseline on her forearm.  She has an appointment already scheduled with [facility] and she will follow up with him in a week.  Plan -Xeroform dressing in office to left hand -Sharp debridement of left second finger -Continue Xeroform dressing changes daily on hand, Vaseline Forearm -Follow-up on March 25 with Vanita Panda, DO 08/19/2019, 12:22 PM

## 2019-08-19 NOTE — Patient Instructions (Addendum)
Ms. Lydy,  It was a pleasure seeing you today.  Continue to use Xeroform daily dressing changes on your hand.  You can continue using Vaseline on your forearm.  Please follow-up with Matt on the 25th  Please call us if you have any questions or concerns in the meantime

## 2019-08-20 ENCOUNTER — Other Ambulatory Visit: Payer: Self-pay

## 2019-08-20 ENCOUNTER — Encounter: Payer: Self-pay | Admitting: Occupational Therapy

## 2019-08-20 ENCOUNTER — Ambulatory Visit: Payer: Managed Care, Other (non HMO) | Admitting: Occupational Therapy

## 2019-08-20 DIAGNOSIS — M25532 Pain in left wrist: Secondary | ICD-10-CM

## 2019-08-20 DIAGNOSIS — M6281 Muscle weakness (generalized): Secondary | ICD-10-CM

## 2019-08-20 DIAGNOSIS — M25632 Stiffness of left wrist, not elsewhere classified: Secondary | ICD-10-CM

## 2019-08-20 DIAGNOSIS — M79642 Pain in left hand: Secondary | ICD-10-CM | POA: Diagnosis not present

## 2019-08-20 DIAGNOSIS — R600 Localized edema: Secondary | ICD-10-CM

## 2019-08-20 DIAGNOSIS — M25642 Stiffness of left hand, not elsewhere classified: Secondary | ICD-10-CM

## 2019-08-20 NOTE — Therapy (Signed)
[facility] [address] [place], [place], [address] Phone: [phone]   Fax:  [phone]  Occupational Therapy Treatment  Patient Details  Name: Annette Deleon MRN: [id number] Date of Birth: 05/03/1976 Referring Provider (OT): Dr. Claudia Desanctis   Encounter Date: 08/20/2019  OT End of Session - 08/20/19 0730    Visit Number  5    Number of Visits  17    Date for OT Re-Evaluation  10/05/19    Authorization Type  [employer]    OT Start Time  0719    OT Stop Time  0805    OT Time Calculation (min)  46 min    Activity Tolerance  Patient tolerated treatment well    Behavior During Therapy  [facility] for tasks assessed/performed       Past Medical History:  Diagnosis Date  . Hypertension   . Migraines   . Obesity   . Pseudotumor cerebri   . Sleep apnea     Past Surgical History:  Procedure Laterality Date  . ARTHROSCOPIC REPAIR ACL    . CESAREAN SECTION    . CHOLECYSTECTOMY    . KNEE SURGERY     right  . MYOMECTOMY      There were no vitals filed for this visit.  Subjective Assessment - 08/20/19 0733    Subjective   Pt reports that 2nd digit blister was drained yesterday and hurt more, 3rd digit and base of thumb blisters continue to be present but appear to be resolving.    Pertinent History  s/p partial thickness burn dorsal Lt hand/forearm on 07/04/19    Currently in Pain?  Yes    Pain Score  4     Pain Location  Hand    Pain Orientation  Left    Pain Descriptors / Indicators  Aching;Burning;Tightness    Pain Type  Acute pain    Pain Onset  More than a month ago    Pain Frequency  Intermittent    Aggravating Factors   movement, more at night    Pain Relieving Factors  rest, pain meds        Pt reports that 2nd digit blister drained yesterday, placed finger stockinette over 2nd digit during session today for protection.  AROM:  Gross composite finger flex/ext, isolated MP flex/ext, isolated IP flex/ext, gross  finger adduction/abduction, thumb opposition to each digit and then to base of 5th for finger flex and thumb ext, wrist flex/ext.    Followed by self AAROM/PROM and place and holds for thumb composite flexion, wrist flex/ext  Typing test:  16wpm, 96% accuracy in prep for work tasks.  Pt reports that she typically uses digits 4-5 but did not use these fingers at all today.  Recommend pt begin typing some at home.  Picking up blocks using gripper for sustained grip strength (black spring, level 1) with min difficulty for 1/2 blocks.        OT Education - 08/20/19 0751    Education Details  wrist exercises with 1lb weight--see pt instructions    Person(s) Educated  Patient    Methods  Explanation;Demonstration;Handout;Verbal cues    Comprehension  Verbalized understanding;Returned demonstration       OT Short Term Goals - 08/18/19 0808      OT SHORT TERM GOAL #1   Title  I with HEP    Time  4    Period  Weeks    Status  Achieved   08/18/19 with initial  ROM HEP   Target Date  09/05/19      OT SHORT TERM GOAL #2   Title  Pt will demonstrate at least 80% composite finger flexion and 90% extension in prep for functional use of LUE.    Time  4    Period  Weeks    Status  Achieved   08/11/19:  finger extension WNL, met with finger flex (approx 90% after initial stretching)     OT SHORT TERM GOAL #3   Title  Pt will demonstrate ability to perform finger thumb oposition to all digits in prep for functional use.    Time  4    Period  Weeks    Status  Achieved   08/11/19     OT SHORT TERM GOAL #4   Title  Pt will demonstrate wrist flexion/ extension of at least 50 for LUE in prep for functional use.    Time  4    Period  Weeks    Status  Achieved   08/11/19:  met at this level (50* flex/ext)     OT SHORT TERM GOAL #5   Title  Pt will be I with desensitization techniques, edema contol and splint wear prn    Time  4    Period  Weeks    Status  New        OT Long Term Goals -  08/06/19 1224      OT LONG TERM GOAL #1   Title  I with updated HEP.    Time  8    Period  Weeks    Target Date  10/05/19      OT LONG TERM GOAL #2   Title  Pt will resume use of LUE as a non domiant assist for ADLs/IADLS at least 97% of the time with pain no greater than 3/10.    Time  8    Period  Weeks    Status  New      OT LONG TERM GOAL #3   Title  Pt will demonstrate ability to type at least 20 wpm with 90% accuracy with both hands in prep for work activities.    Time  8    Period  Weeks    Status  New      OT LONG TERM GOAL #4   Title  Pt will demonstrate grip strength of at least 25 lbs in prep for functional use LUE.    Time  8    Period  Weeks    Status  New      OT LONG TERM GOAL #5   Title  Pt will resume cooking independently.    Time  8    Period  Weeks    Status  New            Plan - 08/20/19 0732    Clinical Impression Statement  Pt continues to progress with improving strength and ROM.    Occupational performance deficits (Please refer to evaluation for details):  ADL's;IADL's;Rest and Sleep;Work;Play;Leisure;Social Participation;Education    Body Structure / Function / Physical Skills  ADL;Edema;Skin integrity;Balance;Endurance;Mobility;Strength;Tone;Flexibility;UE functional use;Pain;FMC;Coordination;Wound;ROM;GMC;Decreased knowledge of precautions;Decreased knowledge of use of DME;Dexterity;IADL;Sensation;Scar mobility    Rehab Potential  Good    OT Frequency  2x / week    OT Duration  8 weeks    OT Treatment/Interventions  Self-care/ADL training;Therapeutic exercise;Splinting;Manual Therapy;Neuromuscular education;Ultrasound;Therapeutic activities;Compression bandaging;DME and/or AE instruction;Paraffin;Cryotherapy;Electrical Stimulation;Fluidtherapy;Scar mobilization;Patient/family education;Passive range of motion;Contrast [employer]  continue  with ROM, strength, and LUE functional use.    Consulted and Agree with Plan of Care  Patient        Patient will benefit from skilled therapeutic intervention in order to improve the following deficits and impairments:   Body Structure / Function / Physical Skills: ADL, Edema, Skin integrity, Balance, Endurance, Mobility, Strength, Tone, Flexibility, UE functional use, Pain, FMC, Coordination, Wound, ROM, GMC, Decreased knowledge of precautions, Decreased knowledge of use of DME, Dexterity, IADL, Sensation, Scar mobility       Visit Diagnosis: Pain in left hand  Pain in left wrist  Stiffness of left hand, not elsewhere classified  Localized edema  Stiffness of left wrist, not elsewhere classified  Muscle weakness (generalized)    Problem List Patient Active Problem List   Diagnosis Date Noted  . Chest pain, rule out acute myocardial infarction 06/13/2018  . Aortic atherosclerosis ([place])   . Pure hypercholesterolemia   . Near syncope 09/08/2014  . Complicated migraine 67/34/1937  . HTN (hypertension) 11/20/2011  . Pseudotumor cerebri 11/20/2011  . Obesity 11/20/2011    [facility] 08/20/2019, 8:35 AM  [facility] [address] [place], [place], [address] Phone: [phone]   Fax:  [phone]  Name: Annette Deleon MRN: [id number] Date of Birth: [birth year]   Vianne Bulls, OTR/L [facility] [address] [place], [place]  [address] [phone] phone [phone] 08/20/19 8:35 AM

## 2019-08-20 NOTE — Patient Instructions (Signed)
   Wrist Flexion: Resisted   With right palm up, 1 pound weight in hand, bend wrist up. Return slowly. Repeat 10 times per set.  Do 2 sessions per day.  Wrist Extension: Resisted   With right palm down, 1 pound weight in hand, bend wrist up. Return slowly. Repeat 10 times per set. Do 2 sessions per day.    Radial Deviation (Resistive)    Holding 1lb, bend wrist upward, with thumb pointing toward you. Hold 2 seconds. Repeat 10 times. Do 2 sessions per day. Activity: Use this movement to pick up a cup.*

## 2019-08-24 ENCOUNTER — Ambulatory Visit: Payer: Managed Care, Other (non HMO) | Admitting: Occupational Therapy

## 2019-08-24 ENCOUNTER — Encounter: Payer: Self-pay | Admitting: Occupational Therapy

## 2019-08-24 ENCOUNTER — Other Ambulatory Visit: Payer: Self-pay

## 2019-08-24 DIAGNOSIS — M79642 Pain in left hand: Secondary | ICD-10-CM

## 2019-08-24 DIAGNOSIS — M25532 Pain in left wrist: Secondary | ICD-10-CM

## 2019-08-24 DIAGNOSIS — M25632 Stiffness of left wrist, not elsewhere classified: Secondary | ICD-10-CM

## 2019-08-24 DIAGNOSIS — M25642 Stiffness of left hand, not elsewhere classified: Secondary | ICD-10-CM

## 2019-08-24 DIAGNOSIS — R600 Localized edema: Secondary | ICD-10-CM

## 2019-08-24 DIAGNOSIS — M6281 Muscle weakness (generalized): Secondary | ICD-10-CM

## 2019-08-24 NOTE — Patient Instructions (Signed)
  Typing:  Left-handed Words  1. wear 2. were 3. are 4. bear 5. bare 6. care 7. dear 8. deer 9. stare 10. react 11. dare  12. sad 13. gear 14. great 15. rate  16. date 17. red 18. read 19. fear 20. free 21. tea 22. tear 23. fast 24. waste 25. treat 26. tease 27. zest 28. wax 29. cast 30. vase 31. vast 32. cat 33. basket 34. earn 35. casket 36. bee 37. seat 38. ate 39. gas 40. rare 41. seed 42. tax 43. fax 44. feed 45. cart 46. art 47. tart 48. taste 49. ear 50. here 

## 2019-08-24 NOTE — Therapy (Signed)
[facility] [address] [place], [place], [address] Phone: [phone]   Fax:  [phone]  Occupational Therapy Treatment  Patient Details  Name: Annette Deleon MRN: [id number] Date of Birth: 1975-10-22 Referring Provider (OT): Dr. Claudia Desanctis   Encounter Date: 08/24/2019  OT End of Session - 08/24/19 0727    Visit Number  6    Number of Visits  17    Date for OT Re-Evaluation  10/05/19    Authorization Type  [employer]    OT Start Time  0721    OT Stop Time  0800    OT Time Calculation (min)  39 min    Activity Tolerance  Patient tolerated treatment well    Behavior During Therapy  [facility] for tasks assessed/performed       Past Medical History:  Diagnosis Date  . Hypertension   . Migraines   . Obesity   . Pseudotumor cerebri   . Sleep apnea     Past Surgical History:  Procedure Laterality Date  . ARTHROSCOPIC REPAIR ACL    . CESAREAN SECTION    . CHOLECYSTECTOMY    . KNEE SURGERY     right  . MYOMECTOMY      There were no vitals filed for this visit.  Subjective Assessment - 08/24/19 0726    Subjective   I'm going to talk to the doctor about my wrist    Pertinent History  s/p partial thickness burn dorsal Lt hand/forearm on 07/04/19    Currently in Pain?  Yes    Pain Score  4     Pain Location  Wrist   hand   Pain Orientation  Left    Pain Descriptors / Indicators  Aching;Burning;Tightness    Pain Type  Acute pain    Pain Onset  More than a month ago    Pain Frequency  Intermittent    Aggravating Factors   movement, more at night    Pain Relieving Factors  rest, pain meds         [facility] OT Assessment - 08/24/19 0001      AROM   AROM Assessment Site  Wrist    Right/Left Wrist  Left    Left Wrist Extension  60 Degrees    Left Wrist Flexion  70 Degrees          AROM:  Gross composite finger flex/ext, isolated MP flex/ext, isolated IP flex/ext, gross finger adduction/abduction, thumb opposition  to each digit and then to base of 5th for finger flex and thumb ext, wrist flex/ext, supination/pronation, UD/RD  Followed by self AAROM/PROM and place and holds for wrist flex/ext  Wrist flex/ext, UD/RD, supination/pronation with 1lb weight   Picking up blocks using gripper for sustained grip strength ([place], level 1) with min-mod difficulty    Towel scrunches for finger flex and extension and then thumb adduction/abduction  Typing L-handed words with good accuracy, but reports incr in wrist pain.  Pt to talk to MD about continued wrist pain and dorsal edema at next appt.    OT Education - 08/24/19 0753    Education Details  L-handed typing words    Person(s) Educated  Patient    Methods  Explanation;Demonstration;Handout    Comprehension  Verbalized understanding;Returned demonstration       OT Short Term Goals - 08/24/19 0732      OT SHORT TERM GOAL #1   Title  I with HEP    Time  4  Period  Weeks    Status  Achieved   08/18/19 with initial ROM HEP   Target Date  09/05/19      OT SHORT TERM GOAL #2   Title  Pt will demonstrate at least 80% composite finger flexion and 90% extension in prep for functional use of LUE.    Time  4    Period  Weeks    Status  Achieved   08/11/19:  finger extension WNL, met with finger flex (approx 90% after initial stretching)     OT SHORT TERM GOAL #3   Title  Pt will demonstrate ability to perform finger thumb oposition to all digits in prep for functional use.    Time  4    Period  Weeks    Status  Achieved   08/11/19     OT SHORT TERM GOAL #4   Title  Pt will demonstrate wrist flexion/ extension of at least 50 for LUE in prep for functional use.    Time  4    Period  Weeks    Status  Achieved   08/11/19:  met at this level (50* flex/ext).  08/24/19:  flex 70*, ext 60*     OT SHORT TERM GOAL #5   Title  Pt will be I with desensitization techniques, edema contol and splint wear prn    Time  4    Period  Weeks    Status   New        OT Long Term Goals - 08/06/19 1224      OT LONG TERM GOAL #1   Title  I with updated HEP.    Time  8    Period  Weeks    Target Date  10/05/19      OT LONG TERM GOAL #2   Title  Pt will resume use of LUE as a non domiant assist for ADLs/IADLS at least 09% of the time with pain no greater than 3/10.    Time  8    Period  Weeks    Status  New      OT LONG TERM GOAL #3   Title  Pt will demonstrate ability to type at least 20 wpm with 90% accuracy with both hands in prep for work activities.    Time  8    Period  Weeks    Status  New      OT LONG TERM GOAL #4   Title  Pt will demonstrate grip strength of at least 25 lbs in prep for functional use LUE.    Time  8    Period  Weeks    Status  New      OT LONG TERM GOAL #5   Title  Pt will resume cooking independently.    Time  8    Period  Weeks    Status  New            Plan - 08/24/19 6283    Clinical Impression Statement  Pt continues to progress with improving strength and ROM, and L hand functional use.  Pt can now oppose to base of 5th digit.    Occupational performance deficits (Please refer to evaluation for details):  ADL's;IADL's;Rest and Sleep;Work;Play;Leisure;Social Participation;Education    Body Structure / Function / Physical Skills  ADL;Edema;Skin integrity;Balance;Endurance;Mobility;Strength;Tone;Flexibility;UE functional use;Pain;FMC;Coordination;Wound;ROM;GMC;Decreased knowledge of precautions;Decreased knowledge of use of DME;Dexterity;IADL;Sensation;Scar mobility    Rehab Potential  Good    OT Frequency  2x / week  OT Duration  8 weeks    OT Treatment/Interventions  Self-care/ADL training;Therapeutic exercise;Splinting;Manual Therapy;Neuromuscular education;Ultrasound;Therapeutic activities;Compression bandaging;DME and/or AE instruction;Paraffin;Cryotherapy;Electrical Stimulation;Fluidtherapy;Scar mobilization;Patient/family education;Passive range of motion;Contrast Bath    Plan   continue with ROM, strength, and LUE functional use.    Consulted and Agree with Plan of Care  Patient       Patient will benefit from skilled therapeutic intervention in order to improve the following deficits and impairments:   Body Structure / Function / Physical Skills: ADL, Edema, Skin integrity, Balance, Endurance, Mobility, Strength, Tone, Flexibility, UE functional use, Pain, FMC, Coordination, Wound, ROM, GMC, Decreased knowledge of precautions, Decreased knowledge of use of DME, Dexterity, IADL, Sensation, Scar mobility       Visit Diagnosis: Pain in left hand  Pain in left wrist  Stiffness of left hand, not elsewhere classified  Localized edema  Stiffness of left wrist, not elsewhere classified  Muscle weakness (generalized)    Problem List Patient Active Problem List   Diagnosis Date Noted  . Chest pain, rule out acute myocardial infarction 06/13/2018  . Aortic atherosclerosis (HCC)   . Pure hypercholesterolemia   . Near syncope 09/08/2014  . Complicated migraine 11/21/2011  . HTN (hypertension) 11/20/2011  . Pseudotumor cerebri 11/20/2011  . Obesity 11/20/2011    , 08/24/2019, 7:53 AM  [facility] [address] [place], [place], [address] Phone: [phone]   Fax:  [phone]  Name: Taniaya L Westerfield MRN: [id number] Date of Birth: 03/29/1976     , OTR/L [facility] [address] [place], [place]  [address] [phone] phone [phone] 08/24/19 7:53 AM    

## 2019-08-25 ENCOUNTER — Encounter: Payer: Self-pay | Admitting: Occupational Therapy

## 2019-08-25 ENCOUNTER — Ambulatory Visit: Payer: Managed Care, Other (non HMO) | Admitting: Occupational Therapy

## 2019-08-25 DIAGNOSIS — M25532 Pain in left wrist: Secondary | ICD-10-CM

## 2019-08-25 DIAGNOSIS — M25632 Stiffness of left wrist, not elsewhere classified: Secondary | ICD-10-CM

## 2019-08-25 DIAGNOSIS — M25642 Stiffness of left hand, not elsewhere classified: Secondary | ICD-10-CM

## 2019-08-25 DIAGNOSIS — M79642 Pain in left hand: Secondary | ICD-10-CM | POA: Diagnosis not present

## 2019-08-25 DIAGNOSIS — R600 Localized edema: Secondary | ICD-10-CM

## 2019-08-25 DIAGNOSIS — M6281 Muscle weakness (generalized): Secondary | ICD-10-CM

## 2019-08-25 NOTE — Therapy (Signed)
[facility] [address] [place], [place], [address] Phone: [phone]   Fax:  [phone]  Occupational Therapy Treatment  Patient Details  Name: Annette Deleon MRN: [id number] Date of Birth: 1975-09-29 Referring Provider (OT): Dr. Claudia Desanctis   Encounter Date: 08/25/2019  OT End of Session - 08/25/19 0729    Visit Number  7    Number of Visits  17    Date for OT Re-Evaluation  10/05/19    Authorization Type  [employer]    OT Start Time  9233   arrived late   OT Stop Time  0804    OT Time Calculation (min)  38 min    Activity Tolerance  Patient tolerated treatment well    Behavior During Therapy  [facility] for tasks assessed/performed       Past Medical History:  Diagnosis Date  . Hypertension   . Migraines   . Obesity   . Pseudotumor cerebri   . Sleep apnea     Past Surgical History:  Procedure Laterality Date  . ARTHROSCOPIC REPAIR ACL    . CESAREAN SECTION    . CHOLECYSTECTOMY    . KNEE SURGERY     right  . MYOMECTOMY      There were no vitals filed for this visit.  Subjective Assessment - 08/25/19 0728    Subjective   pain is about the same    Pertinent History  s/p partial thickness burn dorsal Lt hand/forearm on 07/04/19    Currently in Pain?  Yes    Pain Score  4     Pain Location  Wrist    Pain Orientation  Left    Pain Descriptors / Indicators  Aching;Burning;Tightness    Pain Type  Acute pain    Pain Onset  More than a month ago    Pain Frequency  Intermittent    Aggravating Factors   movement, more at night    Pain Relieving Factors  rest, pain meds         [facility] OT Assessment - 08/25/19 0001      Hand Function   Right Hand Grip (lbs)  66.5    Left Hand Grip (lbs)  15.2        Previous blisters at base of thumb, 2nd and 3rd digits are scabbed and resolving with no signs of infection.  Pt also reports 1 small new blister at tip of thumb dorsally.     AROM:  Gross composite finger  flex/ext, isolated MP flex/ext, isolated IP flex/ext, gross finger adduction/abduction, thumb flexion to base of 5th for finger flex and thumb ext, wrist flex/ext, supination/pronation, UD/RD  Followed by self AAROM/PROM and place and holds for wrist flex/ext  Wrist flex/ext, UD/RD, supination/pronation with 1lb weight x15 each  Functional reaching to place/remove clothespins with 1-8lb resistance on vertical pole for incr strength.  Pain 5/10.  Towel scrunches for finger flex and extension and then thumb adduction/abduction  Picking up checkers and manipulating 3 in hand for incr coordination/functional use with no significant difficulty.     OT Short Term Goals - 08/24/19 0732      OT SHORT TERM GOAL #1   Title  I with HEP    Time  4    Period  Weeks    Status  Achieved   08/18/19 with initial ROM HEP   Target Date  09/05/19      OT SHORT TERM GOAL #2   Title  Pt will  demonstrate at least 80% composite finger flexion and 90% extension in prep for functional use of LUE.    Time  4    Period  Weeks    Status  Achieved   08/11/19:  finger extension WNL, met with finger flex (approx 90% after initial stretching)     OT SHORT TERM GOAL #3   Title  Pt will demonstrate ability to perform finger thumb oposition to all digits in prep for functional use.    Time  4    Period  Weeks    Status  Achieved   08/11/19     OT SHORT TERM GOAL #4   Title  Pt will demonstrate wrist flexion/ extension of at least 50 for LUE in prep for functional use.    Time  4    Period  Weeks    Status  Achieved   08/11/19:  met at this level (50* flex/ext).  08/24/19:  flex 70*, ext 60*     OT SHORT TERM GOAL #5   Title  Pt will be I with desensitization techniques, edema contol and splint wear prn    Time  4    Period  Weeks    Status  New        OT Long Term Goals - 08/06/19 1224      OT LONG TERM GOAL #1   Title  I with updated HEP.    Time  8    Period  Weeks    Target Date  10/05/19       OT LONG TERM GOAL #2   Title  Pt will resume use of LUE as a non domiant assist for ADLs/IADLS at least 31% of the time with pain no greater than 3/10.    Time  8    Period  Weeks    Status  New      OT LONG TERM GOAL #3   Title  Pt will demonstrate ability to type at least 20 wpm with 90% accuracy with both hands in prep for work activities.    Time  8    Period  Weeks    Status  New      OT LONG TERM GOAL #4   Title  Pt will demonstrate grip strength of at least 25 lbs in prep for functional use LUE.    Time  8    Period  Weeks    Status  New      OT LONG TERM GOAL #5   Title  Pt will resume cooking independently.    Time  8    Period  Weeks    Status  New            Plan - 08/25/19 0730    Clinical Impression Statement  Pt continues to progress with ROM and LUE functional use.  Wrist pain and fatigue continues to be a barrier.    Occupational performance deficits (Please refer to evaluation for details):  ADL's;IADL's;Rest and Sleep;Work;Play;Leisure;Social Participation;Education    Body Structure / Function / Physical Skills  ADL;Edema;Skin integrity;Balance;Endurance;Mobility;Strength;Tone;Flexibility;UE functional use;Pain;FMC;Coordination;Wound;ROM;GMC;Decreased knowledge of precautions;Decreased knowledge of use of DME;Dexterity;IADL;Sensation;Scar mobility    Rehab Potential  Good    OT Frequency  2x / week    OT Duration  8 weeks    OT Treatment/Interventions  Self-care/ADL training;Therapeutic exercise;Splinting;Manual Therapy;Neuromuscular education;Ultrasound;Therapeutic activities;Compression bandaging;DME and/or AE instruction;Paraffin;Cryotherapy;Electrical Stimulation;Fluidtherapy;Scar mobilization;Patient/family education;Passive range of motion;Contrast Bath    Plan  continue with ROM, strength, and LUE functional  use, update putty HEP to red, ?desensitization/edema control prn    Consulted and Agree with Plan of Care  Patient       Patient will  benefit from skilled therapeutic intervention in order to improve the following deficits and impairments:   Body Structure / Function / Physical Skills: ADL, Edema, Skin integrity, Balance, Endurance, Mobility, Strength, Tone, Flexibility, UE functional use, Pain, FMC, Coordination, Wound, ROM, GMC, Decreased knowledge of precautions, Decreased knowledge of use of DME, Dexterity, IADL, Sensation, Scar mobility       Visit Diagnosis: Pain in left hand  Pain in left wrist  Stiffness of left hand, not elsewhere classified  Localized edema  Stiffness of left wrist, not elsewhere classified  Muscle weakness (generalized)    Problem List Patient Active Problem List   Diagnosis Date Noted  . Chest pain, rule out acute myocardial infarction 06/13/2018  . Aortic atherosclerosis ([place])   . Pure hypercholesterolemia   . Near syncope 09/08/2014  . Complicated migraine 55/21/7471  . HTN (hypertension) 11/20/2011  . Pseudotumor cerebri 11/20/2011  . Obesity 11/20/2011    [facility] 08/25/2019, 8:44 AM  [facility] [address] [place], [place], [address] Phone: [phone]   Fax:  [phone]  Name: Annette Deleon MRN: [id number] Date of Birth: [birth year]    Vianne Bulls, OTR/L [facility] [address] [place], [place]  [address] [phone] phone [phone] 08/25/19 8:44 AM

## 2019-08-26 ENCOUNTER — Encounter: Payer: Self-pay | Admitting: Surgical

## 2019-08-26 ENCOUNTER — Other Ambulatory Visit: Payer: Self-pay

## 2019-08-26 ENCOUNTER — Ambulatory Visit (INDEPENDENT_AMBULATORY_CARE_PROVIDER_SITE_OTHER): Payer: Managed Care, Other (non HMO) | Admitting: Surgical

## 2019-08-26 ENCOUNTER — Encounter: Payer: Self-pay | Admitting: Plastic Surgery

## 2019-08-26 VITALS — BP 176/104 | HR 85 | Temp 97.5°F | Ht 63.0 in | Wt 261.0 lb

## 2019-08-26 DIAGNOSIS — T23062A Burn of unspecified degree of back of left hand, initial encounter: Secondary | ICD-10-CM | POA: Diagnosis not present

## 2019-08-26 DIAGNOSIS — T23262D Burn of second degree of back of left hand, subsequent encounter: Secondary | ICD-10-CM | POA: Diagnosis not present

## 2019-08-26 NOTE — Progress Notes (Signed)
Subjective:     Patient ID: Annette Deleon, female    DOB: January 21, 1976, 43 y.o.   MRN: [id number]  Chief Complaint  Patient presents with  . Follow-up    2 weeks for burn on back of (L) hand   HPI: The patient is a 44 y.o. female here for follow-up on her left hand/forearm burn.  Her initial burn occurred on 07/04/2019 and she presented to the ED for evaluation.  She was first evaluated in this office on 07/05/2019 by Dr. Claudia Desanctis.  She has underwent multiple debridements in the office.  She has been doing Vaseline to her forearm daily as well as Xeroform and Kerlix wraps to her left hand.  She has also been attending OT.  Per OT note yesterday, 08/25/2019 patient has achieved the majority of her short short-term goals including at least 80% composite finger flexion and 90% extension.  She also demonstrated the ability to perform finger to thumb opposition to all digits and prep for functional use.  At this time they are currently working on desensitization techniques, edema control and splint wearing as needed.  Today she reports that she is having some pain in her left wrist.  She is unsure what is causing it, possibly due to excessive use.  She has been seeing OT frequently as well as doing exercises at home.  She reports some shooting pain along the left lateral forearm, consistent with the distribution of the ulnar nerve.  She also reports some swelling of her left wrist.  She denies any trauma.  No obvious deformity noted.  She has been applying Vaseline to forearm, which is healing nicely and has good color.  She has previously been applying Xeroform to left hand wound.  She endorses some small blisters that have developed.  She has one today that is pinpoint.  Review of Systems  Constitutional: Positive for activity change.  Musculoskeletal: Positive for arthralgias and myalgias.  Skin: Positive for color change. Negative for wound.  Neurological: Positive for numbness.       + sensory  change   Psychiatric/Behavioral: Dysphoric mood:      Objective:   Vital Signs BP (!) 176/104 (BP Location: Right Arm, Patient Position: Sitting, Cuff Size: Large)   Pulse 85   Temp (!) 97.5 F (36.4 C) (Temporal)   Ht 5' 3" (1.6 m)   Wt 261 lb (118.4 kg)   LMP 08/24/2019   SpO2 99%   BMI 46.23 kg/m  Vital Signs and Nursing Note Reviewed  Physical Exam  Constitutional: She is oriented to person, place, and time and well-developed, well-nourished, and in no distress.  HENT:  Head: Normocephalic and atraumatic.  Cardiovascular: Normal rate.  Pulmonary/Chest: Effort normal.  Musculoskeletal:        General: Tenderness (Left wrist) and edema (Mild swelling of L wrist) present.       Hands:  Neurological: She is alert and oriented to person, place, and time.  Skin: Skin is warm and dry. She is not diaphoretic.  Psychiatric: Mood and affect normal.      Assessment/Plan:     ICD-10-CM   1. Partial thickness burn of back of left hand, subsequent encounter  T23.262D   2. Burn of back of left hand, unspecified burn degree, initial encounter  T23.062A     Mrs. Miles is a 44 year old female with a burn to her left hand and forearm.  She has progressed nicely, on exam today it appears that the majority if not  all of her burn has completely epithelialized.   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 Per OT notes patient has met all of her short-term goals excluding a new goal for desensitization.  I think returning to work would be beneficial for her to improve function and prevent deconditioning.  I will have staff provide her with a return to work note, return to work on Monday, 08/30/2019 and work half days for 1 week then return full-time starting for 09/06/19.  Recommend applying Vaseline to entire arm, she no longer has to use Xeroform.  Recommend calling with any questions or concerns.  In regards to the left  lateral forearm pain, suspect possibly due to overuse, recommend naproxen for 1 week and she can also ice intermittently.  If icing helps, she can continue with that.  If it does not help, continue only with naproxen.  Patient cannot tolerate ibuprofen, causes nausea, upset stomach.  If left wrist pain continues, may need further evaluation via x-ray or nerve conduction test.  I would like to see her back in 1 month.       Carola Rhine Altha Sweitzer, PA-C 08/26/2019, 2:11 PM

## 2019-08-31 ENCOUNTER — Ambulatory Visit: Payer: Managed Care, Other (non HMO) | Admitting: Occupational Therapy

## 2019-09-06 ENCOUNTER — Ambulatory Visit: Payer: Managed Care, Other (non HMO) | Attending: Surgical | Admitting: Occupational Therapy

## 2019-09-06 DIAGNOSIS — M25632 Stiffness of left wrist, not elsewhere classified: Secondary | ICD-10-CM | POA: Insufficient documentation

## 2019-09-06 DIAGNOSIS — M25642 Stiffness of left hand, not elsewhere classified: Secondary | ICD-10-CM | POA: Insufficient documentation

## 2019-09-06 DIAGNOSIS — M79642 Pain in left hand: Secondary | ICD-10-CM | POA: Insufficient documentation

## 2019-09-06 DIAGNOSIS — M25532 Pain in left wrist: Secondary | ICD-10-CM | POA: Insufficient documentation

## 2019-09-06 DIAGNOSIS — M6281 Muscle weakness (generalized): Secondary | ICD-10-CM | POA: Insufficient documentation

## 2019-09-06 DIAGNOSIS — R600 Localized edema: Secondary | ICD-10-CM | POA: Insufficient documentation

## 2019-09-07 ENCOUNTER — Telehealth: Payer: Self-pay | Admitting: Plastic Surgery

## 2019-09-07 NOTE — Telephone Encounter (Signed)
Patient called to request medical records for all visits with our practice to submit to accidental insurance plan. She will come pick the notes up personally.

## 2019-09-08 ENCOUNTER — Ambulatory Visit: Payer: Managed Care, Other (non HMO) | Admitting: Occupational Therapy

## 2019-09-08 ENCOUNTER — Other Ambulatory Visit: Payer: Self-pay

## 2019-09-08 ENCOUNTER — Encounter: Payer: Self-pay | Admitting: Occupational Therapy

## 2019-09-08 ENCOUNTER — Encounter: Payer: Managed Care, Other (non HMO) | Admitting: Occupational Therapy

## 2019-09-08 DIAGNOSIS — M25532 Pain in left wrist: Secondary | ICD-10-CM

## 2019-09-08 DIAGNOSIS — M25642 Stiffness of left hand, not elsewhere classified: Secondary | ICD-10-CM | POA: Diagnosis present

## 2019-09-08 DIAGNOSIS — M79642 Pain in left hand: Secondary | ICD-10-CM

## 2019-09-08 DIAGNOSIS — M6281 Muscle weakness (generalized): Secondary | ICD-10-CM | POA: Diagnosis present

## 2019-09-08 DIAGNOSIS — R600 Localized edema: Secondary | ICD-10-CM | POA: Diagnosis present

## 2019-09-08 DIAGNOSIS — M25632 Stiffness of left wrist, not elsewhere classified: Secondary | ICD-10-CM | POA: Diagnosis present

## 2019-09-08 NOTE — Patient Instructions (Signed)
     Pronation / Supination (Resistive)   Hold hammer weighing and rotate palm up and down. Keep elbow flexed at side and wrist straight. Hold 5sec each direction Repeat 10 times. Do 2 sessions per day.
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
## 2019-09-14 ENCOUNTER — Encounter: Payer: Managed Care, Other (non HMO) | Admitting: Occupational Therapy

## 2019-09-14 ENCOUNTER — Ambulatory Visit: Payer: Managed Care, Other (non HMO) | Admitting: Occupational Therapy

## 2019-09-16 ENCOUNTER — Encounter: Payer: Managed Care, Other (non HMO) | Admitting: Occupational Therapy

## 2019-09-21 ENCOUNTER — Other Ambulatory Visit: Payer: Self-pay

## 2019-09-21 ENCOUNTER — Encounter: Payer: Self-pay | Admitting: Occupational Therapy

## 2019-09-21 ENCOUNTER — Ambulatory Visit: Payer: Managed Care, Other (non HMO) | Admitting: Occupational Therapy

## 2019-09-21 DIAGNOSIS — M6281 Muscle weakness (generalized): Secondary | ICD-10-CM

## 2019-09-21 DIAGNOSIS — M25632 Stiffness of left wrist, not elsewhere classified: Secondary | ICD-10-CM

## 2019-09-21 DIAGNOSIS — M79642 Pain in left hand: Secondary | ICD-10-CM

## 2019-09-21 DIAGNOSIS — M25532 Pain in left wrist: Secondary | ICD-10-CM

## 2019-09-21 DIAGNOSIS — M25642 Stiffness of left hand, not elsewhere classified: Secondary | ICD-10-CM

## 2019-09-21 DIAGNOSIS — R600 Localized edema: Secondary | ICD-10-CM
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
## 2019-09-24 ENCOUNTER — Other Ambulatory Visit: Payer: Self-pay

## 2019-09-24 ENCOUNTER — Ambulatory Visit: Payer: Managed Care, Other (non HMO) | Admitting: Occupational Therapy

## 2019-09-24 ENCOUNTER — Encounter: Payer: Self-pay | Admitting: Occupational Therapy

## 2019-09-24 DIAGNOSIS — M25642 Stiffness of left hand, not elsewhere classified: Secondary | ICD-10-CM

## 2019-09-24 DIAGNOSIS — M6281 Muscle weakness (generalized): Secondary | ICD-10-CM

## 2019-09-24 DIAGNOSIS — M79642 Pain in left hand: Secondary | ICD-10-CM | POA: Diagnosis not present

## 2019-09-24 DIAGNOSIS — R600 Localized edema: Secondary | ICD-10-CM

## 2019-09-24 DIAGNOSIS — M25632 Stiffness of left wrist, not elsewhere classified: Secondary | ICD-10-CM

## 2019-09-24 DIAGNOSIS — M25532 Pain in left wrist: Secondary | ICD-10-CM
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
## 2019-09-28 ENCOUNTER — Encounter: Payer: Self-pay | Admitting: Plastic Surgery

## 2019-09-28 ENCOUNTER — Encounter: Payer: Self-pay | Admitting: Surgical

## 2019-09-28 ENCOUNTER — Ambulatory Visit (INDEPENDENT_AMBULATORY_CARE_PROVIDER_SITE_OTHER): Payer: Managed Care, Other (non HMO) | Admitting: Surgical

## 2019-09-28 ENCOUNTER — Encounter: Payer: Self-pay | Admitting: Neurology

## 2019-09-28 ENCOUNTER — Other Ambulatory Visit: Payer: Self-pay

## 2019-09-28 VITALS — BP 161/64 | HR 82 | Temp 97.9°F | Ht 63.0 in | Wt 265.4 lb

## 2019-09-28 DIAGNOSIS — T23262D Burn of second degree of back of left hand, subsequent encounter: Secondary | ICD-10-CM

## 2019-09-28 DIAGNOSIS — M792 Neuralgia and neuritis, unspecified: Secondary | ICD-10-CM
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
## 2019-09-30 ENCOUNTER — Other Ambulatory Visit: Payer: Self-pay

## 2019-09-30 ENCOUNTER — Ambulatory Visit: Payer: Managed Care, Other (non HMO) | Admitting: Occupational Therapy

## 2019-09-30 DIAGNOSIS — M25642 Stiffness of left hand, not elsewhere classified: Secondary | ICD-10-CM

## 2019-09-30 DIAGNOSIS — M79642 Pain in left hand: Secondary | ICD-10-CM

## 2019-09-30 DIAGNOSIS — M25532 Pain in left wrist: Secondary | ICD-10-CM
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
## 2019-10-05 ENCOUNTER — Ambulatory Visit: Payer: Managed Care, Other (non HMO) | Admitting: Occupational Therapy
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
## 2019-12-15 ENCOUNTER — Ambulatory Visit: Payer: Managed Care, Other (non HMO) | Admitting: Neurology

## 2019-12-20 ENCOUNTER — Encounter: Payer: Self-pay | Admitting: Occupational Therapy
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
## 2020-03-28 ENCOUNTER — Ambulatory Visit: Payer: Managed Care, Other (non HMO) | Admitting: Surgical

## 2020-03-31 ENCOUNTER — Ambulatory Visit: Payer: Managed Care, Other (non HMO) | Admitting: Surgical

## 2020-04-13 ENCOUNTER — Ambulatory Visit: Payer: Managed Care, Other (non HMO) | Admitting: Plastic Surgery

## 2020-04-14 ENCOUNTER — Ambulatory Visit: Payer: Managed Care, Other (non HMO) | Admitting: Surgical

## 2020-06-11 ENCOUNTER — Emergency Department (HOSPITAL_COMMUNITY): Payer: Managed Care, Other (non HMO)

## 2020-06-11 ENCOUNTER — Encounter (HOSPITAL_COMMUNITY): Payer: Self-pay

## 2020-06-11 ENCOUNTER — Other Ambulatory Visit: Payer: Self-pay

## 2020-06-11 ENCOUNTER — Emergency Department (HOSPITAL_COMMUNITY)
Admission: EM | Admit: 2020-06-11 | Discharge: 2020-06-11 | Disposition: A | Discharge status: Home / Self Care | Payer: Managed Care, Other (non HMO) | Attending: Emergency Medicine | Admitting: Emergency Medicine

## 2020-06-11 DIAGNOSIS — I160 Hypertensive urgency: Secondary | ICD-10-CM

## 2020-06-11 DIAGNOSIS — R519 Headache, unspecified: Secondary | ICD-10-CM | POA: Insufficient documentation

## 2020-06-11 DIAGNOSIS — R110 Nausea: Secondary | ICD-10-CM | POA: Insufficient documentation

## 2020-06-11 DIAGNOSIS — G932 Benign intracranial hypertension: Secondary | ICD-10-CM | POA: Diagnosis not present

## 2020-06-11 DIAGNOSIS — I10 Essential (primary) hypertension: Secondary | ICD-10-CM | POA: Insufficient documentation

## 2020-06-11 LAB — COMPREHENSIVE METABOLIC PANEL
ALT: 15 U/L (ref 0–44)
AST: 17 U/L (ref 15–41)
Albumin: 3.6 g/dL (ref 3.5–5.0)
Alkaline Phosphatase: 70 U/L (ref 38–126)
Anion gap: 12 (ref 5–15)
BUN: 9 mg/dL (ref 6–20)
CO2: 21 mmol/L — ABNORMAL LOW (ref 22–32)
Calcium: 9 mg/dL (ref 8.9–10.3)
Chloride: 103 mmol/L (ref 98–111)
Creatinine, Ser: 1.06 mg/dL — ABNORMAL HIGH (ref 0.44–1.00)
GFR, Estimated: >60 mL/min (ref >60)
Glucose, Bld: 100 mg/dL — ABNORMAL HIGH (ref 70–99)
Potassium: 3.5 mmol/L (ref 3.5–5.1)
Sodium: 136 mmol/L (ref 135–145)
Total Bilirubin: 0.4 mg/dL (ref 0.3–1.2)
Total Protein: 7.1 g/dL (ref 6.5–8.1)

## 2020-06-11 LAB — CBC
HCT: 38.9 % (ref 36.0–46.0)
Hemoglobin: 12.6 g/dL (ref 12.0–15.0)
MCH: 26 pg (ref 26.0–34.0)
MCHC: 32.4 g/dL (ref 30.0–36.0)
MCV: 80.4 fL (ref 80.0–100.0)
Platelets: 420 10*3/uL — ABNORMAL HIGH (ref 150–400)
RBC: 4.84 MIL/uL (ref 3.87–5.11)
RDW: 14.2 % (ref 11.5–15.5)
WBC: 9.8 10*3/uL (ref 4.0–10.5)
nRBC: 0 % (ref 0.0–0.2)

## 2020-06-11 LAB — I-STAT CHEM 8, ED
BUN: 10 mg/dL (ref 6–20)
Calcium, Ion: 1.07 mmol/L — ABNORMAL LOW (ref 1.15–1.40)
Chloride: 105 mmol/L (ref 98–111)
Creatinine, Ser: 1 mg/dL (ref 0.44–1.00)
Glucose, Bld: 97 mg/dL (ref 70–99)
HCT: 38 % (ref 36.0–46.0)
Hemoglobin: 12.9 g/dL (ref 12.0–15.0)
Potassium: 3.5 mmol/L (ref 3.5–5.1)
Sodium: 138 mmol/L (ref 135–145)
TCO2: 22 mmol/L (ref 22–32)

## 2020-06-11 LAB — DIFFERENTIAL
Abs Immature Granulocytes: 0.03 10*3/uL (ref 0.00–0.07)
Basophils Absolute: 0.1 10*3/uL (ref 0.0–0.1)
Basophils Relative: 1 %
Eosinophils Absolute: 0.2 10*3/uL (ref 0.0–0.5)
Eosinophils Relative: 2 %
Immature Granulocytes: 0 %
Lymphocytes Relative: 19 %
Lymphs Abs: 1.9 10*3/uL (ref 0.7–4.0)
Monocytes Absolute: 0.8 10*3/uL (ref 0.1–1.0)
Monocytes Relative: 9 %
Neutro Abs: 6.8 10*3/uL (ref 1.7–7.7)
Neutrophils Relative %: 69 %

## 2020-06-11 LAB — APTT: aPTT: 30 seconds (ref 24–36)

## 2020-06-11 LAB — I-STAT BETA HCG BLOOD, ED (MC, WL, AP ONLY)
I-stat hCG, quantitative: <5 m[IU]/mL (ref <5)
I-stat hCG, quantitative: <5 m[IU]/mL (ref <5)

## 2020-06-11 LAB — PROTIME-INR
INR: 1.1 (ref 0.8–1.2)
Prothrombin Time: 13.4 seconds (ref 11.4–15.2)

## 2020-06-11 LAB — CBG MONITORING, ED: Glucose-Capillary: 85 mg/dL (ref 70–99)

## 2020-06-11 MED ORDER — SODIUM CHLORIDE 0.9 % IV BOLUS
500.0000 mL | Freq: Once | INTRAVENOUS | Status: AC
  Administered 2020-06-11: 500 mL via INTRAVENOUS

## 2020-06-11 MED ORDER — SODIUM CHLORIDE 0.9% FLUSH
3.0000 mL | Freq: Once | INTRAVENOUS | Status: AC
Start: 2020-06-11 — End: 2020-06-11
  Administered 2020-06-11: 3 mL via INTRAVENOUS

## 2020-06-11 MED ORDER — ONDANSETRON HCL 4 MG/2ML IJ SOLN
INTRAMUSCULAR | Status: AC
  Administered 2020-06-11: 4 mg via INTRAVENOUS
  Filled 2020-06-11: qty 2

## 2020-06-11 MED ORDER — IOHEXOL 350 MG/ML SOLN
75.0000 mL | Freq: Once | INTRAVENOUS | Status: AC | PRN
  Administered 2020-06-11: 75 mL via INTRAVENOUS

## 2020-06-11 MED ORDER — ONDANSETRON HCL 4 MG/2ML IJ SOLN: 4.0000 mg | Freq: Once | INTRAMUSCULAR | Status: AC

## 2020-06-11 MED ORDER — DIPHENHYDRAMINE HCL 50 MG/ML IJ SOLN
25.0000 mg | Freq: Once | INTRAMUSCULAR | Status: AC
  Administered 2020-06-11: 25 mg via INTRAVENOUS
  Filled 2020-06-11: qty 1

## 2020-06-11 MED ORDER — PROCHLORPERAZINE EDISYLATE 10 MG/2ML IJ SOLN
10.0000 mg | Freq: Once | INTRAMUSCULAR | Status: AC
  Administered 2020-06-11: 10 mg via INTRAVENOUS
  Filled 2020-06-11: qty 2

## 2020-06-11 NOTE — Discharge Instructions (Addendum)
Your work-up today including CT imaging of the head was reassuring and without evidence of stroke or other acute pathology.    Your symptoms today were likely suggestive of complex migraine.  You need to have close follow-up with your neurologist, Dr. Merlene Laughter, for ongoing evaluation and management.  Please return to the ED or seek immediate medical attention should you experience any new or worsening symptoms.  -------- At this time there does not appear to be the presence of an emergent medical condition, however there is always the potential for conditions to change. Please read and follow the below instructions.  Please return to the Emergency Department immediately for any new or worsening symptoms or if your symptoms return.   Please read the additional information packets attached to your discharge summary.  Do not take your medicine if  develop an itchy rash, swelling in your mouth or lips, or difficulty breathing; call 911 and seek immediate emergency medical attention if this occurs.  You may review your lab tests and imaging results in their entirety on your MyChart account.  Please discuss all results of fully with your primary care provider and other specialist at your follow-up visit.  Note: Portions of this text may have been transcribed using voice recognition software. Every effort was made to ensure accuracy; however, inadvertent computerized transcription errors may still be present.

## 2020-06-11 NOTE — ED Provider Notes (Signed)
Care handoff received from Krista Blue, PA-C at shift change please see previous prior note for full details of visit.  In short patient is a 45 year old female arrived as a code stroke today for left-sided facial numbness, left-sided headache, left-sided blurred vision and left extremity numbness/weakness.  Patient was not a TPA candidate, CT imaging including CT angios were performed and were reassuring.  Patient was then evaluated by neurology, neurology believes this could be hypertensive versus migraine versus pseudotumor.  There is no indication for admission they recommended symptomatic treatment and BP control here in the ED.  Neurology recommends that if headache improves patient can be discharged home with close neurology follow-up.  If headache persists then could be admitted to the hospital for MRI/LP. Physical Exam  BP 134/83   Pulse 80   Temp 98 F (36.7 C) (Oral)   Resp (!) 29   Ht 5\' 3"  (1.6 m)   Wt 127.2 kg   SpO2 100%   BMI 49.68 kg/m   Physical Exam Constitutional:      General: She is not in acute distress.    Appearance: Normal appearance. She is well-developed. She is not ill-appearing or diaphoretic.  HENT:     Head: Normocephalic and atraumatic.  Eyes:     General: Vision grossly intact. Gaze aligned appropriately.     Pupils: Pupils are equal, round, and reactive to light.  Neck:     Trachea: Trachea and phonation normal.  Pulmonary:     Effort: Pulmonary effort is normal. No respiratory distress.  Abdominal:     General: There is no distension.     Palpations: Abdomen is soft.     Tenderness: There is no abdominal tenderness. There is no guarding or rebound.  Musculoskeletal:        General: Normal range of motion.     Cervical back: Normal range of motion.  Skin:    General: Skin is warm and dry.  Neurological:     Mental Status: She is alert.     GCS: GCS eye subscore is 4. GCS verbal subscore is 5. GCS motor subscore is 6.     Comments: Speech is  clear and goal oriented, follows commands  Psychiatric:        Behavior: Behavior normal.     ED Course/Procedures     Procedures  MDM  EKG: Vent. rate 77 BPM PR interval * ms QRS duration 96 ms QT/QTc 377/427 ms P-R-T axes 38 72 -61Sinus rhythm Anteroseptal infarct, old Abnormal T, consider ischemia, diffuse leads When cmpared to prior, similar apperance. No STEMI Confirmed by Antony Blackbird [phone]) on 06/11/2020 6:12:52 PM  CTA Head/Neck:  IMPRESSION:  1. No large or medium vessel occlusion.  2. Minimal atherosclerotic change at both carotid bifurcations but  without stenosis. Tortuous vessels suggesting a history of  hypertension.  3. These results were communicated to Xu at 3:38 pmon 1/9/2022by  text page via the [facility] messaging system.   CBC without leukocytosis or anemia.  CBG within normal limits.  CMP without emergent electrolyte derangement, AKI, LFT elevations or gap.  PT/INR and APTT within normal limits.  Pregnancy test negative.  6:05 PM: I have reevaluated this patient she is resting comfortably in bed no acute distress family member at bedside.  Speech clear.  Blood pressure within normal limits.  Patient is requesting to be discharged she reports she is feeling much better and would like to go home.  I encourage patient to follow-up closely with her  primary care provider and take all of her daily medications as prescribed.  I also encourage patient to call her neurologist today to schedule a follow-up appointment.  Per neurology recommendations patient will be discharged with PCP/neurology follow-up.  At this time there does not appear to be any evidence of an acute emergency medical condition and the patient appears stable for discharge with appropriate outpatient follow up. Diagnosis was discussed with patient who verbalizes understanding of care plan and is agreeable to discharge. I have discussed return precautions with patient who verbalizes understanding.  Patient encouraged to follow-up with their PCP and neurology. All questions answered.  Patient's case discussed with Dr. Sherry Ruffing who agrees with plan to discharge with follow-up.   Note: Portions of this report may have been transcribed using voice recognition software. Every effort was made to ensure accuracy; however, inadvertent computerized transcription errors may still be present.    Gari Crown 06/11/20 1816    Tegeler, Gwenyth Allegra, MD 06/11/20 2124

## 2020-06-11 NOTE — Consult Note (Signed)
Stroke Neurology Consultation Note  Consult Requested by: Dr. Sherry Ruffing  Reason for Consult: code stroke  Consult Date: 06/11/20   The history was obtained from the pt.  During history and examination, all items were able to obtain unless otherwise noted.  History of Present Illness:  Annette Deleon is a 45 y.o. African American female with PMH of HTN, obesity, pseudotumor cerebri presented to ED as code stroke with sudden onset left sided HA, left eye blurry vision and left facial numbness.   She was cleaning her house, mopping floor, vacuuming rooms until 12:30pm when she had sudden onset HA at left frontal area, associated with left eye blurry vision and left jaw numbness. She denies any weakness, numbness outside left face, speech difficulty or LOC. EMS called and on arrival BP 212/120. She took one clonidine at home. En route her BP down to 146/84. However, her symptoms persist.   Per pt, she is a Therapist, sports and she had hx of pseudotumor cerebri 5 years ago and had lumbar puncture before. She tried diamox but not able to tolerate due to numbness of feet. She said she did well for the last 5 years and on HCTZ for BP and pseudotumor cerebri and has been doing well. Today sudden onset HA not typical for her.   LSN: 12:30pm tPA Given: No: non disabling symptoms and likely non stroke  Past Medical History:  Diagnosis Date  . Hypertension   . Migraines   . Obesity   . Pseudotumor cerebri   . Sleep apnea     Past Surgical History:  Procedure Laterality Date  . ARTHROSCOPIC REPAIR ACL    . CESAREAN SECTION    . CHOLECYSTECTOMY    . KNEE SURGERY     right  . MYOMECTOMY      Family History  Problem Relation Age of Onset  . Heart attack Father 60    Social History:  reports that she has never smoked. She has never used smokeless tobacco. She reports current alcohol use. She reports that she does not use drugs.  Allergies:  Allergies  Allergen Reactions  . Imitrex [Sumatriptan Base]  Anaphylaxis  . Prednisone Palpitations  . Wellbutrin [Bupropion] Anaphylaxis  . [place] Venom [Bee Venom] Hives  . Ibuprofen Nausea And Vomiting  . Penicillins Hives    Has patient had a PCN reaction causing immediate rash, facial/tongue/throat swelling, SOB or lightheadedness with hypotension: yes Has patient had a PCN reaction causing severe rash involving mucus membranes or skin necrosis: yes Has patient had a PCN reaction that required hospitalization: no Has patient had a PCN reaction occurring within the last 10 years: yes If all of the above answers are "NO", then may proceed with Cephalosporin use.    No current facility-administered medications on file prior to encounter.   Current Outpatient Medications on File Prior to Encounter  Medication Sig Dispense Refill  . acetaminophen (TYLENOL) 500 MG tablet Take 500-1,000 mg by mouth every 6 (six) hours as needed for mild pain.    Marland Kitchen albuterol (PROVENTIL HFA;VENTOLIN HFA) 108 (90 Base) MCG/ACT inhaler Inhale 1 puff into the lungs every 6 (six) hours as needed for wheezing or shortness of breath.     Marland Kitchen atorvastatin (LIPITOR) 40 MG tablet Take 1 tablet (40 mg total) by mouth daily at 6 PM. 30 tablet 0  . azithromycin (ZITHROMAX) 250 MG tablet Take 250-500 mg by mouth See admin instructions. 500mg  on day 1 250mg  on day 2-5    .  benzonatate (TESSALON) 200 MG capsule Take 200 mg by mouth 3 (three) times daily as needed for cough.    . carvedilol (COREG) 25 MG tablet Take 25 mg by mouth 2 (two) times daily.    . cloNIDine (CATAPRES) 0.2 MG tablet Take by mouth. Take 1/2 tablet by mouth twice daily.    . diclofenac sodium (VOLTAREN) 1 % GEL Apply 2 g topically daily as needed (pain).    Marland Kitchen diltiazem (CARDIZEM CD) 120 MG 24 hr capsule Take 120 mg by mouth daily.    . fluticasone (FLONASE) 50 MCG/ACT nasal spray Place 2 sprays into both nostrils daily.    . furosemide (LASIX) 20 MG tablet Take by mouth.    . hydrochlorothiazide  (HYDRODIURIL) 25 MG tablet Take 25 mg by mouth daily.    . Liraglutide -Weight Management 18 MG/3ML SOPN Inject 3 mLs into the skin daily.    Marland Kitchen losartan (COZAAR) 100 MG tablet Take 100 mg by mouth daily.      Review of Systems: A full ROS was attempted today and was able to be performed.  Systems assessed include - Constitutional, Eyes, HENT, Respiratory, Cardiovascular, Gastrointestinal, Genitourinary, Integument/breast, Hematologic/lymphatic, Musculoskeletal, Neurological, Behavioral/Psych, Endocrine, Allergic/Immunologic - with pertinent responses as per HPI.  Physical Examination: SpO2:  [98 %] 98 % (01/09 1505) Weight:  [127.2 kg] 127.2 kg (01/09 1450)  General - well nourished, well developed, in acute distress with HA.    Ophthalmologic - fundi not visualized due to noncooperation.    Cardiovascular - regular rhythm and rate  Neuro - awake alert, mild distress due to HA, orientated x 3. No aphasia, able to name and repeat and following all commands. She did have photophobia and difficulty keep eye open but with eyes open, visual field full. No gaze palsy. Facial symmetrical, tongue midline. Moving b/l UE and LE symmetrical and equal strength. Left V3 decreased light touch sensation. Otherwise, sensation symmetrical. FTN intact b/l. Gait not tested.   NIH Stroke Scale  Level Of Consciousness 0=Alert; keenly responsive 1=Arouse to minor stimulation 2=Requires repeated stimulation to arouse or movements to pain 3=postures or unresponsive 0  LOC Questions to Month and Age 67=Answers both questions correctly 1=Answers one question correctly or dysarthria/intubated/trauma/language barrier 2=Answers neither question correctly or aphasia 0  LOC Commands      -Open/Close eyes     -Open/close grip     -Pantomime commands if communication barrier 0=Performs both tasks correctly 1=Performs one task correctly 2=Performs neighter task correctly 0  Best Gaze     -Only assess horizontal gaze  0=Normal 1=Partial gaze palsy 2=Forced deviation, or total gaze paresis 0  Visual 0=No visual loss 1=Partial hemianopia 2=Complete hemianopia 3=Bilateral hemianopia (blind including cortical blindness) 0  Facial Palsy     -Use grimace if obtunded 0=Normal symmetrical movement 1=Minor paralysis (asymmetry) 2=Partial paralysis (lower face) 3=Complete paralysis (upper and lower face) 0  Motor  0=No drift for 10/5 seconds 1=Drift, but does not hit bed 2=Some antigravity effort, hits  bed 3=No effort against gravity, limb falls 4=No movement 0=Amputation/joint fusion Right Arm 0     Leg 0    Left Arm 0     Leg 0  Limb Ataxia     - FNT/HTS 0=Absent or does not understand or paralyzed or amputation/joint fusion 1=Present in one limb 2=Present in two limbs 0  Sensory 0=Normal 1=Mild to moderate sensory loss 2=Severe to total sensory loss or coma/unresponsive 1  Best Language 0=No aphasia, normal 1=Mild to moderate aphasia  2=Severe aphasia 3=Mute, global aphasia, or coma/unresponsive 0  Dysarthria 0=Normal 1=Mild to moderate 2=Severe, unintelligible or mute/anarthric 0=intubated/unable to test 0  Extinction/Neglect 0=No abnormality 1=visual/tactile/auditory/spatia/personal inattention/Extinction to bilateral simultaneous stimulation 2=Profound neglect/extinction more than 1 modality  0  Total   1     Data Reviewed: CT HEAD CODE STROKE WO CONTRAST  Result Date: 06/11/2020 CLINICAL DATA:  Code stroke.  Left-sided weakness. EXAM: CT HEAD WITHOUT CONTRAST TECHNIQUE: Contiguous axial images were obtained from the base of the skull through the vertex without intravenous contrast. COMPARISON:  02/26/2017 FINDINGS: Brain: Normal appearance without evidence of old or acute infarction, mass lesion, hemorrhage, hydrocephalus or extra-axial collection. Vascular: No abnormal vascular finding. Skull: Normal Sinuses/Orbits: Clear/normal Other: None ASPECTS ([place] Stroke Program Early CT  Score) - Ganglionic level infarction (caudate, lentiform nuclei, internal capsule, insula, M1-M3 cortex): 7 - Supraganglionic infarction (M4-M6 cortex): 3 Total score (0-10 with 10 being normal): 10 IMPRESSION: 1. Normal head CT. 2. ASPECTS is 10. 3. These results were communicated to Elis Sauber at 3:03 pmon 1/9/2022by text page via the [facility] messaging system. Electronically Signed   By: Nelson Chimes M.D.   On: 06/11/2020 15:04    Assessment: 45 y.o. female with PMH of HTN, obesity, pseudotumor cerebri presented to ED as code stroke with sudden onset left sided HA, left eye blurry vision and left facial numbness. Time onset 12:30pm and NIHSS = 1 for left V3 decreased sensation. However, pt did have left sided HA and photophobia. She received zofran for nausea. She said her left blurry vision and left jaw numbness are improving but HA still bad. Pt not tPA candidate given non disabling symptoms and likely non stroke. CTA head and neck neg. CTV no CVST. Etiology for pt symptoms could be hypertensive urgency vs. Migraine vs. Pseudotumor cerebri flare.    Plan: Recommend to treat BP and HA per EDP. If after treatment, her HA improves or resolves, BP stabilized and she is able to ambulate in ER, she can be d/c from neuro standpoint with close neuro follow up as outpt. However, if after initial treatment, HA no improving or getting worse, she may need to be admitted for at least overnight observation, and may consider MRI brain and LP to measure opening pressure. Discussed with Dr. Sherry Ruffing  Thank you for this consultation and allowing [place] to participate in the care of this patient.  Rosalin Hawking, MD PhD Stroke Neurology 06/11/2020 3:53 PM

## 2020-06-11 NOTE — ED Triage Notes (Signed)
Pt arrived to ED via EMS w/ c/o CODE STROKE. LKW 1230 w/ the following s/s: L facial numbness, L eye blurred vision, L sided headache.  EMS VS: initial BP 212/120, last BP 146/84, HR 80, CBG 104, 98% RA, RR 16 EMS IV: 18g R AC, 20g L wrist.  4mg  zofran given IV

## 2020-06-11 NOTE — ED Provider Notes (Signed)
[facility] EMERGENCY DEPARTMENT Provider Note   CSN: [id number] Arrival date & time: 06/11/20  1444  An emergency department physician performed an initial assessment on this suspected stroke patient at 1452.  History Chief Complaint  Patient presents with  . Code Stroke    Annette Deleon is a 45 y.o. female with PMH significant for HTN, migraine headaches, and pseudotumor cerebri who presents to the ED via EMS as a code stroke.  Patient reports that approximately 12:30 PM today she developed left-sided headache with left-sided facial numbness, left-sided blurred vision, and left-sided extremity weakness/numbness.    Patient states that she was cleaning her house when all of a sudden she developed room spinning dizziness.  Upon sitting down, she developed the significant left-sided headache with left-sided extremity weakness/numbness.  She also developed the left-sided blurred vision and diminished sensation over the left side of her face.  She noted that her initial blood pressure was significantly elevated and so she took a second clonidine.  Per EMS, her blood pressure had improved significantly.  By the time of my examination, her symptoms had largely improved, but she continued to endorse photophobia, headache, and nausea.  She denies any palpitations, chest pain, shortness of breath, current numbness/weakness, vomiting, continued blurred vision, continued room spinning dizziness, or other focal neurologic deficit.  She states that she cannot take NSAIDs due to history of Helicobacter pylori.  She also had an anaphylactic reaction to Imitrex.  No history of complex migraines.  She was followed by neurologist, Dr. Merlene Laughter.  HPI     Past Medical History:  Diagnosis Date  . Hypertension   . Migraines   . Obesity   . Pseudotumor cerebri   . Sleep apnea     Patient Active Problem List   Diagnosis Date Noted  . Chest pain, rule out acute myocardial infarction  06/13/2018  . Aortic atherosclerosis ([place])   . Pure hypercholesterolemia   . Near syncope 09/08/2014  . Complicated migraine 67/05/4579  . HTN (hypertension) 11/20/2011  . Pseudotumor cerebri 11/20/2011  . Obesity 11/20/2011    Past Surgical History:  Procedure Laterality Date  . ARTHROSCOPIC REPAIR ACL    . CESAREAN SECTION    . CHOLECYSTECTOMY    . KNEE SURGERY     right  . MYOMECTOMY       OB History   No obstetric history on file.     Family History  Problem Relation Age of Onset  . Heart attack Father 30    Social History   Tobacco Use  . Smoking status: Never Smoker  . Smokeless tobacco: Never Used  Vaping Use  . Vaping Use: Never used  Substance Use Topics  . Alcohol use: Yes    Comment: occasionally  . Drug use: No    Home Medications Prior to Admission medications   Medication Sig Start Date End Date Taking? Authorizing Provider  acetaminophen (TYLENOL) 500 MG tablet Take 500-1,000 mg by mouth every 6 (six) hours as needed for mild pain.   Yes [provider]  albuterol (PROVENTIL HFA;VENTOLIN HFA) 108 (90 Base) MCG/ACT inhaler Inhale 1 puff into the lungs every 6 (six) hours as needed for wheezing or shortness of breath.  11/19/16  Yes [provider]  atorvastatin (LIPITOR) 40 MG tablet Take 1 tablet (40 mg total) by mouth daily at 6 PM. 06/13/18  Yes Bonnielee Haff, MD  azithromycin (ZITHROMAX) 250 MG tablet Take 250-500 mg by mouth See admin instructions. 500mg  on  day 1 250mg  on day 2-5 06/01/20  Yes [provider]  benzonatate (TESSALON) 200 MG capsule Take 200 mg by mouth 3 (three) times daily as needed for cough. 06/01/20  Yes [provider]  Budesonide 90 MCG/ACT inhaler Inhale 1 puff into the lungs every 6 (six) hours as needed (wheezing or shortness of breath). 12/08/19  Yes [provider]  carvedilol (COREG) 25 MG tablet Take 25 mg by mouth 2 (two) times daily. 06/24/19  Yes [provider]   cloNIDine (CATAPRES) 0.2 MG tablet Take by mouth. Take 1/2 tablet by mouth twice daily. 06/30/19  Yes [provider]  diclofenac sodium (VOLTAREN) 1 % GEL Apply 2 g topically daily as needed (pain).   Yes [provider]  diltiazem (CARDIZEM CD) 120 MG 24 hr capsule Take 120 mg by mouth daily. 05/26/20  Yes [provider]  fluticasone (FLONASE) 50 MCG/ACT nasal spray Place 2 sprays into both nostrils daily. 06/01/20  Yes [provider]  furosemide (LASIX) 20 MG tablet Take 20 mg by mouth daily. 09/21/19  Yes [provider]  gabapentin (NEURONTIN) 300 MG capsule Take 300 mg by mouth at bedtime as needed (pain). 01/17/20  Yes [provider]  hydrochlorothiazide (HYDRODIURIL) 25 MG tablet Take 25 mg by mouth daily.   Yes [provider]  Liraglutide -Weight Management 18 MG/3ML SOPN Inject 3 mLs into the skin daily.   Yes [provider]  loratadine (CLARITIN) 10 MG tablet Take 10 mg by mouth daily. 01/18/20  Yes [provider]  losartan (COZAAR) 100 MG tablet Take 100 mg by mouth daily.   Yes [provider]  predniSONE (DELTASONE) 5 MG tablet Take 5 mg by mouth daily. 01/10/20  Yes [provider]    Allergies    Imitrex [sumatriptan base], Prednisone, Wellbutrin [bupropion], Yellow jacket venom [bee venom], Ibuprofen, and Penicillins  Review of Systems   Review of Systems  Physical Exam Updated Vital Signs Ht 5\' 3"  (1.6 m)   Wt 127.2 kg   SpO2 98%   BMI 49.68 kg/m   Physical Exam  ED Results / Procedures / Treatments   Labs (all labs ordered are listed, but only abnormal results are displayed) Labs Reviewed  CBC - Abnormal; Notable for the following components:      Result Value   Platelets 420 (*)    All other components within normal limits  COMPREHENSIVE METABOLIC PANEL - Abnormal; Notable for the following components:   CO2 21 (*)    Glucose, Bld 100 (*)    Creatinine, Ser  1.06 (*)    All other components within normal limits  I-STAT CHEM 8, ED - Abnormal; Notable for the following components:   Calcium, Ion 1.07 (*)    All other components within normal limits  PROTIME-INR  APTT  DIFFERENTIAL  CBG MONITORING, ED  I-STAT BETA HCG BLOOD, ED ([facility], [facility], [facility] ONLY)  I-STAT BETA HCG BLOOD, ED ([facility], [facility], [facility] ONLY)    EKG None  Radiology CT ANGIO HEAD W OR WO CONTRAST  Result Date: 06/11/2020 CLINICAL DATA:  Left-sided weakness.  Diabetes. EXAM: CT ANGIOGRAPHY HEAD AND NECK TECHNIQUE: Multidetector CT imaging of the head and neck was performed using the standard protocol during bolus administration of intravenous contrast. Multiplanar CT image reconstructions and MIPs were obtained to evaluate the vascular anatomy. Carotid stenosis measurements (when applicable) are obtained utilizing NASCET criteria, using the distal internal carotid diameter as the denominator. CONTRAST:  92mL OMNIPAQUE  IOHEXOL 350 MG/ML SOLN COMPARISON:  Head CT same day FINDINGS: CTA NECK FINDINGS Aortic arch: Aortic atherosclerosis. Branching pattern is normal without origin stenosis. Right carotid system: Common carotid artery widely patent to the bifurcation. No carotid bifurcation stenosis or irregularity. Minimal atherosclerotic plaque. Left carotid system: Common carotid artery widely patent to the bifurcation. Minimal atherosclerotic plaque at the bifurcation but no stenosis. Vertebral arteries: Both vertebral arteries are widely patent at their origins and through the cervical region. Some motion degradation, but no suspicion of significant disease. Skeleton: Normal Other neck: No mass or lymphadenopathy. Upper chest: Negative Review of the MIP images confirms the above findings CTA HEAD FINDINGS Anterior circulation: Both internal carotid arteries are patent through the skull base and siphon regions. The anterior and middle cerebral vessels are patent. No large or medium vessel occlusion. Posterior  circulation: Both vertebral arteries widely patent to the basilar. No basilar stenosis. Posterior circulation branch vessels are normal. Venous sinuses: Venous sinuses are patent. Transverse sinuses are diminutive, particularly on the right, but I do not see evidence of venous thrombosis. Anatomic variants: None significant. Review of the MIP images confirms the above findings IMPRESSION: 1. No large or medium vessel occlusion. 2. Minimal atherosclerotic change at both carotid bifurcations but without stenosis. Tortuous vessels suggesting a history of hypertension. 3. These results were communicated to Xu at 3:38 pmon 1/9/2022by text page via the [facility] messaging system. Aortic Atherosclerosis (ICD10-I70.0). Electronically Signed   By: Nelson Chimes M.D.   On: 06/11/2020 15:39   CT ANGIO NECK W OR WO CONTRAST  Result Date: 06/11/2020 CLINICAL DATA:  Left-sided weakness.  Diabetes. EXAM: CT ANGIOGRAPHY HEAD AND NECK TECHNIQUE: Multidetector CT imaging of the head and neck was performed using the standard protocol during bolus administration of intravenous contrast. Multiplanar CT image reconstructions and MIPs were obtained to evaluate the vascular anatomy. Carotid stenosis measurements (when applicable) are obtained utilizing NASCET criteria, using the distal internal carotid diameter as the denominator. CONTRAST:  27mL OMNIPAQUE IOHEXOL 350 MG/ML SOLN COMPARISON:  Head CT same day FINDINGS: CTA NECK FINDINGS Aortic arch: Aortic atherosclerosis. Branching pattern is normal without origin stenosis. Right carotid system: Common carotid artery widely patent to the bifurcation. No carotid bifurcation stenosis or irregularity. Minimal atherosclerotic plaque. Left carotid system: Common carotid artery widely patent to the bifurcation. Minimal atherosclerotic plaque at the bifurcation but no stenosis. Vertebral arteries: Both vertebral arteries are widely patent at their origins and through the cervical region. Some  motion degradation, but no suspicion of significant disease. Skeleton: Normal Other neck: No mass or lymphadenopathy. Upper chest: Negative Review of the MIP images confirms the above findings CTA HEAD FINDINGS Anterior circulation: Both internal carotid arteries are patent through the skull base and siphon regions. The anterior and middle cerebral vessels are patent. No large or medium vessel occlusion. Posterior circulation: Both vertebral arteries widely patent to the basilar. No basilar stenosis. Posterior circulation branch vessels are normal. Venous sinuses: Venous sinuses are patent. Transverse sinuses are diminutive, particularly on the right, but I do not see evidence of venous thrombosis. Anatomic variants: None significant. Review of the MIP images confirms the above findings IMPRESSION: 1. No large or medium vessel occlusion. 2. Minimal atherosclerotic change at both carotid bifurcations but without stenosis. Tortuous vessels suggesting a history of hypertension. 3. These results were communicated to Xu at 3:38 pmon 1/9/2022by text page via the [facility] messaging system. Aortic Atherosclerosis (ICD10-I70.0). Electronically Signed   By: Nelson Chimes M.D.   On: 06/11/2020  15:39   CT HEAD CODE STROKE WO CONTRAST  Result Date: 06/11/2020 CLINICAL DATA:  Code stroke.  Left-sided weakness. EXAM: CT HEAD WITHOUT CONTRAST TECHNIQUE: Contiguous axial images were obtained from the base of the skull through the vertex without intravenous contrast. COMPARISON:  02/26/2017 FINDINGS: Brain: Normal appearance without evidence of old or acute infarction, mass lesion, hemorrhage, hydrocephalus or extra-axial collection. Vascular: No abnormal vascular finding. Skull: Normal Sinuses/Orbits: Clear/normal Other: None ASPECTS ([place] Stroke Program Early CT Score) - Ganglionic level infarction (caudate, lentiform nuclei, internal capsule, insula, M1-M3 cortex): 7 - Supraganglionic infarction (M4-M6 cortex): 3 Total score  (0-10 with 10 being normal): 10 IMPRESSION: 1. Normal head CT. 2. ASPECTS is 10. 3. These results were communicated to Xu at 3:03 pmon 1/9/2022by text page via the [facility] messaging system. Electronically Signed   By: Nelson Chimes M.D.   On: 06/11/2020 15:04    Procedures Procedures (including critical care time)  Medications Ordered in ED Medications  prochlorperazine (COMPAZINE) injection 10 mg (has no administration in time range)  sodium chloride 0.9 % bolus 500 mL (has no administration in time range)  diphenhydrAMINE (BENADRYL) injection 25 mg (has no administration in time range)  sodium chloride flush (NS) 0.9 % injection 3 mL (3 mLs Intravenous Given 06/11/20 1459)  ondansetron (ZOFRAN) injection 4 mg (4 mg Intravenous Given 06/11/20 1459)  iohexol (OMNIPAQUE) 350 MG/ML injection 75 mL (75 mLs Intravenous Contrast Given 06/11/20 1506)    ED Course  I have reviewed the triage vital signs and the nursing notes.  Pertinent labs & imaging results that were available during my care of the patient were reviewed by me and considered in my medical decision making (see chart for details).    MDM Rules/Calculators/A&P                          Patient is not a tPA candidate.  Will obtain CT head without contrast and then proceed with CTA to assess for venous sinus thrombosis if negative.  Imaging obtained here in the ED was unremarkable.  Per neurology, plan is for BP stabilization and headache treatment here in the ED.  If she can ambulate, she will be reasonable for discharge home with close neurologic outpatient follow-up.  However, if patient is not feeling improved with treatments or if her headache worsens, she may require overnight observation +/- MRI/LP.    She states that she is still endorsing mild nausea despite being treated with Zofran upon arrival.  She cannot take Imitrex or NSAIDs.  Will treat with 1 L IV NS, prochlorperazine, and Benadryl here in the ED and then reassess.  Final  Clinical Impression(s) / ED Diagnoses Final diagnoses:  Acute nonintractable headache, unspecified headache type    Rx / DC Orders ED Discharge Orders    None       Corena Herter, PA-C 06/11/20 1657    Tegeler, Gwenyth Allegra, MD 06/11/20 2126

## 2020-07-03 ENCOUNTER — Other Ambulatory Visit: Payer: Self-pay

## 2020-07-03 ENCOUNTER — Inpatient Hospital Stay (HOSPITAL_COMMUNITY)
Admission: EM | Admit: 2020-07-03 | Discharge: 2020-07-05 | DRG: 305 | Disposition: A | Discharge status: Home / Self Care | Payer: Managed Care, Other (non HMO) | Attending: Internal Medicine | Admitting: Internal Medicine

## 2020-07-03 ENCOUNTER — Emergency Department (HOSPITAL_COMMUNITY): Payer: Managed Care, Other (non HMO)

## 2020-07-03 ENCOUNTER — Encounter (HOSPITAL_COMMUNITY): Payer: Self-pay

## 2020-07-03 DIAGNOSIS — U071 COVID-19: Secondary | ICD-10-CM | POA: Diagnosis present

## 2020-07-03 DIAGNOSIS — Z888 Allergy status to other drugs, medicaments and biological substances status: Secondary | ICD-10-CM

## 2020-07-03 DIAGNOSIS — I10 Essential (primary) hypertension: Secondary | ICD-10-CM | POA: Diagnosis not present

## 2020-07-03 DIAGNOSIS — Z880 Allergy status to penicillin: Secondary | ICD-10-CM | POA: Diagnosis not present

## 2020-07-03 DIAGNOSIS — R911 Solitary pulmonary nodule: Secondary | ICD-10-CM | POA: Diagnosis present

## 2020-07-03 DIAGNOSIS — I5031 Acute diastolic (congestive) heart failure: Secondary | ICD-10-CM | POA: Diagnosis not present

## 2020-07-03 DIAGNOSIS — I160 Hypertensive urgency: Secondary | ICD-10-CM | POA: Diagnosis present

## 2020-07-03 DIAGNOSIS — I700 Atherosclerosis of aorta: Secondary | ICD-10-CM | POA: Diagnosis present

## 2020-07-03 DIAGNOSIS — I509 Heart failure, unspecified: Secondary | ICD-10-CM

## 2020-07-03 DIAGNOSIS — Z91030 Bee allergy status: Secondary | ICD-10-CM | POA: Diagnosis not present

## 2020-07-03 DIAGNOSIS — Z79899 Other long term (current) drug therapy: Secondary | ICD-10-CM | POA: Diagnosis not present

## 2020-07-03 DIAGNOSIS — I110 Hypertensive heart disease with heart failure: Secondary | ICD-10-CM | POA: Diagnosis present

## 2020-07-03 DIAGNOSIS — R079 Chest pain, unspecified: Secondary | ICD-10-CM

## 2020-07-03 DIAGNOSIS — Z8249 Family history of ischemic heart disease and other diseases of the circulatory system: Secondary | ICD-10-CM

## 2020-07-03 DIAGNOSIS — Z6841 Body Mass Index (BMI) 40.0 and over, adult: Secondary | ICD-10-CM

## 2020-07-03 DIAGNOSIS — I5032 Chronic diastolic (congestive) heart failure: Secondary | ICD-10-CM | POA: Diagnosis present

## 2020-07-03 DIAGNOSIS — J9811 Atelectasis: Secondary | ICD-10-CM | POA: Diagnosis present

## 2020-07-03 DIAGNOSIS — Z8616 Personal history of COVID-19: Secondary | ICD-10-CM | POA: Diagnosis not present

## 2020-07-03 LAB — CBC
HCT: 42.4 % (ref 36.0–46.0)
Hemoglobin: 13.9 g/dL (ref 12.0–15.0)
MCH: 26.3 pg (ref 26.0–34.0)
MCHC: 32.8 g/dL (ref 30.0–36.0)
MCV: 80.3 fL (ref 80.0–100.0)
Platelets: 476 10*3/uL — ABNORMAL HIGH (ref 150–400)
RBC: 5.28 MIL/uL — ABNORMAL HIGH (ref 3.87–5.11)
RDW: 14.6 % (ref 11.5–15.5)
WBC: 29.8 10*3/uL — ABNORMAL HIGH (ref 4.0–10.5)
nRBC: 0 % (ref 0.0–0.2)

## 2020-07-03 LAB — BASIC METABOLIC PANEL
Anion gap: 10 (ref 5–15)
BUN: 13 mg/dL (ref 6–20)
CO2: 26 mmol/L (ref 22–32)
Calcium: 9.9 mg/dL (ref 8.9–10.3)
Chloride: 105 mmol/L (ref 98–111)
Creatinine, Ser: 1.12 mg/dL — ABNORMAL HIGH (ref 0.44–1.00)
GFR, Estimated: >60 mL/min (ref >60)
Glucose, Bld: 164 mg/dL — ABNORMAL HIGH (ref 70–99)
Potassium: 3.9 mmol/L (ref 3.5–5.1)
Sodium: 141 mmol/L (ref 135–145)

## 2020-07-03 LAB — BRAIN NATRIURETIC PEPTIDE: B Natriuretic Peptide: 455.1 pg/mL — ABNORMAL HIGH (ref 0.0–100.0)

## 2020-07-03 LAB — D-DIMER, QUANTITATIVE: D-Dimer, Quant: 0.54 ug/mL-FEU — ABNORMAL HIGH (ref 0.00–0.50)

## 2020-07-03 LAB — PROCALCITONIN: Procalcitonin: <0.1 ng/mL

## 2020-07-03 LAB — TROPONIN I (HIGH SENSITIVITY)
Troponin I (High Sensitivity): 31 ng/L — ABNORMAL HIGH (ref <18)
Troponin I (High Sensitivity): 46 ng/L — ABNORMAL HIGH (ref <18)

## 2020-07-03 LAB — I-STAT BETA HCG BLOOD, ED (MC, WL, AP ONLY): I-stat hCG, quantitative: <5 m[IU]/mL (ref <5)

## 2020-07-03 MED ORDER — LABETALOL HCL 5 MG/ML IV SOLN
10.0000 mg | INTRAVENOUS | Status: DC | PRN
  Administered 2020-07-03: 10 mg via INTRAVENOUS
  Filled 2020-07-03 (×2): qty 4

## 2020-07-03 MED ORDER — HYDROXYZINE HCL 25 MG PO TABS
25.0000 mg | ORAL_TABLET | Freq: Three times a day (TID) | ORAL | Status: DC | PRN
  Administered 2020-07-03 – 2020-07-04 (×2): 25 mg via ORAL
  Filled 2020-07-03 (×2): qty 1

## 2020-07-03 MED ORDER — ACETAMINOPHEN 325 MG PO TABS: 650.0000 mg | ORAL_TABLET | ORAL | Status: DC | PRN

## 2020-07-03 MED ORDER — ENOXAPARIN SODIUM 40 MG/0.4ML ~~LOC~~ SOLN
40.0000 mg | SUBCUTANEOUS | Status: DC
  Administered 2020-07-03 – 2020-07-04 (×2): 40 mg via SUBCUTANEOUS
  Filled 2020-07-03 (×2): qty 0.4

## 2020-07-03 MED ORDER — ONDANSETRON HCL 4 MG/2ML IJ SOLN
4.0000 mg | Freq: Three times a day (TID) | INTRAMUSCULAR | Status: DC | PRN
  Administered 2020-07-03: 4 mg via INTRAVENOUS
  Filled 2020-07-03: qty 2

## 2020-07-03 MED ORDER — ATORVASTATIN CALCIUM 40 MG PO TABS
40.0000 mg | ORAL_TABLET | Freq: Every day | ORAL | Status: DC
  Administered 2020-07-03 – 2020-07-04 (×2): 40 mg via ORAL
  Filled 2020-07-03 (×2): qty 1

## 2020-07-03 MED ORDER — GUAIFENESIN ER 600 MG PO TB12: 600.0000 mg | ORAL_TABLET | Freq: Two times a day (BID) | ORAL | Status: DC | PRN

## 2020-07-03 MED ORDER — FLUTICASONE PROPIONATE 50 MCG/ACT NA SUSP: 2.0000 | Freq: Every day | NASAL | Status: DC | PRN

## 2020-07-03 MED ORDER — LORATADINE 10 MG PO TABS
10.0000 mg | ORAL_TABLET | Freq: Every day | ORAL | Status: DC
  Administered 2020-07-03 – 2020-07-05 (×3): 10 mg via ORAL
  Filled 2020-07-03 (×3): qty 1

## 2020-07-03 MED ORDER — CARVEDILOL 25 MG PO TABS
25.0000 mg | ORAL_TABLET | Freq: Two times a day (BID) | ORAL | Status: DC
  Administered 2020-07-03 – 2020-07-05 (×4): 25 mg via ORAL
  Filled 2020-07-03: qty 2
  Filled 2020-07-03 (×2): qty 1
  Filled 2020-07-03: qty 2
  Filled 2020-07-03: qty 1

## 2020-07-03 MED ORDER — BENZONATATE 100 MG PO CAPS: 200.0000 mg | ORAL_CAPSULE | Freq: Three times a day (TID) | ORAL | Status: DC | PRN

## 2020-07-03 MED ORDER — MORPHINE SULFATE (PF) 4 MG/ML IV SOLN
4.0000 mg | Freq: Once | INTRAVENOUS | Status: AC
  Administered 2020-07-03: 4 mg via INTRAVENOUS
  Filled 2020-07-03: qty 1

## 2020-07-03 MED ORDER — HYDRALAZINE HCL 25 MG PO TABS: 25.0000 mg | ORAL_TABLET | ORAL | Status: DC | PRN

## 2020-07-03 MED ORDER — LABETALOL HCL 5 MG/ML IV SOLN
20.0000 mg | Freq: Once | INTRAVENOUS | Status: AC
  Administered 2020-07-03: 20 mg via INTRAVENOUS
  Filled 2020-07-03: qty 4

## 2020-07-03 MED ORDER — LOSARTAN POTASSIUM 50 MG PO TABS
100.0000 mg | ORAL_TABLET | Freq: Every day | ORAL | Status: DC
  Administered 2020-07-03 – 2020-07-05 (×3): 100 mg via ORAL
  Filled 2020-07-03 (×2): qty 2
  Filled 2020-07-03: qty 4

## 2020-07-03 MED ORDER — FUROSEMIDE 10 MG/ML IJ SOLN
20.0000 mg | Freq: Once | INTRAMUSCULAR | Status: AC
  Administered 2020-07-03: 20 mg via INTRAVENOUS
  Filled 2020-07-03: qty 4

## 2020-07-03 MED ORDER — CLONIDINE HCL 0.1 MG PO TABS
0.1000 mg | ORAL_TABLET | Freq: Once | ORAL | Status: AC
  Administered 2020-07-03: 0.1 mg via ORAL
  Filled 2020-07-03: qty 1

## 2020-07-03 MED ORDER — DILTIAZEM HCL ER COATED BEADS 120 MG PO CP24
120.0000 mg | ORAL_CAPSULE | Freq: Every day | ORAL | Status: DC
  Administered 2020-07-03 – 2020-07-05 (×3): 120 mg via ORAL
  Filled 2020-07-03 (×3): qty 1

## 2020-07-03 MED ORDER — IOHEXOL 350 MG/ML SOLN
100.0000 mL | Freq: Once | INTRAVENOUS | Status: AC | PRN
  Administered 2020-07-03: 65 mL via INTRAVENOUS

## 2020-07-03 MED ORDER — SODIUM CHLORIDE 0.9% FLUSH
3.0000 mL | Freq: Two times a day (BID) | INTRAVENOUS | Status: DC
  Administered 2020-07-03 – 2020-07-05 (×4): 3 mL via INTRAVENOUS

## 2020-07-03 MED ORDER — GABAPENTIN 300 MG PO CAPS: 300.0000 mg | ORAL_CAPSULE | Freq: Every evening | ORAL | Status: DC | PRN

## 2020-07-03 NOTE — Assessment & Plan Note (Signed)
-  On Coreg, Cardizem, HCTZ, losartan at home and PRN clonidine

## 2020-07-03 NOTE — Assessment & Plan Note (Signed)
-   likely 2/2 uncontrolled BP - will work on BP control first; no obvious volume overload on exam and lungs are clear on imaging  - follow up repeat echo; history of Gr 1 DD on last echo

## 2020-07-03 NOTE — Assessment & Plan Note (Addendum)
-   tested positive on 1/21.  States that she has received approximately 2 courses of antibiotics and steroids with minimal improvement.  She is not hypoxic and CXR and CTA chest are clear.  She is also vaccinated.  Suspect that her pleuritic chest pain and shortness of breath is due to severely elevated blood pressure with smaller component of COVID contributing.  At this time, would prefer to target blood pressure control and monitor clinical response -Does not meet criteria for remdesivir or further steroid treatment given outpatient courses already.  Also not likely good monoclonal antibody candidate given duration of symptoms from onset -Favor supportive care and conservative management for now - check PCT and trend inflammatory markers while in hospital - leukocytosis considered demargination from steroids for now; follow up CBC in am and PCT as noted. She does not appear toxic/septic

## 2020-07-03 NOTE — Assessment & Plan Note (Signed)
-   continue statin; no asa listed on med rec - patient underwent NM stress test 06/13/2018 (low risk study/negative for ischemia and showed "breast attenuation") - seen by cardiology on 06/23/19 (note reviewed in care everywhere): Focus was better blood pressure control regarding difficult to manage hypertension

## 2020-07-03 NOTE — Hospital Course (Signed)
Annette Deleon is a 45 yo female with PMH obesity, HTN, migraines who presented to the ER after she was told to present because of an abnormal EKG outpatient. She has had complaints of shortness of breath and pleuritic chest pain.  She was diagnosed with COVID-19 on 06/23/2020.  She has been vaccinated.  She is a Marine scientist for [employer]. She also has completed 2 courses of antibiotics and steroids with minimal improvement outpatient.  Her last doses were approximately Saturday, she was unsure of last doses when asked in the ER.  On work-up in the ER she was afebrile, 98.7, heart rate 115, respirations 30, blood pressure 222/119 initially, 100% on room air.  CXR was clear.  Due to mildly elevated D-dimer and pleuritic chest pain she underwent CTA chest which was negative for PE.  There was a 6 mm left lower lobe nodule which will need follow-up outpatient with repeat CT.  Given her severely elevated blood pressure which did not respond to treatment in the ER, she was admitted for further evaluation and treatment.

## 2020-07-03 NOTE — ED Triage Notes (Signed)
Pt presents with c/o chest pain and shortness of breath. Pt reports she was diagnosed with Covid on 1/21. Pt reports shortness of breath through her quarantine but reported that 3 days ago, she began to have chest pain. Pt reports that she went to UC yesterday and had an EKG done and was told that it was normal. Pt then reports that she received a phone call this morning from a PA at the [facility] and was told she needed to come to the hospital immediately based on her EKG from yesterday.

## 2020-07-03 NOTE — Clinical Social Work Note (Signed)
TOC consulted due to pt being admitted, having child present, and having no one to take her child. CSW spoke with pt over the phone. Pt states that she was told by whoever called to do her admission paperwork that it was okay that her child would be with her, pt unable to name who she spoke with. CSW explained that she would not be able to have her son go to the floor with her for admission. CSW attempted to explain that the only option if she had no one to get her son would be for CSW to call CPS as they have the ability to help families in this situation. CSW informed pt that it would not be a report made. 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 CSW attempted to inform pt that it would not fall back on her as it is not a report to CPS that would be made. Pt states she does not want this, she will try to figure something out or she will leave AMA.

## 2020-07-03 NOTE — Assessment & Plan Note (Signed)
-  Patient states blood pressure is typically controlled at home on her regimen however she did not take medications this morning which she thinks may explain uncontrolled pressure -Home meds resumed in the ER without significant effect -Start on PRN labetalol and hydralazine and rotate to achieve control; if still unable will need cardene drip most likely  - obtain echo - last echo 05/2019: EF 55-65%, Gr 1 DD, mild LVH - trend troponin

## 2020-07-03 NOTE — ED Provider Notes (Signed)
[facility] DEPT Provider Note   CSN: [id number] Arrival date & time: 07/03/20  3419     History Chief Complaint  Patient presents with  . Chest Pain  . Covid Positive  . Shortness of Breath    NATALIA WITTMEYER is a 45 y.o. female.  HPI   Patient presents to the ED for evaluation of chest pain and dyspnea.  Patient states she was diagnosed with covid back on January 21.  Patient is fully vaccinated.  sHe works for [employer]  Patient has been feeling short of breath with her covid diagnosis.  She has had a couple of telehealth visits.  Patient states over the last 3 days she started having issues with intermittent sharp chest pain.  She continued to have her breathing issues.  Patient went to an urgent care.  She had a an EKG.  Patient states that initially she was told it was fine but this morning she was called back and told that her EKG was abnormal and she needed to come to the ED for evaluation.  Patient does have history of hypertension.  She states normally she takes her medications in the morning but had not taken them yet.  She is not having any fevers.  No new leg swelling.  She is concerned about the possibility of a blood clot as she has had sharp intermittent pains on the left side of her chest.  Past Medical History:  Diagnosis Date  . Hypertension   . Migraines   . Obesity   . Pseudotumor cerebri   . Sleep apnea     Patient Active Problem List   Diagnosis Date Noted  . Chest pain, rule out acute myocardial infarction 06/13/2018  . Aortic atherosclerosis ([place])   . Pure hypercholesterolemia   . Near syncope 09/08/2014  . Complicated migraine 37/90/2409  . HTN (hypertension) 11/20/2011  . Pseudotumor cerebri 11/20/2011  . Obesity 11/20/2011    Past Surgical History:  Procedure Laterality Date  . ARTHROSCOPIC REPAIR ACL    . CESAREAN SECTION    . CHOLECYSTECTOMY    . KNEE SURGERY     right  . MYOMECTOMY       OB History    No obstetric history on file.     Family History  Problem Relation Age of Onset  . Heart attack Father 76    Social History   Tobacco Use  . Smoking status: Never Smoker  . Smokeless tobacco: Never Used  Vaping Use  . Vaping Use: Never used  Substance Use Topics  . Alcohol use: Yes    Comment: occasionally  . Drug use: No    Home Medications Prior to Admission medications   Medication Sig Start Date End Date Taking? Authorizing Provider  acetaminophen (TYLENOL) 500 MG tablet Take 500-1,000 mg by mouth every 6 (six) hours as needed for mild pain.   Yes [provider]  albuterol (PROVENTIL HFA;VENTOLIN HFA) 108 (90 Base) MCG/ACT inhaler Inhale 1 puff into the lungs every 6 (six) hours as needed for wheezing or shortness of breath.  11/19/16  Yes [provider]  atorvastatin (LIPITOR) 40 MG tablet Take 1 tablet (40 mg total) by mouth daily at 6 PM. 06/13/18  Yes Bonnielee Haff, MD  benzonatate (TESSALON) 200 MG capsule Take 200 mg by mouth 3 (three) times daily as needed for cough. 06/01/20  Yes [provider]  Budesonide 90 MCG/ACT inhaler Inhale 1 puff into the lungs every 6 (six)  hours as needed (wheezing or shortness of breath). 12/08/19  Yes [provider]  carvedilol (COREG) 25 MG tablet Take 25 mg by mouth 2 (two) times daily. 06/24/19  Yes [provider]  cloNIDine (CATAPRES) 0.2 MG tablet Take 0.1 mg by mouth daily as needed (if systolic Blood pressure is > 160). 06/30/19  Yes [provider]  diclofenac sodium (VOLTAREN) 1 % GEL Apply 2 g topically daily as needed (pain).   Yes [provider]  diltiazem (CARDIZEM CD) 120 MG 24 hr capsule Take 120 mg by mouth daily. 05/26/20  Yes [provider]  DM-Doxylamine-Acetaminophen (NYQUIL HBP COLD & FLU) 15-6.25-325 MG/15ML LIQD Take 10 mLs by mouth every 8 (eight) hours as needed (rest or cold symptoms).   Yes [provider]   DM-Phenylephrine-Acetaminophen (VICKS DAYQUIL COLD & FLU) 10-5-325 MG/15ML LIQD Take 10 mLs by mouth every 6 (six) hours as needed (cold symptoms).   Yes [provider]  fluticasone (FLONASE) 50 MCG/ACT nasal spray Place 2 sprays into both nostrils daily as needed for allergies. 06/01/20  Yes [provider]  furosemide (LASIX) 20 MG tablet Take 20 mg by mouth daily. 09/21/19  Yes [provider]  gabapentin (NEURONTIN) 300 MG capsule Take 300 mg by mouth at bedtime as needed (pain). 01/17/20  Yes [provider]  guaiFENesin (MUCINEX) 600 MG 12 hr tablet Take 600 mg by mouth 2 (two) times daily as needed for cough or to loosen phlegm.   Yes [provider]  hydrochlorothiazide (HYDRODIURIL) 25 MG tablet Take 25 mg by mouth daily.   Yes [provider]  Liraglutide -Weight Management 18 MG/3ML SOPN Inject 3 mLs into the skin daily.   Yes [provider]  loratadine (CLARITIN) 10 MG tablet Take 10 mg by mouth daily. 01/18/20  Yes [provider]  losartan (COZAAR) 100 MG tablet Take 100 mg by mouth daily.   Yes [provider]  predniSONE (DELTASONE) 5 MG tablet Take 5 mg by mouth daily. 01/10/20  Yes [provider]  cefdinir (OMNICEF) 300 MG capsule Take 300 mg by mouth in the morning and at bedtime. 06/22/20   [provider]    Allergies    Imitrex [sumatriptan base], Prednisone, Wellbutrin [bupropion], Yellow jacket venom [bee venom], Ibuprofen, and Penicillins  Review of Systems   Review of Systems  All other systems reviewed and are negative.   Physical Exam Updated Vital Signs BP (!) 201/113   Pulse 71   Temp 98.7 F (37.1 C) (Oral)   Resp 15   LMP 06/20/2019 (Approximate)   SpO2 99%   Physical Exam Vitals and nursing note reviewed.  Constitutional:      Appearance: She is well-developed, overweight and well-nourished. She is not diaphoretic.  HENT:     Head: Normocephalic and  atraumatic.     Right Ear: External ear normal.     Left Ear: External ear normal.  Eyes:     General: No scleral icterus.       Right eye: No discharge.        Left eye: No discharge.     Conjunctiva/sclera: Conjunctivae normal.  Neck:     Trachea: No tracheal deviation.  Cardiovascular:     Rate and Rhythm: Regular rhythm. Tachycardia present.     Pulses: Intact distal pulses.  Pulmonary:     Effort: Pulmonary effort is normal. No respiratory distress.     Breath sounds: Normal breath sounds. No stridor. No wheezing  or rales.  Abdominal:     General: Bowel sounds are normal. There is no distension.     Palpations: Abdomen is soft.     Tenderness: There is no abdominal tenderness. There is no guarding or rebound.  Musculoskeletal:        General: No tenderness or edema.     Cervical back: Neck supple.  Skin:    General: Skin is warm and dry.     Findings: No rash.  Neurological:     Mental Status: She is alert.     Cranial Nerves: No cranial nerve deficit (no facial droop, extraocular movements intact, no slurred speech).     Sensory: No sensory deficit.     Motor: No abnormal muscle tone or seizure activity.     Coordination: Coordination normal.     Deep Tendon Reflexes: Strength normal.  Psychiatric:        Mood and Affect: Mood and affect normal.     ED Results / Procedures / Treatments   Labs (all labs ordered are listed, but only abnormal results are displayed) Labs Reviewed  BASIC METABOLIC PANEL - Abnormal; Notable for the following components:      Result Value   Glucose, Bld 164 (*)    Creatinine, Ser 1.12 (*)    All other components within normal limits  CBC - Abnormal; Notable for the following components:   WBC 29.8 (*)    RBC 5.28 (*)    Platelets 476 (*)    All other components within normal limits  BRAIN NATRIURETIC PEPTIDE - Abnormal; Notable for the following components:   B Natriuretic Peptide 455.1 (*)    All other components within normal  limits  D-DIMER, QUANTITATIVE (NOT AT [facility]) - Abnormal; Notable for the following components:   D-Dimer, Quant 0.54 (*)    All other components within normal limits  TROPONIN I (HIGH SENSITIVITY) - Abnormal; Notable for the following components:   Troponin I (High Sensitivity) 31 (*)    All other components within normal limits  I-STAT BETA HCG BLOOD, ED ([facility], [facility], [facility] ONLY)  TROPONIN I (HIGH SENSITIVITY)    EKG None Sinus tachycardia rate 121 Normal axis Normal intervals Normal ST-T waves Poor R wave progression Radiology CT Angio Chest PE W and/or Wo Contrast  Result Date: 07/03/2020 CLINICAL DATA:  Positive D-dimer. Clinical concern for pulmonary embolus. Chest pain and shortness of breath. EXAM: CT ANGIOGRAPHY CHEST WITH CONTRAST TECHNIQUE: Multidetector CT imaging of the chest was performed using the standard protocol during bolus administration of intravenous contrast. Multiplanar CT image reconstructions and MIPs were obtained to evaluate the vascular anatomy. CONTRAST:  62mL OMNIPAQUE IOHEXOL 350 MG/ML SOLN COMPARISON:  06/12/2018 FINDINGS: Cardiovascular: The heart size is normal. No substantial pericardial effusion. Atherosclerotic calcification is noted in the wall of the thoracic aorta. There is no filling defect within the opacified pulmonary arteries to suggest the presence of an acute pulmonary embolus. Mediastinum/Nodes: No mediastinal lymphadenopathy. Upper normal lymph nodes are seen in the right paratracheal space and subcarinal station. There is no hilar lymphadenopathy. Tiny hiatal hernia. The esophagus has normal imaging features. There is no axillary lymphadenopathy. Lungs/Pleura: 6 mm left lower lobe pulmonary nodule on 84/10 is new in the interval no other suspicious pulmonary nodule or mass. No focal airspace consolidation. No pleural effusion. Upper Abdomen: Unremarkable. Musculoskeletal: No worrisome lytic or sclerotic osseous abnormality. Review of the MIP images  confirms the above findings. IMPRESSION: 1. No CT evidence for acute pulmonary embolus. 2.  6 mm left lower lobe pulmonary nodule, new in the interval. Non-contrast chest CT at 6-12 months is recommended. If the nodule is stable at time of repeat CT, then future CT at 18-24 months (from today's scan) is considered optional for low-risk patients, but is recommended for high-risk patients. This recommendation follows the consensus statement: Guidelines for Management of Incidental Pulmonary Nodules Detected on CT Images: From the Fleischner Society 2017; Radiology 2017; 284:228-243. 3. Tiny hiatal hernia. 4. Aortic Atherosclerosis (ICD10-I70.0). Electronically Signed   By: Misty Stanley M.D.   On: 07/03/2020 14:17   DG Chest Port 1 View  Result Date: 07/03/2020 CLINICAL DATA:  Chest pain, shortness of breath EXAM: PORTABLE CHEST 1 VIEW COMPARISON:  CTA chest dated 06/12/2018 FINDINGS: Lungs are clear.  No pleural effusion or pneumothorax. The heart is normal in size. IMPRESSION: No evidence of acute cardiopulmonary disease. Electronically Signed   By: Julian Hy M.D.   On: 07/03/2020 10:15    Procedures .Critical Care Performed by: Dorie Rank, MD Authorized by: Dorie Rank, MD   Critical care provider statement:    Critical care time (minutes):  45   Critical care was time spent personally by me on the following activities:  Discussions with consultants, evaluation of patient's response to treatment, examination of patient, ordering and performing treatments and interventions, ordering and review of laboratory studies, ordering and review of radiographic studies, pulse oximetry, re-evaluation of patient's condition, obtaining history from patient or surrogate and review of old charts     Medications Ordered in ED Medications  losartan (COZAAR) tablet 100 mg (100 mg Oral Given 07/03/20 1205)  diltiazem (CARDIZEM CD) 24 hr capsule 120 mg (120 mg Oral Given 07/03/20 1206)  carvedilol (COREG)  tablet 25 mg (25 mg Oral Given 07/03/20 1207)  furosemide (LASIX) injection 20 mg (has no administration in time range)  labetalol (NORMODYNE) injection 20 mg (has no administration in time range)  cloNIDine (CATAPRES) tablet 0.1 mg (0.1 mg Oral Given 07/03/20 1207)  morphine 4 MG/ML injection 4 mg (4 mg Intravenous Given 07/03/20 1415)  iohexol (OMNIPAQUE) 350 MG/ML injection 100 mL (65 mLs Intravenous Contrast Given 07/03/20 1355)    ED Course  I have reviewed the triage vital signs and the nursing notes.  Pertinent labs & imaging results that were available during my care of the patient were reviewed by me and considered in my medical decision making (see chart for details).  Clinical Course as of 07/03/20 1445  Mon Jul 03, 2020  1240 Patient has significant leukocytosis of 29.8 but she was started on steroids.  D-dimer slightly elevated 0.54 [JK]  1240 Pregnancy test is negative [JK]  1241 Chest x-ray negative for acute process [JK]  1416 Blood pressure is improving [JK]  1417 BNP is elevated compared to previous [JK]  1443 Blood pressure still remains elevated despite patient's oral medications.  IV labetalol ordered [JK]    Clinical Course User Index [JK] Dorie Rank, MD   MDM Rules/Calculators/A&P                         DDX HTN urgency, PE, Covid PNA, CHF Patient presented with complaints of shortness of breath in the setting of previous Covid infection.  Patient was diagnosed on the 21st.  She is vaccinated.  She is 10 days out from her diagnosis and chest x-rays not show any signs of pulmonary infiltrate.  I doubt that her symptoms are related to acute covid pneumonia.  Patient did have a slightly elevated D-dimer.  She was noted to be persistently hypertensive.  Both troponin and BNP are also elevated.  Patient does have a leukocytosis but she was put on steroids recently.  CT angiogram performed.  No evidence of PE.  Does not show any pulmonary infiltrates.  I suspect patient is  having a component of CHF and hypertensive urgency causing her symptoms.  We will need to follow her troponin but I doubt acute cardiac ischemia at this time.  Suspect is related to strain from her severe hypertension.  I have ordered additional IV labetalol top of her previously given oral home medications.  I will consult the medical service for admission and further treatment.  Final Clinical Impression(s) / ED Diagnoses Final diagnoses:  Hypertensive urgency  Acute congestive heart failure, unspecified heart failure type [facility])      Dorie Rank, MD 07/03/20 1445

## 2020-07-03 NOTE — H&P (Signed)
History and Physical    Annette Deleon  NWG:[id number]  DOB: November 29, 1975  DOA: 07/03/2020  PCP: Gregor Hams, FNP Patient coming from: home  Chief Complaint: CP,SOB  HPI:  Annette Deleon is a 45 yo female with PMH obesity, HTN, migraines who presented to the ER after she was told to present because of an abnormal EKG outpatient. She has had complaints of shortness of breath and pleuritic chest pain.  She was diagnosed with COVID-19 on 06/23/2020.  She has been vaccinated.  She is a Marine scientist for [employer]. She also has completed 2 courses of antibiotics and steroids with minimal improvement outpatient.  Her last doses were approximately Saturday, she was unsure of last doses when asked in the ER.  On work-up in the ER she was afebrile, 98.7, heart rate 115, respirations 30, blood pressure 222/119 initially, 100% on room air.  CXR was clear.  Due to mildly elevated D-dimer and pleuritic chest pain she underwent CTA chest which was negative for PE.  There was a 6 mm left lower lobe nodule which will need follow-up outpatient with repeat CT.  Given her severely elevated blood pressure which did not respond to treatment in the ER, she was admitted for further evaluation and treatment.   I have personally briefly reviewed patient's old medical records in [facility] and discussed patient with the ER provider when appropriate/indicated.  Assessment/Plan: * Hypertensive urgency -Patient states blood pressure is typically controlled at home on her regimen however she did not take medications this morning which she thinks may explain uncontrolled pressure -Home meds resumed in the ER without significant effect -Start on PRN labetalol and hydralazine and rotate to achieve control; if still unable will need cardene drip most likely  - obtain echo - last echo 05/2019: EF 55-65%, Gr 1 DD, mild LVH - trend troponin   Acute diastolic CHF (congestive heart failure) ([place]) - likely 2/2  uncontrolled BP - will work on BP control first; no obvious volume overload on exam and lungs are clear on imaging  - follow up repeat echo; history of Gr 1 DD on last echo   COVID-19 virus infection - tested positive on 1/21.  States that she has received approximately 2 courses of antibiotics and steroids with minimal improvement.  She is not hypoxic and CXR and CTA chest are clear.  She is also vaccinated.  Suspect that her pleuritic chest pain and shortness of breath is due to severely elevated blood pressure with smaller component of COVID contributing.  At this time, would prefer to target blood pressure control and monitor clinical response -Does not meet criteria for remdesivir or further steroid treatment given outpatient courses already.  Also not likely good monoclonal antibody candidate given duration of symptoms from onset -Favor supportive care and conservative management for now - check PCT and trend inflammatory markers while in hospital - leukocytosis considered demargination from steroids for now; follow up CBC in am and PCT as noted. She does not appear toxic/septic  Aortic atherosclerosis ([place]) - continue statin; no asa listed on med rec - patient underwent NM stress test 06/13/2018 (low risk study/negative for ischemia and showed "breast attenuation") - seen by cardiology on 06/23/19 (note reviewed in care everywhere): Focus was better blood pressure control regarding difficult to manage hypertension  HTN (hypertension) -On Coreg, Cardizem, HCTZ, losartan at home and PRN clonidine    Code Status: Full DVT Prophylaxis: Lovenox Anticipated disposition is to: home  History: Past Medical History:  Diagnosis Date  . Hypertension   . Migraines   . Obesity   . Pseudotumor cerebri   . Sleep apnea     Past Surgical History:  Procedure Laterality Date  . ARTHROSCOPIC REPAIR ACL    . CESAREAN SECTION    . CHOLECYSTECTOMY    . KNEE SURGERY     right  . MYOMECTOMY        reports that she has never smoked. She has never used smokeless tobacco. She reports current alcohol use. She reports that she does not use drugs.  Allergies  Allergen Reactions  . Imitrex [Sumatriptan Base] Anaphylaxis  . Prednisone Palpitations  . Wellbutrin [Bupropion] Anaphylaxis  . [place] Venom [Bee Venom] Hives  . Ibuprofen Nausea And Vomiting  . Penicillins Hives    Has patient had a PCN reaction causing immediate rash, facial/tongue/throat swelling, SOB or lightheadedness with hypotension: yes Has patient had a PCN reaction causing severe rash involving mucus membranes or skin necrosis: yes Has patient had a PCN reaction that required hospitalization: no Has patient had a PCN reaction occurring within the last 10 years: yes If all of the above answers are "NO", then may proceed with Cephalosporin use.    Family History  Problem Relation Age of Onset  . Heart attack Father 13   Home Medications: Prior to Admission medications   Medication Sig Start Date End Date Taking? Authorizing Provider  acetaminophen (TYLENOL) 500 MG tablet Take 500-1,000 mg by mouth every 6 (six) hours as needed for mild pain.   Yes [provider]  albuterol (PROVENTIL HFA;VENTOLIN HFA) 108 (90 Base) MCG/ACT inhaler Inhale 1 puff into the lungs every 6 (six) hours as needed for wheezing or shortness of breath.  11/19/16  Yes [provider]  atorvastatin (LIPITOR) 40 MG tablet Take 1 tablet (40 mg total) by mouth daily at 6 PM. 06/13/18  Yes Bonnielee Haff, MD  benzonatate (TESSALON) 200 MG capsule Take 200 mg by mouth 3 (three) times daily as needed for cough. 06/01/20  Yes [provider]  Budesonide 90 MCG/ACT inhaler Inhale 1 puff into the lungs every 6 (six) hours as needed (wheezing or shortness of breath). 12/08/19  Yes [provider]  carvedilol (COREG) 25 MG tablet Take 25 mg by mouth 2 (two) times daily. 06/24/19  Yes [provider]   cloNIDine (CATAPRES) 0.2 MG tablet Take 0.1 mg by mouth daily as needed (if systolic Blood pressure is > 160). 06/30/19  Yes [provider]  diclofenac sodium (VOLTAREN) 1 % GEL Apply 2 g topically daily as needed (pain).   Yes [provider]  diltiazem (CARDIZEM CD) 120 MG 24 hr capsule Take 120 mg by mouth daily. 05/26/20  Yes [provider]  DM-Doxylamine-Acetaminophen (NYQUIL HBP COLD & FLU) 15-6.25-325 MG/15ML LIQD Take 10 mLs by mouth every 8 (eight) hours as needed (rest or cold symptoms).   Yes [provider]  DM-Phenylephrine-Acetaminophen (VICKS DAYQUIL COLD & FLU) 10-5-325 MG/15ML LIQD Take 10 mLs by mouth every 6 (six) hours as needed (cold symptoms).   Yes [provider]  fluticasone (FLONASE) 50 MCG/ACT nasal spray Place 2 sprays into both nostrils daily as needed for allergies. 06/01/20  Yes [provider]  furosemide (LASIX) 20 MG tablet Take 20 mg by mouth daily. 09/21/19  Yes [provider]  gabapentin (NEURONTIN) 300 MG capsule Take 300 mg by mouth at bedtime as needed (pain). 01/17/20  Yes [provider]  guaiFENesin ([place]) 600  MG 12 hr tablet Take 600 mg by mouth 2 (two) times daily as needed for cough or to loosen phlegm.   Yes [provider]  hydrochlorothiazide (HYDRODIURIL) 25 MG tablet Take 25 mg by mouth daily.   Yes [provider]  Liraglutide -Weight Management 18 MG/3ML SOPN Inject 3 mLs into the skin daily.   Yes [provider]  loratadine (CLARITIN) 10 MG tablet Take 10 mg by mouth daily. 01/18/20  Yes [provider]  losartan (COZAAR) 100 MG tablet Take 100 mg by mouth daily.   Yes [provider]  predniSONE (DELTASONE) 5 MG tablet Take 5 mg by mouth daily. 01/10/20  Yes [provider]  cefdinir (OMNICEF) 300 MG capsule Take 300 mg by mouth in the morning and at bedtime. 06/22/20   [provider]    Review of Systems:   Pertinent items noted in HPI and remainder of comprehensive ROS otherwise negative.  Physical Exam: Vitals:   07/03/20 1430 07/03/20 1445 07/03/20 1500 07/03/20 1515  BP: (!) 201/113 (!) 205/129 (!) 188/111 (!) 192/111  Pulse: 71 75 71 67  Resp: 15 20 19  (!) 30  Temp:      TempSrc:      SpO2: 99% 99% 99% 98%   General appearance: alert, cooperative and no distress Head: Normocephalic, without obvious abnormality, atraumatic Eyes: EOMI Lungs: clear to auscultation bilaterally Heart: regular rate and rhythm and S1, S2 normal Abdomen: normal findings: bowel sounds normal and soft, non-tender Extremities: No edema Skin: mobility and turgor normal Neurologic: Grossly normal  Labs on Admission:  I have personally reviewed following labs and imaging studies Results for orders placed or performed during the hospital encounter of 07/03/20 (from the past 24 hour(s))  Basic metabolic panel     Status: Abnormal   Collection Time: 07/03/20 12:09 PM  Result Value Ref Range   Sodium 141 135 - 145 mmol/L   Potassium 3.9 3.5 - 5.1 mmol/L   Chloride 105 98 - 111 mmol/L   CO2 26 22 - 32 mmol/L   Glucose, Bld 164 (H) 70 - 99 mg/dL   BUN 13 6 - 20 mg/dL   Creatinine, Ser 1.12 (H) 0.44 - 1.00 mg/dL   Calcium 9.9 8.9 - 10.3 mg/dL   GFR, Estimated >60 >60 mL/min   Anion gap 10 5 - 15  CBC     Status: Abnormal   Collection Time: 07/03/20 12:09 PM  Result Value Ref Range   WBC 29.8 (H) 4.0 - 10.5 K/uL   RBC 5.28 (H) 3.87 - 5.11 MIL/uL   Hemoglobin 13.9 12.0 - 15.0 g/dL   HCT 42.4 36.0 - 46.0 %   MCV 80.3 80.0 - 100.0 fL   MCH 26.3 26.0 - 34.0 pg   MCHC 32.8 30.0 - 36.0 g/dL   RDW 14.6 11.5 - 15.5 %   Platelets 476 (H) 150 - 400 K/uL   nRBC 0.0 0.0 - 0.2 %  Troponin I (High Sensitivity)     Status: Abnormal   Collection Time: 07/03/20 12:09 PM  Result Value Ref Range   Troponin I (High Sensitivity) 31 (H) <18 ng/L  Brain natriuretic peptide     Status: Abnormal   Collection Time:  07/03/20 12:09 PM  Result Value Ref Range   B Natriuretic Peptide 455.1 (H) 0.0 - 100.0 pg/mL  D-dimer, quantitative (not at [facility])     Status: Abnormal   Collection Time: 07/03/20 12:09 PM  Result Value Ref Range  D-Dimer, Quant 0.54 (H) 0.00 - 0.50 ug/mL-FEU  I-Stat beta hCG blood, ED     Status: None   Collection Time: 07/03/20 12:23 PM  Result Value Ref Range   I-stat hCG, quantitative <5.0 <5 mIU/mL   Comment 3          Troponin I (High Sensitivity)     Status: Abnormal   Collection Time: 07/03/20  3:40 PM  Result Value Ref Range   Troponin I (High Sensitivity) 46 (H) <18 ng/L  Procalcitonin - Baseline     Status: None   Collection Time: 07/03/20  3:40 PM  Result Value Ref Range   Procalcitonin <0.10 ng/mL     Radiological Exams on Admission: CT Angio Chest PE W and/or Wo Contrast  Result Date: 07/03/2020 CLINICAL DATA:  Positive D-dimer. Clinical concern for pulmonary embolus. Chest pain and shortness of breath. EXAM: CT ANGIOGRAPHY CHEST WITH CONTRAST TECHNIQUE: Multidetector CT imaging of the chest was performed using the standard protocol during bolus administration of intravenous contrast. Multiplanar CT image reconstructions and MIPs were obtained to evaluate the vascular anatomy. CONTRAST:  23mL OMNIPAQUE IOHEXOL 350 MG/ML SOLN COMPARISON:  06/12/2018 FINDINGS: Cardiovascular: The heart size is normal. No substantial pericardial effusion. Atherosclerotic calcification is noted in the wall of the thoracic aorta. There is no filling defect within the opacified pulmonary arteries to suggest the presence of an acute pulmonary embolus. Mediastinum/Nodes: No mediastinal lymphadenopathy. Upper normal lymph nodes are seen in the right paratracheal space and subcarinal station. There is no hilar lymphadenopathy. Tiny hiatal hernia. The esophagus has normal imaging features. There is no axillary lymphadenopathy. Lungs/Pleura: 6 mm left lower lobe pulmonary nodule on 84/10 is new in the  interval no other suspicious pulmonary nodule or mass. No focal airspace consolidation. No pleural effusion. Upper Abdomen: Unremarkable. Musculoskeletal: No worrisome lytic or sclerotic osseous abnormality. Review of the MIP images confirms the above findings. IMPRESSION: 1. No CT evidence for acute pulmonary embolus. 2. 6 mm left lower lobe pulmonary nodule, new in the interval. Non-contrast chest CT at 6-12 months is recommended. If the nodule is stable at time of repeat CT, then future CT at 18-24 months (from today's scan) is considered optional for low-risk patients, but is recommended for high-risk patients. This recommendation follows the consensus statement: Guidelines for Management of Incidental Pulmonary Nodules Detected on CT Images: From the Fleischner Society 2017; Radiology 2017; 284:228-243. 3. Tiny hiatal hernia. 4. Aortic Atherosclerosis (ICD10-I70.0). Electronically Signed   By: Misty Stanley M.D.   On: 07/03/2020 14:17   DG Chest Port 1 View  Result Date: 07/03/2020 CLINICAL DATA:  Chest pain, shortness of breath EXAM: PORTABLE CHEST 1 VIEW COMPARISON:  CTA chest dated 06/12/2018 FINDINGS: Lungs are clear.  No pleural effusion or pneumothorax. The heart is normal in size. IMPRESSION: No evidence of acute cardiopulmonary disease. Electronically Signed   By: Julian Hy M.D.   On: 07/03/2020 10:15   CT Angio Chest PE W and/or Wo Contrast  Final Result    DG Chest [facility] 1 View  Final Result      Consults called:  n/a   EKG: unable to review in epic but negative for ischemic signs when discussed with EDP   Dwyane Dee, MD [facility] 07/03/2020, 6:06 PM

## 2020-07-04 ENCOUNTER — Inpatient Hospital Stay (HOSPITAL_COMMUNITY): Payer: Managed Care, Other (non HMO)

## 2020-07-04 ENCOUNTER — Other Ambulatory Visit: Payer: Self-pay

## 2020-07-04 DIAGNOSIS — I5031 Acute diastolic (congestive) heart failure: Secondary | ICD-10-CM

## 2020-07-04 DIAGNOSIS — U071 COVID-19: Secondary | ICD-10-CM

## 2020-07-04 DIAGNOSIS — I700 Atherosclerosis of aorta: Secondary | ICD-10-CM

## 2020-07-04 DIAGNOSIS — I10 Essential (primary) hypertension: Secondary | ICD-10-CM

## 2020-07-04 LAB — BASIC METABOLIC PANEL
Anion gap: 15 (ref 5–15)
BUN: 15 mg/dL (ref 6–20)
CO2: 23 mmol/L (ref 22–32)
Calcium: 9.3 mg/dL (ref 8.9–10.3)
Chloride: 102 mmol/L (ref 98–111)
Creatinine, Ser: 1.13 mg/dL — ABNORMAL HIGH (ref 0.44–1.00)
GFR, Estimated: >60 mL/min (ref >60)
Glucose, Bld: 185 mg/dL — ABNORMAL HIGH (ref 70–99)
Potassium: 4 mmol/L (ref 3.5–5.1)
Sodium: 140 mmol/L (ref 135–145)

## 2020-07-04 LAB — CBC WITH DIFFERENTIAL/PLATELET
Abs Immature Granulocytes: 0.17 10*3/uL — ABNORMAL HIGH (ref 0.00–0.07)
Basophils Absolute: 0.1 10*3/uL (ref 0.0–0.1)
Basophils Relative: 0 %
Eosinophils Absolute: 0 10*3/uL (ref 0.0–0.5)
Eosinophils Relative: 0 %
HCT: 39.7 % (ref 36.0–46.0)
Hemoglobin: 12.7 g/dL (ref 12.0–15.0)
Immature Granulocytes: 1 %
Lymphocytes Relative: 13 %
Lymphs Abs: 3.6 10*3/uL (ref 0.7–4.0)
MCH: 26.2 pg (ref 26.0–34.0)
MCHC: 32 g/dL (ref 30.0–36.0)
MCV: 81.9 fL (ref 80.0–100.0)
Monocytes Absolute: 1.2 10*3/uL — ABNORMAL HIGH (ref 0.1–1.0)
Monocytes Relative: 4 %
Neutro Abs: 22.4 10*3/uL — ABNORMAL HIGH (ref 1.7–7.7)
Neutrophils Relative %: 82 %
Platelets: 448 10*3/uL — ABNORMAL HIGH (ref 150–400)
RBC: 4.85 MIL/uL (ref 3.87–5.11)
RDW: 14.9 % (ref 11.5–15.5)
WBC: 27.6 10*3/uL — ABNORMAL HIGH (ref 4.0–10.5)
nRBC: 0 % (ref 0.0–0.2)

## 2020-07-04 LAB — ECHOCARDIOGRAM LIMITED
AV Peak grad: 11.7 mmHg
Ao pk vel: 1.71 m/s
Area-P 1/2: 5.84 cm2
Height: 63 in
S' Lateral: 2.3 cm
Weight: 4433.89 oz

## 2020-07-04 LAB — TROPONIN I (HIGH SENSITIVITY)
Troponin I (High Sensitivity): 27 ng/L — ABNORMAL HIGH (ref <18)
Troponin I (High Sensitivity): 34 ng/L — ABNORMAL HIGH (ref <18)
Troponin I (High Sensitivity): 33 ng/L — ABNORMAL HIGH (ref <18)

## 2020-07-04 LAB — CORTISOL-AM, BLOOD: Cortisol - AM: 0.5 ug/dL — ABNORMAL LOW (ref 6.7–22.6)

## 2020-07-04 LAB — PROCALCITONIN: Procalcitonin: <0.1 ng/mL

## 2020-07-04 LAB — C-REACTIVE PROTEIN
CRP: 0.8 mg/dL (ref <1.0)
CRP: 0.8 mg/dL (ref <1.0)

## 2020-07-04 LAB — LACTATE DEHYDROGENASE: LDH: 136 U/L (ref 98–192)

## 2020-07-04 LAB — HIV ANTIBODY (ROUTINE TESTING W REFLEX): HIV Screen 4th Generation wRfx: NONREACTIVE

## 2020-07-04 LAB — MAGNESIUM: Magnesium: 2 mg/dL (ref 1.7–2.4)

## 2020-07-04 LAB — FERRITIN: Ferritin: 10 ng/mL — ABNORMAL LOW (ref 11–307)

## 2020-07-04 LAB — D-DIMER, QUANTITATIVE: D-Dimer, Quant: 0.38 ug/mL-FEU (ref 0.00–0.50)

## 2020-07-04 MED ORDER — PANTOPRAZOLE SODIUM 40 MG PO TBEC
40.0000 mg | DELAYED_RELEASE_TABLET | Freq: Every day | ORAL | Status: DC
  Administered 2020-07-04 – 2020-07-05 (×2): 40 mg via ORAL
  Filled 2020-07-04 (×2): qty 1

## 2020-07-04 MED ORDER — MORPHINE SULFATE (PF) 2 MG/ML IV SOLN
2.0000 mg | Freq: Once | INTRAVENOUS | Status: AC
  Administered 2020-07-04: 2 mg via INTRAVENOUS
  Filled 2020-07-04: qty 1

## 2020-07-04 MED ORDER — GUAIFENESIN-DM 100-10 MG/5ML PO SYRP: 5.0000 mL | ORAL_SOLUTION | ORAL | Status: DC | PRN

## 2020-07-04 MED ORDER — CELECOXIB 100 MG PO CAPS
100.0000 mg | ORAL_CAPSULE | Freq: Two times a day (BID) | ORAL | Status: DC
  Administered 2020-07-04 – 2020-07-05 (×2): 100 mg via ORAL
  Filled 2020-07-04 (×2): qty 1

## 2020-07-04 MED ORDER — HYDROCOD POLST-CPM POLST ER 10-8 MG/5ML PO SUER
5.0000 mL | Freq: Two times a day (BID) | ORAL | Status: DC
Start: 2020-07-04 — End: 2020-07-05
  Administered 2020-07-04 – 2020-07-05 (×2): 5 mL via ORAL
  Filled 2020-07-04 (×2): qty 5

## 2020-07-04 MED ORDER — ACETAMINOPHEN 325 MG PO TABS
650.0000 mg | ORAL_TABLET | Freq: Four times a day (QID) | ORAL | Status: DC
  Administered 2020-07-04 – 2020-07-05 (×2): 650 mg via ORAL
  Filled 2020-07-04 (×2): qty 2

## 2020-07-04 MED ORDER — MORPHINE SULFATE (PF) 2 MG/ML IV SOLN
1.0000 mg | INTRAVENOUS | Status: DC | PRN
  Administered 2020-07-04 – 2020-07-05 (×2): 1 mg via INTRAVENOUS
  Filled 2020-07-04 (×3): qty 1

## 2020-07-04 NOTE — Progress Notes (Signed)
PROGRESS NOTE    SKYLEY GRANDMAISON  KGU:[id number] DOB: [birth year] DOA: 07/03/2020 PCP: Gregor Hams, FNP    Brief Narrative:  Mrs. Westrich was admitted to the hospital with working diagnosis of hypertensive urgency, in the setting of SARS COVID-19 viral infection.  45 year old female vaccinated for COVID-19 with past medical history for obesity class III, hypertension and migraines. She was diagnosed with COVID-19 on 06/23/2020, as an outpatient she was treated with antibiotics and steroids with no significant improvement of his symptoms. She had dyspnea and pleuritic chest pain. She was told to have an abnormal EKG and was told to come to the hospital. On her initial physical examination she was afebrile, temperature 98.7, heart rate 115, respirate 30, blood pressure 222/119, oxygen saturation 100%. Her lungs are clear to auscultation bilaterally, heart S1-S2, present rhythmic, soft abdomen, no lower extremity edema.  Sodium 141, potassium 3.9, chloride 105, bicarb 26, glucose 164, BUN 13, creatinine 1.12, white count 29.8, hemoglobin 13.9, hematocrit 42.4, platelets 476. Chest radiograph had mild atelectasis bilaterally at bases. CT chest with no pulmonary embolism, 6 mm left lower lobe pulmonary nodule. No infiltrates.  EKG 92 bpm, normal axis, normal intervals, sinus rhythm, positive LVH, V4-V6 ST segment depression and T wave versions.  Assessment & Plan:   Principal Problem:   Hypertensive urgency Active Problems:   HTN (hypertension)   Aortic atherosclerosis ([facility])   COVID-19 virus infection   Acute diastolic CHF (congestive heart failure) ([place])   1. Hypertensive urgency. Blood pressure 141/68 mmHg this am.  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  Follow up on echocardiogram. Continue blood pressure control with carvedilol, diltiazem, and losartan.   2.  Diastolic heart failure. No clinical signs of decompensation, continue blood pressure control. Follow up with echocardiogram.   3. Atherosclerosis/ obesity class 3. Continue with aspirin and statin therapy.  Calculated BMI is 49.09  4. Positive COVID viral infection. Initial diagnosis on 01/21, she continue to have generalized weakness, cough and diarrhea.  Chest pain seems to be musculoskeletal due to cough.   COVID-19 Labs  Recent Labs    07/03/20 1209 07/04/20 0159  DDIMER 0.54* 0.38  FERRITIN  --  10*  LDH  --  136  CRP  --  0.8  0.8    No results found for: SARSCOV2NAA   Low d dimer and negative CT chest for PE.  Add bid celocoxib for musculoskeletal pain and continue with antitussive agents. Change acetaminophen to scheduled qid.  Not in the window for remdesivir. Hold on steroids due to low inflammatory markers. No clinical signs of viral pneumonia.      Status is: Inpatient  Remains inpatient appropriate because:Inpatient level of care appropriate due to severity of illness   Dispo: The patient is from: Home              Anticipated d/c is to: Home              Anticipated d/c date is: 1 day              Patient currently is not medically stable to d/c. Possible dc home in am if continue to improve.    Difficult to place patient No   DVT prophylaxis: Enoxaparin   Code Status:    full  Family Communication:  No family at the bedside        Subjective: Patient continue to have persistent chest pain,  generalized weakness and malaise.  No nausea or vomiting,   Objective: Vitals:   07/03/20 2200 07/03/20 2244 07/04/20 0137 07/04/20 1300  BP: (!) 175/104 (!) 131/116 (!) 165/97 (!) 141/68  Pulse: 66 67 81 78  Resp: 14 18 20  (!) 22  Temp:  98.2 F (36.8 C)  (!) 97.1 F (36.2 C)  TempSrc:  Oral  Oral  SpO2: 100% 100% 100%   Weight:  125.7 kg    Height:  5\' 3"  (1.6 m)     No intake or output data in the 24 hours ending 07/04/20 1402 Filed Weights    07/03/20 2013 07/03/20 2244  Weight: 127.2 kg 125.7 kg    Examination:   General: Not in pain or dyspnea, deconditioned  Neurology: Awake and alert, non focal  E ENT: mild pallor, no icterus, oral mucosa moist Cardiovascular: No JVD. S1-S2 present, rhythmic, no gallops, rubs, or murmurs. No lower extremity edema. Pulmonary: positive breath sounds bilaterally, with no wheezing or rales.  Gastrointestinal. Abdomen soft and non tender Skin. No rashes Musculoskeletal: no joint deformities     Data Reviewed: I have personally reviewed following labs and imaging studies  CBC: Recent Labs  Lab 07/03/20 1209 07/04/20 0159  WBC 29.8* 27.6*  NEUTROABS  --  22.4*  HGB 13.9 12.7  HCT 42.4 39.7  MCV 80.3 81.9  PLT 476* 518*   Basic Metabolic Panel: Recent Labs  Lab 07/03/20 1209 07/04/20 0159  NA 141 140  K 3.9 4.0  CL 105 102  CO2 26 23  GLUCOSE 164* 185*  BUN 13 15  CREATININE 1.12* 1.13*  CALCIUM 9.9 9.3  MG  --  2.0   GFR: Estimated Creatinine Clearance: 80.2 mL/min (A) (by C-G formula based on SCr of 1.13 mg/dL (H)). Liver Function Tests: No results for input(s): AST, ALT, ALKPHOS, BILITOT, PROT, ALBUMIN in the last 168 hours. No results for input(s): LIPASE, AMYLASE in the last 168 hours. No results for input(s): AMMONIA in the last 168 hours. Coagulation Profile: No results for input(s): INR, PROTIME in the last 168 hours. Cardiac Enzymes: No results for input(s): CKTOTAL, CKMB, CKMBINDEX, TROPONINI in the last 168 hours. BNP (last 3 results) No results for input(s): PROBNP in the last 8760 hours. HbA1C: No results for input(s): HGBA1C in the last 72 hours. CBG: No results for input(s): GLUCAP in the last 168 hours. Lipid Profile: No results for input(s): CHOL, HDL, LDLCALC, TRIG, CHOLHDL, LDLDIRECT in the last 72 hours. Thyroid Function Tests: No results for input(s): TSH, T4TOTAL, FREET4, T3FREE, THYROIDAB in the last 72 hours. Anemia Panel: Recent Labs     07/04/20 0159  FERRITIN 10*      Radiology Studies: I have reviewed all of the imaging during this hospital visit personally     Scheduled Meds: . atorvastatin  40 mg Oral q1800  . carvedilol  25 mg Oral BID  . diltiazem  120 mg Oral Daily  . enoxaparin (LOVENOX) injection  40 mg Subcutaneous Q24H  . loratadine  10 mg Oral Daily  . losartan  100 mg Oral Daily  . sodium chloride flush  3 mL Intravenous Q12H   Continuous Infusions:   LOS: 1 day        Mauricio Gerome Apley, MD

## 2020-07-04 NOTE — Progress Notes (Signed)
Patient had several episodes of chest pain unrelieved by the one time does of morphine; EKG was performed, provider on call is aware; patient stated she needs something for her pain during her stay.

## 2020-07-04 NOTE — Progress Notes (Signed)
Pt had 11 beats of Vtach this morning. Pt is sleeping comfortably. MD notified and aware. Will cont to monitor.

## 2020-07-04 NOTE — Progress Notes (Signed)
  Echocardiogram 2D Echocardiogram has been performed.  Annette Deleon 07/04/2020, 12:16 PM

## 2020-07-05 DIAGNOSIS — I160 Hypertensive urgency: Principal | ICD-10-CM

## 2020-07-05 LAB — BASIC METABOLIC PANEL
Anion gap: 10 (ref 5–15)
BUN: 25 mg/dL — ABNORMAL HIGH (ref 6–20)
CO2: 25 mmol/L (ref 22–32)
Calcium: 8.8 mg/dL — ABNORMAL LOW (ref 8.9–10.3)
Chloride: 105 mmol/L (ref 98–111)
Creatinine, Ser: 1.33 mg/dL — ABNORMAL HIGH (ref 0.44–1.00)
GFR, Estimated: 50 mL/min — ABNORMAL LOW (ref >60)
Glucose, Bld: 115 mg/dL — ABNORMAL HIGH (ref 70–99)
Potassium: 4.3 mmol/L (ref 3.5–5.1)
Sodium: 140 mmol/L (ref 135–145)

## 2020-07-05 NOTE — Plan of Care (Signed)

## 2020-07-05 NOTE — Discharge Summary (Signed)
Physician Discharge Summary  Annette Deleon [id number] DOB: 1975/12/07 DOA: 07/03/2020  PCP: Annette Hams, FNP  Admit date: 07/03/2020 Discharge date: 07/05/2020  Admitted From: Home Disposition: Home  Recommendations for Outpatient Follow-up:  1. Follow up with PCP in 1-2 weeks 2. Resume home carvedilol, diltiazem, furosemide, HCTZ and losartan 3. Please obtain BMP in one week 4. Patient struck to the brain blood pressure log to next PCP visit. 5. Recommend repeat CT chest 6/12 months for further surveillance of left lower lobe pulmonary nodule found incidentally on CT angiogram chest.  Home Health: No Equipment/Devices: None  Discharge Condition: Stable CODE STATUS: Full code Diet recommendation: Heart healthy diet  History of present illness:  Annette Deleon is a 45 year old female with past medical history significant for obesity, essential hypertension, migraine headache, recent Covid-19 viral infection on 06/23/2020 in which she was treated outpatient with antibiotics and steroids who presented to the ED with shortness of breath, pleuritic chest pain.  In the ED, temperature 98.7, HR 115, RR 30, BP 222/119, SPO2 100% on room air.  Sodium 140, potassium 4.0, chloride 102, glucose 185, CO2 23, BUN 15, creatinine 1.13, magnesium 2.0.  Troponin XX 7.  CRP 0.8.  Procalcitonin less than 0.10.  WBC 27.6, hemoglobin 12.7, platelets 448.  D-dimer 0.38. chest x-ray unrevealing.  CT angiogram negative for pulmonary embolism, but does note incidental finding of 6 mm left lower lobe pulmonary nodule.  EKG with heart rate 92, normal axis, normal intervals, normal sinus rhythm with LVH.  Given patient's elevated blood pressure, hospital service consulted for further evaluation and management of hypertensive urgency.   Hospital course:  Hypertensive urgency Patient presenting with elevated blood pressure of 222/119.  Her home medications the day of ED presentation, and relates this may be  the reason why her blood pressure is so elevated.  Patient was restarted on her home medications slowly to include carvedilol 25 mg twice daily, diltiazem 120 mg p.o. daily, losartan 100 mg p.o. daily with improvement of her blood pressure.  Will also resume her home HCTZ and furosemide on discharge.  Outpatient follow-up with PCP.  Patient instructed to maintain blood pressure log to bring to next PCP visit.  Left lower lobe pulmonary nodule Incidental finding on CT angiogram chest of 6 mm left lower lobe pulmonary nodule.  Recommend follow-up CT in 6-12 months.  Chronic diastolic congestive heart failure TTE with LVEF 70-75%, moderate LVH, grade 2 diastolic dysfunction.  Continue beta-blocker, ARB, furosemide.  Daily weights.  Obesity class III Body mass index is 51.78 kg/m.  Discussed with patient needs for aggressive lifestyle changes/weight loss as this complicates all facets of care.  History of Covid-19 viral infection. Initially diagnosed on 06/23/2020.  Treated outpatient with course of steroids antibiotics.  Patient oxygenating well on room air and relatively asymptomatic other than some musculoskeletal pains.  Off of isolation.   Discharge Diagnoses:  Active Problems:   HTN (hypertension)   Aortic atherosclerosis ([place])   COVID-19 virus infection   Acute diastolic CHF (congestive heart failure) [facility])    Discharge Instructions  Discharge Instructions    Call MD for:  difficulty breathing, headache or visual disturbances   Complete by: As directed    Call MD for:  extreme fatigue   Complete by: As directed    Call MD for:  persistant dizziness or light-headedness   Complete by: As directed    Call MD for:  persistant nausea and vomiting   Complete by: As directed  Call MD for:  severe uncontrolled pain   Complete by: As directed    Call MD for:  temperature >100.4   Complete by: As directed    Diet - low sodium heart healthy   Complete by: As directed    Increase  activity slowly   Complete by: As directed      Allergies as of 07/05/2020      Reactions   Imitrex [sumatriptan Base] Anaphylaxis   Prednisone Palpitations   Wellbutrin [bupropion] Anaphylaxis   Yellow Jacket Venom [bee Venom] Hives   Ibuprofen Nausea And Vomiting   Penicillins Hives   Has patient had a PCN reaction causing immediate rash, facial/tongue/throat swelling, SOB or lightheadedness with hypotension: yes Has patient had a PCN reaction causing severe rash involving mucus membranes or skin necrosis: yes Has patient had a PCN reaction that required hospitalization: no Has patient had a PCN reaction occurring within the last 10 years: yes If all of the above answers are "NO", then may proceed with Cephalosporin use.      Medication List    STOP taking these medications   cefdinir 300 MG capsule Commonly known as: OMNICEF   predniSONE 5 MG tablet Commonly known as: DELTASONE     TAKE these medications   acetaminophen 500 MG tablet Commonly known as: TYLENOL Take 500-1,000 mg by mouth every 6 (six) hours as needed for mild pain.   albuterol 108 (90 Base) MCG/ACT inhaler Commonly known as: VENTOLIN HFA Inhale 1 puff into the lungs every 6 (six) hours as needed for wheezing or shortness of breath.   atorvastatin 40 MG tablet Commonly known as: LIPITOR Take 1 tablet (40 mg total) by mouth daily at 6 PM.   benzonatate 200 MG capsule Commonly known as: TESSALON Take 200 mg by mouth 3 (three) times daily as needed for cough.   Budesonide 90 MCG/ACT inhaler Inhale 1 puff into the lungs every 6 (six) hours as needed (wheezing or shortness of breath).   carvedilol 25 MG tablet Commonly known as: COREG Take 25 mg by mouth 2 (two) times daily.   cloNIDine 0.2 MG tablet Commonly known as: CATAPRES Take 0.1 mg by mouth daily as needed (if systolic Blood pressure is > 160).   diclofenac sodium 1 % Gel Commonly known as: VOLTAREN Apply 2 g topically daily as needed  (pain).   diltiazem 120 MG 24 hr capsule Commonly known as: CARDIZEM CD Take 120 mg by mouth daily.   fluticasone 50 MCG/ACT nasal spray Commonly known as: FLONASE Place 2 sprays into both nostrils daily as needed for allergies.   furosemide 20 MG tablet Commonly known as: LASIX Take 20 mg by mouth daily.   gabapentin 300 MG capsule Commonly known as: NEURONTIN Take 300 mg by mouth at bedtime as needed (pain).   guaiFENesin 600 MG 12 hr tablet Commonly known as: MUCINEX Take 600 mg by mouth 2 (two) times daily as needed for cough or to loosen phlegm.   hydrochlorothiazide 25 MG tablet Commonly known as: HYDRODIURIL Take 25 mg by mouth daily.   Liraglutide -Weight Management 18 MG/3ML Sopn Inject 3 mLs into the skin daily.   loratadine 10 MG tablet Commonly known as: CLARITIN Take 10 mg by mouth daily.   losartan 100 MG tablet Commonly known as: COZAAR Take 100 mg by mouth daily.   NyQuil HBP Cold & Flu 15-6.25-325 MG/15ML Liqd Generic drug: DM-Doxylamine-Acetaminophen Take 10 mLs by mouth every 8 (eight) hours as needed (rest or cold  symptoms).   Vicks DayQuil Cold & Flu 10-5-325 MG/15ML Liqd Generic drug: DM-Phenylephrine-Acetaminophen Take 10 mLs by mouth every 6 (six) hours as needed (cold symptoms).       Follow-up Information    Annette Hams, FNP. Schedule an appointment as soon as possible for a visit in 1 week(s).   Specialty: Family Medicine Contact information: [place] [address] [phone]              Allergies  Allergen Reactions  . Imitrex [Sumatriptan Base] Anaphylaxis  . Prednisone Palpitations  . Wellbutrin [Bupropion] Anaphylaxis  . Yellow Jacket Venom [Bee Venom] Hives  . Ibuprofen Nausea And Vomiting  . Penicillins Hives    Has patient had a PCN reaction causing immediate rash, facial/tongue/throat swelling, SOB or lightheadedness with hypotension: yes Has patient had a PCN reaction causing  severe rash involving mucus membranes or skin necrosis: yes Has patient had a PCN reaction that required hospitalization: no Has patient had a PCN reaction occurring within the last 10 years: yes If all of the above answers are "NO", then may proceed with Cephalosporin use.    Consultations:  None   Procedures/Studies: CT ANGIO HEAD W OR WO CONTRAST  Result Date: 06/11/2020 CLINICAL DATA:  Left-sided weakness.  Diabetes. EXAM: CT ANGIOGRAPHY HEAD AND NECK TECHNIQUE: Multidetector CT imaging of the head and neck was performed using the standard protocol during bolus administration of intravenous contrast. Multiplanar CT image reconstructions and MIPs were obtained to evaluate the vascular anatomy. Carotid stenosis measurements (when applicable) are obtained utilizing NASCET criteria, using the distal internal carotid diameter as the denominator. CONTRAST:  42mL OMNIPAQUE IOHEXOL 350 MG/ML SOLN COMPARISON:  Head CT same day FINDINGS: CTA NECK FINDINGS Aortic arch: Aortic atherosclerosis. Branching pattern is normal without origin stenosis. Right carotid system: Common carotid artery widely patent to the bifurcation. No carotid bifurcation stenosis or irregularity. Minimal atherosclerotic plaque. Left carotid system: Common carotid artery widely patent to the bifurcation. Minimal atherosclerotic plaque at the bifurcation but no stenosis. Vertebral arteries: Both vertebral arteries are widely patent at their origins and through the cervical region. Some motion degradation, but no suspicion of significant disease. Skeleton: Normal Other neck: No mass or lymphadenopathy. Upper chest: Negative Review of the MIP images confirms the above findings CTA HEAD FINDINGS Anterior circulation: Both internal carotid arteries are patent through the skull base and siphon regions. The anterior and middle cerebral vessels are patent. No large or medium vessel occlusion. Posterior circulation: Both vertebral arteries widely  patent to the basilar. No basilar stenosis. Posterior circulation branch vessels are normal. Venous sinuses: Venous sinuses are patent. Transverse sinuses are diminutive, particularly on the right, but I do not see evidence of venous thrombosis. Anatomic variants: None significant. Review of the MIP images confirms the above findings IMPRESSION: 1. No large or medium vessel occlusion. 2. Minimal atherosclerotic change at both carotid bifurcations but without stenosis. Tortuous vessels suggesting a history of hypertension. 3. These results were communicated to Xu at 3:38 pmon 1/9/2022by text page via the [facility] messaging system. Aortic Atherosclerosis (ICD10-I70.0). Electronically Signed   By: Nelson Chimes M.D.   On: 06/11/2020 15:39   CT ANGIO NECK W OR WO CONTRAST  Result Date: 06/11/2020 CLINICAL DATA:  Left-sided weakness.  Diabetes. EXAM: CT ANGIOGRAPHY HEAD AND NECK TECHNIQUE: Multidetector CT imaging of the head and neck was performed using the standard protocol during bolus administration of intravenous contrast. Multiplanar CT image reconstructions and MIPs were obtained to  evaluate the vascular anatomy. Carotid stenosis measurements (when applicable) are obtained utilizing NASCET criteria, using the distal internal carotid diameter as the denominator. CONTRAST:  81mL OMNIPAQUE IOHEXOL 350 MG/ML SOLN COMPARISON:  Head CT same day FINDINGS: CTA NECK FINDINGS Aortic arch: Aortic atherosclerosis. Branching pattern is normal without origin stenosis. Right carotid system: Common carotid artery widely patent to the bifurcation. No carotid bifurcation stenosis or irregularity. Minimal atherosclerotic plaque. Left carotid system: Common carotid artery widely patent to the bifurcation. Minimal atherosclerotic plaque at the bifurcation but no stenosis. Vertebral arteries: Both vertebral arteries are widely patent at their origins and through the cervical region. Some motion degradation, but no suspicion of  significant disease. Skeleton: Normal Other neck: No mass or lymphadenopathy. Upper chest: Negative Review of the MIP images confirms the above findings CTA HEAD FINDINGS Anterior circulation: Both internal carotid arteries are patent through the skull base and siphon regions. The anterior and middle cerebral vessels are patent. No large or medium vessel occlusion. Posterior circulation: Both vertebral arteries widely patent to the basilar. No basilar stenosis. Posterior circulation branch vessels are normal. Venous sinuses: Venous sinuses are patent. Transverse sinuses are diminutive, particularly on the right, but I do not see evidence of venous thrombosis. Anatomic variants: None significant. Review of the MIP images confirms the above findings IMPRESSION: 1. No large or medium vessel occlusion. 2. Minimal atherosclerotic change at both carotid bifurcations but without stenosis. Tortuous vessels suggesting a history of hypertension. 3. These results were communicated to Xu at 3:38 pmon 1/9/2022by text page via the [facility] messaging system. Aortic Atherosclerosis (ICD10-I70.0). Electronically Signed   By: Nelson Chimes M.D.   On: 06/11/2020 15:39   CT Angio Chest PE W and/or Wo Contrast  Result Date: 07/03/2020 CLINICAL DATA:  Positive D-dimer. Clinical concern for pulmonary embolus. Chest pain and shortness of breath. EXAM: CT ANGIOGRAPHY CHEST WITH CONTRAST TECHNIQUE: Multidetector CT imaging of the chest was performed using the standard protocol during bolus administration of intravenous contrast. Multiplanar CT image reconstructions and MIPs were obtained to evaluate the vascular anatomy. CONTRAST:  52mL OMNIPAQUE IOHEXOL 350 MG/ML SOLN COMPARISON:  06/12/2018 FINDINGS: Cardiovascular: The heart size is normal. No substantial pericardial effusion. Atherosclerotic calcification is noted in the wall of the thoracic aorta. There is no filling defect within the opacified pulmonary arteries to suggest the presence  of an acute pulmonary embolus. Mediastinum/Nodes: No mediastinal lymphadenopathy. Upper normal lymph nodes are seen in the right paratracheal space and subcarinal station. There is no hilar lymphadenopathy. Tiny hiatal hernia. The esophagus has normal imaging features. There is no axillary lymphadenopathy. Lungs/Pleura: 6 mm left lower lobe pulmonary nodule on 84/10 is new in the interval no other suspicious pulmonary nodule or mass. No focal airspace consolidation. No pleural effusion. Upper Abdomen: Unremarkable. Musculoskeletal: No worrisome lytic or sclerotic osseous abnormality. Review of the MIP images confirms the above findings. IMPRESSION: 1. No CT evidence for acute pulmonary embolus. 2. 6 mm left lower lobe pulmonary nodule, new in the interval. Non-contrast chest CT at 6-12 months is recommended. If the nodule is stable at time of repeat CT, then future CT at 18-24 months (from today's scan) is considered optional for low-risk patients, but is recommended for high-risk patients. This recommendation follows the consensus statement: Guidelines for Management of Incidental Pulmonary Nodules Detected on CT Images: From the Fleischner Society 2017; Radiology 2017; 284:228-243. 3. Tiny hiatal hernia. 4. Aortic Atherosclerosis (ICD10-I70.0). Electronically Signed   By: Misty Stanley M.D.   On: 07/03/2020 14:17  DG Chest Port 1 View  Result Date: 07/03/2020 CLINICAL DATA:  Chest pain, shortness of breath EXAM: PORTABLE CHEST 1 VIEW COMPARISON:  CTA chest dated 06/12/2018 FINDINGS: Lungs are clear.  No pleural effusion or pneumothorax. The heart is normal in size. IMPRESSION: No evidence of acute cardiopulmonary disease. Electronically Signed   By: Julian Hy M.D.   On: 07/03/2020 10:15   CT HEAD CODE STROKE WO CONTRAST  Result Date: 06/11/2020 CLINICAL DATA:  Code stroke.  Left-sided weakness. EXAM: CT HEAD WITHOUT CONTRAST TECHNIQUE: Contiguous axial images were obtained from the base of the  skull through the vertex without intravenous contrast. COMPARISON:  02/26/2017 FINDINGS: Brain: Normal appearance without evidence of old or acute infarction, mass lesion, hemorrhage, hydrocephalus or extra-axial collection. Vascular: No abnormal vascular finding. Skull: Normal Sinuses/Orbits: Clear/normal Other: None ASPECTS ([place] Stroke Program Early CT Score) - Ganglionic level infarction (caudate, lentiform nuclei, internal capsule, insula, M1-M3 cortex): 7 - Supraganglionic infarction (M4-M6 cortex): 3 Total score (0-10 with 10 being normal): 10 IMPRESSION: 1. Normal head CT. 2. ASPECTS is 10. 3. These results were communicated to Xu at 3:03 pmon 1/9/2022by text page via the [facility] messaging system. Electronically Signed   By: Nelson Chimes M.D.   On: 06/11/2020 15:04   ECHOCARDIOGRAM LIMITED  Result Date: 07/04/2020    ECHOCARDIOGRAM LIMITED REPORT   Patient Name:   Annette Deleon Date of Exam: 07/04/2020 Medical Rec #:  [id number]      Height:       63.0 in Accession #:    [id number]     Weight:       277.1 lb Date of Birth:  April 01, 1976      BSA:          2.221 m Patient Age:    26 years       BP:           165/97 mmHg Patient Gender: F              HR:           87 bpm. Exam Location:  Inpatient Procedure: Limited Echo, Cardiac Doppler and Color Doppler Indications:    CHF  History:        Patient has no prior history of Echocardiogram examinations.                 Arrythmias:abnormal EKG, Signs/Symptoms:Chest Pain and Shortness                 of Breath; Risk Factors:Hypertension and morbid obesity.  Sonographer:    Dustin Flock Referring Phys: [place]  Sonographer Comments: Patient is morbidly obese. Image acquisition challenging due to patient body habitus. COVID+ IMPRESSIONS  1. Left ventricular ejection fraction, by estimation, is 70 to 75%. The left ventricle has hyperdynamic function. The left ventricle has no regional wall motion abnormalities. There is moderate concentric left  ventricular hypertrophy. Left ventricular diastolic parameters are consistent with Grade II diastolic dysfunction (pseudonormalization). Elevated left atrial pressure.  2. Right ventricular systolic function is normal. The right ventricular size is normal.  3. Left atrial size was moderately dilated.  4. The mitral valve is normal in structure. Moderate mitral valve regurgitation. No evidence of mitral stenosis.  5. The aortic valve is normal in structure. Aortic valve regurgitation is not visualized. No aortic stenosis is present.  6. The inferior vena cava is normal in size with greater than 50% respiratory variability, suggesting right atrial pressure of 3 mmHg.  FINDINGS  Left Ventricle: Left ventricular ejection fraction, by estimation, is 70 to 75%. The left ventricle has hyperdynamic function. The left ventricle has no regional wall motion abnormalities. The left ventricular internal cavity size was normal in size. There is moderate concentric left ventricular hypertrophy. Left ventricular diastolic parameters are consistent with Grade II diastolic dysfunction (pseudonormalization). Elevated left atrial pressure. Right Ventricle: The right ventricular size is normal. No increase in right ventricular wall thickness. Right ventricular systolic function is normal. Left Atrium: Left atrial size was moderately dilated. Right Atrium: Right atrial size was normal in size. Pericardium: There is no evidence of pericardial effusion. Mitral Valve: The mitral valve is normal in structure. Moderate mitral valve regurgitation. No evidence of mitral valve stenosis. Tricuspid Valve: The tricuspid valve is normal in structure. Tricuspid valve regurgitation is not demonstrated. No evidence of tricuspid stenosis. Aortic Valve: The aortic valve is normal in structure. Aortic valve regurgitation is not visualized. No aortic stenosis is present. Aortic valve peak gradient measures 11.7 mmHg. Pulmonic Valve: The pulmonic valve was  normal in structure. Pulmonic valve regurgitation is not visualized. No evidence of pulmonic stenosis. Aorta: The aortic root is normal in size and structure. Venous: The inferior vena cava is normal in size with greater than 50% respiratory variability, suggesting right atrial pressure of 3 mmHg. IAS/Shunts: No atrial level shunt detected by color flow Doppler. LEFT VENTRICLE PLAX 2D LVIDd:         3.80 cm  Diastology LVIDs:         2.30 cm  LV e' medial:    5.77 cm/s LV PW:         1.70 cm  LV E/e' medial:  13.1 LV IVS:        1.70 cm  LV e' lateral:   6.42 cm/s LVOT diam:     2.50 cm  LV E/e' lateral: 11.8 LVOT Area:     4.91 cm  RIGHT VENTRICLE RV S prime:     8.81 cm/s LEFT ATRIUM         Index LA diam:    4.70 cm 2.12 cm/m  AORTIC VALVE AV Vmax:      171.00 cm/s AV Peak Grad: 11.7 mmHg  AORTA Ao Root diam: 3.00 cm MITRAL VALVE MV Area (PHT): 5.84 cm    SHUNTS MV Decel Time: 130 msec    Systemic Diam: 2.50 cm MV E velocity: 75.80 cm/s MV A velocity: 47.10 cm/s MV E/A ratio:  1.61 Mihai Croitoru MD Electronically signed by Sanda Klein MD Signature Date/Time: 07/04/2020/3:50:18 PM    Final       Subjective: Patient seen and examined bedside, resting comfortably.  Blood pressure better controlled.  No specific complaints or concerns this morning.  Wishes for discharge home today.  Denies headache, no visual changes, no chest pain, palpitations, no shortness of breath, no abdominal pain, no weakness, no fatigue, no paresthesias.  No acute events overnight per nursing staff.  Discharge Exam: Vitals:   07/04/20 2118 07/05/20 0600  BP: (!) 146/78 (!) 111/59  Pulse: 73 76  Resp: 18 18  Temp: 97.6 F (36.4 C) 98.1 F (36.7 C)  SpO2: 100%    Vitals:   07/04/20 0137 07/04/20 1300 07/04/20 2118 07/05/20 0600  BP: (!) 165/97 (!) 141/68 (!) 146/78 (!) 111/59  Pulse: 81 78 73 76  Resp: 20 (!) 22 18 18   Temp:  (!) 97.1 F (36.2 C) 97.6 F (36.4 C) 98.1 F (36.7 C)  TempSrc:  Oral Oral Oral   SpO2: 100%  100%   Weight:    132.6 kg  Height:        General: Pt is alert, awake, not in acute distress, obese Cardiovascular: RRR, S1/S2 +, no rubs, no gallops Respiratory: CTA bilaterally, no wheezing, no rhonchi, oxygenating well on room air Abdominal: Soft, NT, ND, bowel sounds + Extremities: no edema, no cyanosis    The results of significant diagnostics from this hospitalization (including imaging, microbiology, ancillary and laboratory) are listed below for reference.     Microbiology: No results found for this or any previous visit (from the past 240 hour(s)).   Labs: BNP (last 3 results) Recent Labs    07/03/20 1209  BNP 123456*   Basic Metabolic Panel: Recent Labs  Lab 07/03/20 1209 07/04/20 0159 07/05/20 0507  NA 141 140 140  K 3.9 4.0 4.3  CL 105 102 105  CO2 26 23 25   GLUCOSE 164* 185* 115*  BUN 13 15 25*  CREATININE 1.12* 1.13* 1.33*  CALCIUM 9.9 9.3 8.8*  MG  --  2.0  --    Liver Function Tests: No results for input(s): AST, ALT, ALKPHOS, BILITOT, PROT, ALBUMIN in the last 168 hours. No results for input(s): LIPASE, AMYLASE in the last 168 hours. No results for input(s): AMMONIA in the last 168 hours. CBC: Recent Labs  Lab 07/03/20 1209 07/04/20 0159  WBC 29.8* 27.6*  NEUTROABS  --  22.4*  HGB 13.9 12.7  HCT 42.4 39.7  MCV 80.3 81.9  PLT 476* 448*   Cardiac Enzymes: No results for input(s): CKTOTAL, CKMB, CKMBINDEX, TROPONINI in the last 168 hours. BNP: Invalid input(s): POCBNP CBG: No results for input(s): GLUCAP in the last 168 hours. D-Dimer Recent Labs    07/03/20 1209 07/04/20 0159  DDIMER 0.54* 0.38   Hgb A1c No results for input(s): HGBA1C in the last 72 hours. Lipid Profile No results for input(s): CHOL, HDL, LDLCALC, TRIG, CHOLHDL, LDLDIRECT in the last 72 hours. Thyroid function studies No results for input(s): TSH, T4TOTAL, T3FREE, THYROIDAB in the last 72 hours.  Invalid input(s): FREET3 Anemia work  up Recent Labs    07/04/20 0159  FERRITIN 10*   Urinalysis    Component Value Date/Time   COLORURINE YELLOW 12/11/2016 [place] 12/11/2016 2355   LABSPEC 1.013 12/11/2016 2355   PHURINE 5.0 12/11/2016 2355   GLUCOSEU NEGATIVE 12/11/2016 2355   HGBUR NEGATIVE 12/11/2016 2355   BILIRUBINUR NEGATIVE 12/11/2016 2355   KETONESUR NEGATIVE 12/11/2016 2355   PROTEINUR NEGATIVE 12/11/2016 2355   UROBILINOGEN 0.2 12/03/2014 1319   NITRITE NEGATIVE 12/11/2016 2355   LEUKOCYTESUR NEGATIVE 12/11/2016 2355   Sepsis Labs Invalid input(s): PROCALCITONIN,  WBC,  LACTICIDVEN Microbiology No results found for this or any previous visit (from the past 240 hour(s)).   Time coordinating discharge: Over 30 minutes  SIGNED:   Donnamarie Poag [place] DO  [facility] 07/05/2020, 10:06 AM

## 2020-07-07 ENCOUNTER — Emergency Department (HOSPITAL_BASED_OUTPATIENT_CLINIC_OR_DEPARTMENT_OTHER): Payer: Managed Care, Other (non HMO)

## 2020-07-07 ENCOUNTER — Encounter (HOSPITAL_BASED_OUTPATIENT_CLINIC_OR_DEPARTMENT_OTHER): Payer: Self-pay | Admitting: *Deleted

## 2020-07-07 ENCOUNTER — Other Ambulatory Visit: Payer: Self-pay

## 2020-07-07 ENCOUNTER — Emergency Department (HOSPITAL_BASED_OUTPATIENT_CLINIC_OR_DEPARTMENT_OTHER)
Admission: EM | Admit: 2020-07-07 | Discharge: 2020-07-07 | Disposition: A | Discharge status: Home / Self Care | Payer: Managed Care, Other (non HMO) | Attending: Emergency Medicine | Admitting: Emergency Medicine

## 2020-07-07 DIAGNOSIS — Z8616 Personal history of COVID-19: Secondary | ICD-10-CM | POA: Diagnosis not present

## 2020-07-07 DIAGNOSIS — R110 Nausea: Secondary | ICD-10-CM | POA: Insufficient documentation

## 2020-07-07 DIAGNOSIS — R6883 Chills (without fever): Secondary | ICD-10-CM | POA: Diagnosis not present

## 2020-07-07 DIAGNOSIS — D219 Benign neoplasm of connective and other soft tissue, unspecified: Secondary | ICD-10-CM

## 2020-07-07 DIAGNOSIS — Z79899 Other long term (current) drug therapy: Secondary | ICD-10-CM | POA: Diagnosis not present

## 2020-07-07 DIAGNOSIS — R1031 Right lower quadrant pain: Secondary | ICD-10-CM | POA: Insufficient documentation

## 2020-07-07 DIAGNOSIS — R109 Unspecified abdominal pain: Secondary | ICD-10-CM

## 2020-07-07 DIAGNOSIS — I5031 Acute diastolic (congestive) heart failure: Secondary | ICD-10-CM | POA: Insufficient documentation

## 2020-07-07 DIAGNOSIS — R000 Tachycardia, unspecified: Secondary | ICD-10-CM | POA: Insufficient documentation

## 2020-07-07 DIAGNOSIS — I110 Hypertensive heart disease with heart failure: Secondary | ICD-10-CM | POA: Insufficient documentation

## 2020-07-07 LAB — URINALYSIS, ROUTINE W REFLEX MICROSCOPIC
Bilirubin Urine: NEGATIVE
Glucose, UA: NEGATIVE mg/dL
Hgb urine dipstick: NEGATIVE
Ketones, ur: NEGATIVE mg/dL
Leukocytes,Ua: NEGATIVE
Nitrite: NEGATIVE
Protein, ur: NEGATIVE mg/dL
Specific Gravity, Urine: 1.02 (ref 1.005–1.030)
pH: 6.5 (ref 5.0–8.0)

## 2020-07-07 LAB — LACTIC ACID, PLASMA: Lactic Acid, Venous: 0.8 mmol/L (ref 0.5–1.9)

## 2020-07-07 LAB — CBC
HCT: 41.6 % (ref 36.0–46.0)
Hemoglobin: 13.8 g/dL (ref 12.0–15.0)
MCH: 26.7 pg (ref 26.0–34.0)
MCHC: 33.2 g/dL (ref 30.0–36.0)
MCV: 80.5 fL (ref 80.0–100.0)
Platelets: 464 10*3/uL — ABNORMAL HIGH (ref 150–400)
RBC: 5.17 MIL/uL — ABNORMAL HIGH (ref 3.87–5.11)
RDW: 14.8 % (ref 11.5–15.5)
WBC: 18.3 10*3/uL — ABNORMAL HIGH (ref 4.0–10.5)
nRBC: 0 % (ref 0.0–0.2)

## 2020-07-07 LAB — COMPREHENSIVE METABOLIC PANEL
ALT: 17 U/L (ref 0–44)
AST: 21 U/L (ref 15–41)
Albumin: 4 g/dL (ref 3.5–5.0)
Alkaline Phosphatase: 69 U/L (ref 38–126)
Anion gap: 11 (ref 5–15)
BUN: 10 mg/dL (ref 6–20)
CO2: 24 mmol/L (ref 22–32)
Calcium: 8.5 mg/dL — ABNORMAL LOW (ref 8.9–10.3)
Chloride: 99 mmol/L (ref 98–111)
Creatinine, Ser: 1.03 mg/dL — ABNORMAL HIGH (ref 0.44–1.00)
GFR, Estimated: >60 mL/min (ref >60)
Glucose, Bld: 92 mg/dL (ref 70–99)
Potassium: 4.1 mmol/L (ref 3.5–5.1)
Sodium: 134 mmol/L — ABNORMAL LOW (ref 135–145)
Total Bilirubin: 0.5 mg/dL (ref 0.3–1.2)
Total Protein: 8.2 g/dL — ABNORMAL HIGH (ref 6.5–8.1)

## 2020-07-07 LAB — LIPASE, BLOOD: Lipase: 25 U/L (ref 11–51)

## 2020-07-07 LAB — PREGNANCY, URINE: Preg Test, Ur: NEGATIVE

## 2020-07-07 MED ORDER — ONDANSETRON HCL 4 MG/2ML IJ SOLN
4.0000 mg | Freq: Once | INTRAMUSCULAR | Status: AC
  Administered 2020-07-07: 4 mg via INTRAVENOUS
  Filled 2020-07-07: qty 2

## 2020-07-07 MED ORDER — SODIUM CHLORIDE 0.9 % IV BOLUS
500.0000 mL | Freq: Once | INTRAVENOUS | Status: AC
  Administered 2020-07-07: 500 mL via INTRAVENOUS

## 2020-07-07 MED ORDER — OXYCODONE-ACETAMINOPHEN 5-325 MG PO TABS
1.0000 | ORAL_TABLET | Freq: Once | ORAL | Status: AC
Start: 2020-07-07 — End: 2020-07-07
  Administered 2020-07-07: 1 via ORAL
  Filled 2020-07-07: qty 1

## 2020-07-07 MED ORDER — HYDROMORPHONE HCL 1 MG/ML IJ SOLN
1.0000 mg | Freq: Once | INTRAMUSCULAR | Status: AC
Start: 2020-07-07 — End: 2020-07-07
  Administered 2020-07-07: 1 mg via INTRAVENOUS
  Filled 2020-07-07: qty 1

## 2020-07-07 MED ORDER — IOHEXOL 300 MG/ML  SOLN
100.0000 mL | Freq: Once | INTRAMUSCULAR | Status: AC | PRN
  Administered 2020-07-07: 100 mL via INTRAVENOUS

## 2020-07-07 MED ORDER — MORPHINE SULFATE (PF) 4 MG/ML IV SOLN
4.0000 mg | Freq: Once | INTRAVENOUS | Status: AC
Start: 2020-07-07 — End: 2020-07-07
  Administered 2020-07-07: 4 mg via INTRAVENOUS
  Filled 2020-07-07: qty 1

## 2020-07-07 NOTE — ED Notes (Signed)
Patient transported to Ultrasound 

## 2020-07-07 NOTE — ED Provider Notes (Addendum)
8:56 PM signout from [employer] at shift change.   Patient with right-sided and right lower quadrant abdominal pain, currently undergoing evaluation.  She has a elevated white blood cell count, however improved from recent admission for hypertensive urgency.  CT scan was unrevealing.  Pending pelvic ultrasound.  Additional pain medication ordered.  Pain is better controlled at the current time.  Patient without vomiting or fever.  Give a dose of oral pain medication prior to discharge.  Discussed return precautions with patient and need for PCP follow-up early next week if symptoms do not improve.  She states that she was at her doctor's office today for hospitalization follow-up, but was referred to the emergency department.  The patient was urged to return to the Emergency Department immediately with worsening of current symptoms, worsening abdominal pain, persistent vomiting, blood noted in stools, fever, or any other concerns. The patient verbalized understanding.   8:58 PM Exam:  Gen NAD; Heart HR 100, nml S1,S2, no m/r/g; Lungs CTAB; Abd soft, mild RLQ tenderness, no rebound or guarding.  BP 140/77   Pulse (!) 103   Temp 98.9 F (37.2 C) (Oral)   Resp 20   Ht 5\' 3"  (1.6 m)   Wt 127.5 kg   LMP 06/16/2020 Comment: Neg preg test   SpO2 98%   BMI 49.78 kg/m       Carlisle Cater, Hershal Coria 07/07/20 2058    Truddie Hidden, MD 07/07/20 2259

## 2020-07-07 NOTE — ED Triage Notes (Signed)
Abdominal pain since yesterday. Denies constipation. Pain in her right lower quadrant down her left leg and into her flank.

## 2020-07-07 NOTE — Discharge Instructions (Signed)
Please read and follow all provided instructions.  Your diagnoses today include:  1. Right-sided abdominal pain of unknown cause   2. RLQ abdominal pain   3. Leiomyoma     Tests performed today include:  Blood cell counts and platelets - high white blood cells trending down from recent admission  Kidney and liver function tests  Pancreas function test (called lipase)  Urine test to look for infection  A blood or urine test for pregnancy (women only)  CT scan of the abdomen -shows fibroids, no other serious problems  Ultrasound of your pelvis -shows fibroids, no other serious problems  Vital signs. See below for your results today.   Medications prescribed:   None  Take any prescribed medications only as directed.  Home care instructions:   Follow any educational materials contained in this packet.  Follow-up instructions: Please follow-up with your primary care provider in the next 3 days for further evaluation of your symptoms.    Return instructions:  SEEK IMMEDIATE MEDICAL ATTENTION IF:  The pain does not go away or becomes severe   A temperature above 101F develops   Repeated vomiting occurs (multiple episodes)   The pain becomes localized to portions of the abdomen. The right side could possibly be appendicitis. In an adult, the left lower portion of the abdomen could be colitis or diverticulitis.   Blood is being passed in stools or vomit (bright red or black tarry stools)   You develop chest pain, difficulty breathing, dizziness or fainting, or become confused, poorly responsive, or inconsolable (young children)  If you have any other emergent concerns regarding your health  Additional Information: Abdominal (belly) pain can be caused by many things. Your caregiver performed an examination and possibly ordered blood/urine tests and imaging (CT scan, x-rays, ultrasound). Many cases can be observed and treated at home after initial evaluation in the  emergency department. Even though you are being discharged home, abdominal pain can be unpredictable. Therefore, you need a repeated exam if your pain does not resolve, returns, or worsens. Most patients with abdominal pain don't have to be admitted to the hospital or have surgery, but serious problems like appendicitis and gallbladder attacks can start out as nonspecific pain. Many abdominal conditions cannot be diagnosed in one visit, so follow-up evaluations are very important.  Your vital signs today were: BP 140/77   Pulse (!) 103   Temp 98.9 F (37.2 C) (Oral)   Resp 20   Ht 5\' 3"  (1.6 m)   Wt 127.5 kg   LMP 06/16/2020 Comment: Neg preg test   SpO2 98%   BMI 49.78 kg/m  If your blood pressure (bp) was elevated above 135/85 this visit, please have this repeated by your doctor within one month. --------------

## 2020-07-07 NOTE — ED Provider Notes (Signed)
[facility] EMERGENCY DEPARTMENT Provider Note   CSN: [id number] Arrival date & time: 07/07/20  1748     History Chief Complaint  Patient presents with  . Abdominal Pain    Annette Deleon is a 45 y.o. female with PMHx HTN who presents to the ED today with complaint of sudden onset, constant, worsening, sharp, RLQ abdominal pain that began yesterday. Pt also complains of nausea and chills. She reports she took a hydrocodone last night that she is prescribed from recent L knee surgery with mild relief of her pain however it became severe today prompting her to come to the ED. PSHx includes cesarean section, cholecystectomy, myomectomy. She denies any fevers, vomiting, diarrhea, constipation, urinary symptoms. Last normal bowel movement this morning without BRB or melena.   Pt does mention she was recently discharged from the hospital 2 days ago. It appears that she recently had COVID 19 on 1/20. She was treated with cefdinir and prednisone. She then presented to [facility] on 1/31 for chest pain and SOB and found to be significantly hypertensive with a BP 201/113. CTA was negative for PE however blood pressure still not managed despite multiple antihypertensives and pt was admitted for hypertensive urgency. While in the ED pt was noted to have a leukocytosis of 29,000 however suspect to be from pt's recent prednisone use.   Hospital course:  Hypertensive urgency Patient presenting with elevated blood pressure of 222/119.  Her home medications the day of ED presentation, and relates this may be the reason why her blood pressure is so elevated.  Patient was restarted on her home medications slowly to include carvedilol 25 mg twice daily, diltiazem 120 mg p.o. daily, losartan 100 mg p.o. daily with improvement of her blood pressure.  Will also resume her home HCTZ and furosemide on discharge.  Outpatient follow-up with PCP.  Patient instructed to maintain blood pressure log to bring to next PCP  visit.  The history is provided by the patient and medical records.       Past Medical History:  Diagnosis Date  . Hypertension   . Migraines   . Obesity   . Pseudotumor cerebri   . Sleep apnea     Patient Active Problem List   Diagnosis Date Noted  . COVID-19 virus infection 07/03/2020  . Acute diastolic CHF (congestive heart failure) ([place]) 07/03/2020  . Chest pain, rule out acute myocardial infarction 06/13/2018  . Aortic atherosclerosis ([place])   . Pure hypercholesterolemia   . Near syncope 09/08/2014  . Complicated migraine [id number]  . HTN (hypertension) 11/20/2011  . Pseudotumor cerebri 11/20/2011  . Obesity 11/20/2011    Past Surgical History:  Procedure Laterality Date  . ARTHROSCOPIC REPAIR ACL    . CESAREAN SECTION    . CHOLECYSTECTOMY    . KNEE SURGERY     right  . MYOMECTOMY       OB History   No obstetric history on file.     Family History  Problem Relation Age of Onset  . Heart attack Father 71    Social History   Tobacco Use  . Smoking status: Never Smoker  . Smokeless tobacco: Never Used  Vaping Use  . Vaping Use: Never used  Substance Use Topics  . Alcohol use: Yes    Comment: occasionally  . Drug use: No    Home Medications Prior to Admission medications   Medication Sig Start Date End Date Taking? Authorizing Provider  acetaminophen (TYLENOL) 500 MG tablet Take 500-1,000  mg by mouth every 6 (six) hours as needed for mild pain.    [provider]  albuterol (PROVENTIL HFA;VENTOLIN HFA) 108 (90 Base) MCG/ACT inhaler Inhale 1 puff into the lungs every 6 (six) hours as needed for wheezing or shortness of breath.  11/19/16   [provider]  atorvastatin (LIPITOR) 40 MG tablet Take 1 tablet (40 mg total) by mouth daily at 6 PM. 06/13/18   Bonnielee Haff, MD  benzonatate (TESSALON) 200 MG capsule Take 200 mg by mouth 3 (three) times daily as needed for cough. 06/01/20   [provider]  Budesonide 90 MCG/ACT  inhaler Inhale 1 puff into the lungs every 6 (six) hours as needed (wheezing or shortness of breath). 12/08/19   [provider]  carvedilol (COREG) 25 MG tablet Take 25 mg by mouth 2 (two) times daily. 06/24/19   [provider]  cloNIDine (CATAPRES) 0.2 MG tablet Take 0.1 mg by mouth daily as needed (if systolic Blood pressure is > 160). 06/30/19   [provider]  diclofenac sodium (VOLTAREN) 1 % GEL Apply 2 g topically daily as needed (pain).    [provider]  diltiazem (CARDIZEM CD) 120 MG 24 hr capsule Take 120 mg by mouth daily. 05/26/20   [provider]  DM-Doxylamine-Acetaminophen (NYQUIL HBP COLD & FLU) 15-6.25-325 MG/15ML LIQD Take 10 mLs by mouth every 8 (eight) hours as needed (rest or cold symptoms).    [provider]  DM-Phenylephrine-Acetaminophen (VICKS DAYQUIL COLD & FLU) 10-5-325 MG/15ML LIQD Take 10 mLs by mouth every 6 (six) hours as needed (cold symptoms).    [provider]  fluticasone (FLONASE) 50 MCG/ACT nasal spray Place 2 sprays into both nostrils daily as needed for allergies. 06/01/20   [provider]  furosemide (LASIX) 20 MG tablet Take 20 mg by mouth daily. 09/21/19   [provider]  gabapentin (NEURONTIN) 300 MG capsule Take 300 mg by mouth at bedtime as needed (pain). 01/17/20   [provider]  guaiFENesin (MUCINEX) 600 MG 12 hr tablet Take 600 mg by mouth 2 (two) times daily as needed for cough or to loosen phlegm.    [provider]  hydrochlorothiazide (HYDRODIURIL) 25 MG tablet Take 25 mg by mouth daily.    [provider]  Liraglutide -Weight Management 18 MG/3ML SOPN Inject 3 mLs into the skin daily.    [provider]  loratadine (CLARITIN) 10 MG tablet Take 10 mg by mouth daily. 01/18/20   [provider]  losartan (COZAAR) 100 MG tablet Take 100 mg by mouth daily.    [provider]    Allergies    Imitrex [sumatriptan  base], Prednisone, Wellbutrin [bupropion], Yellow jacket venom [bee venom], Ibuprofen, and Penicillins  Review of Systems   Review of Systems  Constitutional: Positive for chills. Negative for fever.  Gastrointestinal: Positive for abdominal pain and nausea. Negative for constipation, diarrhea and vomiting.  Genitourinary: Negative for dysuria and frequency.  All other systems reviewed and are negative.   Physical Exam Updated Vital Signs BP (!) 192/115   Pulse (!) 114   Temp 98.9 F (37.2 C) (Oral)   Resp (!) 24   Ht 5\' 3"  (1.6 m)   Wt 127.5 kg   LMP 06/16/2020   SpO2 100%   BMI 49.78 kg/m   Physical Exam Vitals and nursing note reviewed.  Constitutional:      Appearance: She is obese. She is not diaphoretic.     Comments:  Uncomfortable appearing  HENT:     Head: Normocephalic and atraumatic.  Eyes:     Conjunctiva/sclera: Conjunctivae normal.  Cardiovascular:     Rate and Rhythm: Regular rhythm. Tachycardia present.  Pulmonary:     Effort: Pulmonary effort is normal.     Breath sounds: Normal breath sounds. No wheezing, rhonchi or rales.  Abdominal:     Palpations: Abdomen is soft.     Tenderness: There is abdominal tenderness in the right lower quadrant. There is guarding (voluntary). There is no rebound.  Musculoskeletal:     Cervical back: Neck supple.  Skin:    General: Skin is warm and dry.  Neurological:     Mental Status: She is alert.     ED Results / Procedures / Treatments   Labs (all labs ordered are listed, but only abnormal results are displayed) Labs Reviewed  COMPREHENSIVE METABOLIC PANEL - Abnormal; Notable for the following components:      Result Value   Sodium 134 (*)    Creatinine, Ser 1.03 (*)    Calcium 8.5 (*)    Total Protein 8.2 (*)    All other components within normal limits  CBC - Abnormal; Notable for the following components:   WBC 18.3 (*)    RBC 5.17 (*)    Platelets 464 (*)    All other components within normal limits   LIPASE, BLOOD  URINALYSIS, ROUTINE W REFLEX MICROSCOPIC  PREGNANCY, URINE  LACTIC ACID, PLASMA  LACTIC ACID, PLASMA    EKG EKG Interpretation  Date/Time:  Friday July 07 2020 19:14:37 EST Ventricular Rate:  103 PR Interval:    QRS Duration: 93 QT Interval:  339 QTC Calculation: 444 R Axis:   69 Text Interpretation: Sinus tachycardia Right atrial enlargement Borderline repolarization abnormality Since last tracing Rate faster lateral ST depression is resolved Confirmed by Calvert Cantor [phone]) on 07/07/2020 7:47:53 PM   Radiology CT Abdomen Pelvis W Contrast  Result Date: 07/07/2020 CLINICAL DATA:  Right lower quadrant pain EXAM: CT ABDOMEN AND PELVIS WITH CONTRAST TECHNIQUE: Multidetector CT imaging of the abdomen and pelvis was performed using the standard protocol following bolus administration of intravenous contrast. CONTRAST:  15mL OMNIPAQUE IOHEXOL 300 MG/ML  SOLN COMPARISON:  12/11/2016 FINDINGS: Lower chest: Lung bases are clear. No effusions. Heart is normal size. Hepatobiliary: No focal liver abnormality is seen. Status post cholecystectomy. No biliary dilatation. Pancreas: No focal abnormality or ductal dilatation. Spleen: No focal abnormality.  Normal size. Adrenals/Urinary Tract: 2.5 cm cyst off the lower pole of the left kidney. No stones or hydronephrosis. Urinary bladder and adrenal glands unremarkable. Stomach/Bowel: Normal appendix. Few scattered sigmoid diverticula. No active diverticulitis. Stomach and small bowel decompressed, unremarkable. Vascular/Lymphatic: Aortic atherosclerosis. No evidence of aneurysm or adenopathy. Reproductive: Numerous fibroids throughout the enlarged uterus measuring up to 6.4 cm. No adnexal mass. Other: No free fluid or free air. Musculoskeletal: No acute bony abnormality. IMPRESSION: Normal appendix. Enlarged fibroid uterus. Aortic atherosclerosis. No acute findings. Electronically Signed   By: Rolm Baptise M.D.   On: 07/07/2020 19:42     Procedures Procedures   Medications Ordered in ED Medications  HYDROmorphone (DILAUDID) injection 1 mg (has no administration in time range)  sodium chloride 0.9 % bolus 500 mL (500 mLs Intravenous New Bag/Given 07/07/20 1913)  morphine 4 MG/ML injection 4 mg (4 mg Intravenous Given 07/07/20 1901)  ondansetron (ZOFRAN) injection 4 mg (4 mg Intravenous Given 07/07/20 1900)  iohexol (OMNIPAQUE) 300 MG/ML solution 100 mL (100 mLs Intravenous  Contrast Given 07/07/20 1928)    ED Course  I have reviewed the triage vital signs and the nursing notes.  Pertinent labs & imaging results that were available during my care of the patient were reviewed by me and considered in my medical decision making (see chart for details).  Clinical Course as of 07/07/20 1956  Fri Jul 07, 2020  1935 Lactic Acid, Venous: 0.8 [MV]    Clinical Course User Index [MV] Eustaquio Maize, [place]   MDM Rules/Calculators/A&P                          45 year old female who presents to the ED today with complaint of sudden onset right lower quadrant abdominal pain with associated nausea that began yesterday.  Incidentally she was just discharged from the hospital on 2/2 secondary to hypertensive urgency.  On arrival to the ED today patient is afebrile.  She is tachycardic in the 110s and mildly tachypneic respirations of 24.  Satting 100%.  Blood pressure also significantly elevated at 192/115.  Patient reports she has been compliant with her medications since discharge.  Does appear her blood pressure was well managed while in the hospital, question if her blood pressure is elevated secondary to her pain.  She does appear uncomfortable today on exam.  She has obvious right lower quadrant abdominal tenderness palpation with voluntary guarding.  Patient does still have an appendix and there is concern for possible acute appendicitis at this time.  Lab work is already been obtained prior to patient being seen.  CBC with a leukocytosis  of 18,000 however does appear improved from previous during her hospital course, suspected that she was having leukocytosis secondary to prednisone use.  CMP with a sodium of 134.  Creatinine 1.03 which is improved from baseline.  LFTs unremarkable.  Past history of cholecystectomy.  Lipase within normal limits at 25.  Urinalysis without signs of infection and no hemoglobin appreciated.  Patient will need CT scan to rule out appendicitis at this time.  Will provide a small amount of fluids, patient does have a history of CHF with preserved EF of 70 to 75%.  500 cc fluids ordered at this time.  Morphine and antiemetics provided as well.  We will plan to repeat blood pressure to see if she is hypertensive secondary to pain at this time.  Will add on a lactic acid today give elevated white blood cell count. Discussed case with attending physician Dr. Karle Starch who is in agreement with plan at this time.   Repeat blood pressure improving at 166/90 Lactic acid within normal limits at 0.8  CT scan IMPRESSION:  Normal appendix.    Enlarged fibroid uterus.    Aortic atherosclerosis.    No acute findings.   On reevaluation pt still writhing around in bed. No obvious improvement in her symptoms. She continues to have pain in her RLQ despite normal CT scan for appendicitis. Will plan for ultrasound at this time to assess for blood flow to her ovary. Pt denies any vaginal discharge and is not concerned regarding STDs at this time. Will hold off on pelvic exam currently. Additional pain medicine provided.   At shift change case signed out to [facility], PA-C, pending ultrasound. If negative pt will need outpatient follow up with PCP and OBGYN for her large fibroid.    Final Clinical Impression(s) / ED Diagnoses Final diagnoses:  RLQ abdominal pain    Rx / DC Orders ED  Discharge Orders    None       Eustaquio Maize, [place] 07/07/20 1956    Truddie Hidden, MD 07/07/20 2259

## 2020-07-24 ENCOUNTER — Other Ambulatory Visit: Payer: Self-pay | Admitting: Surgical

## 2020-07-24 DIAGNOSIS — T23262D Burn of second degree of back of left hand, subsequent encounter: Secondary | ICD-10-CM

## 2020-10-02 ENCOUNTER — Other Ambulatory Visit: Payer: Self-pay

## 2020-10-02 ENCOUNTER — Encounter: Payer: Self-pay | Admitting: Pulmonary Disease

## 2020-10-02 ENCOUNTER — Ambulatory Visit (INDEPENDENT_AMBULATORY_CARE_PROVIDER_SITE_OTHER): Payer: Self-pay | Admitting: Pulmonary Disease

## 2020-10-02 VITALS — BP 144/84 | HR 111 | Temp 98.2°F | Ht 63.0 in | Wt 273.2 lb

## 2020-10-02 DIAGNOSIS — R0609 Other forms of dyspnea: Secondary | ICD-10-CM

## 2020-10-02 DIAGNOSIS — R0600 Dyspnea, unspecified: Secondary | ICD-10-CM

## 2020-10-02 DIAGNOSIS — J4540 Moderate persistent asthma, uncomplicated: Secondary | ICD-10-CM

## 2020-10-02 MED ORDER — BREO ELLIPTA 200-25 MCG/INH IN AEPB: 1.0000 | INHALATION_SPRAY | Freq: Every day | RESPIRATORY_TRACT | 11 refills | Status: DC

## 2020-10-02 NOTE — Progress Notes (Signed)
@Patient  ID: Annette Deleon, female    DOB: 05-Jan-1976, 45 y.o.   MRN: [id number]  Chief Complaint  Patient presents with  . Consult    Referring provider: Jolinda Croak, MD  HPI:   45 year old whom we are seeing in consultation for evaluation of persistent cough.  PCP note x3 reviewed.  Discharge summary 07/05/2020 for hospitalization due to hypertensive emergency reviewed.  Patient with onset of chronic cough since January 2021.  This was in the setting of COVID-19 virus diagnosis.  Since then has had persistent cough.  Described largely as coughing fits.  Usually nonproductive, dry.  Worse with deep breaths.  No clear environmental factors that make things better or worse.  No timing during the day when things are better or worse.  Not much change or worse or better with change in season, pollen.  She does note chronic allergies as well as getting hives intermittently.  Tessalon Perles as well as Robitussin helps a little but not immensely.  She uses albuterol as needed both nebulized and inhaler with mild improvement.  She is now higher doses of prednisone over the last couple months without any real improvement in symptoms.  Chronically takes prednisone 5 mg daily for knee pain per her orthopedist.  Severity of cough described as severe.  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  She desperately desires to return to work and wishes to return to work.  Reviewed serial chest images.  Most recent chest x-ray 07/03/2020 reviewed interpreted as clear lungs without infiltrate, effusion, pneumothorax.  CT angio PE protocol chest 07/03/2020 reviewed and interpreted as clear lungs and no evidence of pulmonary embolus.  TTE 07/04/2020 with grade 2 diastolic dysfunction, elevated left atrial pressure, moderately dilated LA, moderate MVR, normal RV size and systolic function as well as  normal RA size.  PMH: Hypertension, seasonal allergies, asthma, diastolic CHF Surgical history: C-section, cholecystectomy Family history: CAD in father, no significant respiratory illnesses in first-degree relatives on review Social history: Never smoker, lives in [place] / Pulmonary Flowsheets:   ACT:  No flowsheet data found.  MMRC: No flowsheet data found.  Epworth:  No flowsheet data found.  Tests:   FENO:  No results found for: NITRICOXIDE  PFT: No flowsheet data found.  WALK:  No flowsheet data found.  Imaging: No results found.  Lab Results: Personally reviewed, recently no elevated eosinophils although were 300 in 2016 CBC    Component Value Date/Time   WBC 18.3 (H) 07/07/2020 1820   RBC 5.17 (H) 07/07/2020 1820   HGB 13.8 07/07/2020 1820   HCT 41.6 07/07/2020 1820   PLT 464 (H) 07/07/2020 1820   MCV 80.5 07/07/2020 1820   MCH 26.7 07/07/2020 1820   MCHC 33.2 07/07/2020 1820   RDW 14.8 07/07/2020 1820   LYMPHSABS 3.6 07/04/2020 0159   MONOABS 1.2 (H) 07/04/2020 0159   EOSABS 0.0 07/04/2020 0159   BASOSABS 0.1 07/04/2020 0159    BMET    Component Value Date/Time   NA 134 (L) 07/07/2020 1820   K 4.1 07/07/2020 1820   CL 99 07/07/2020 1820   CO2 24 07/07/2020 1820   GLUCOSE 92 07/07/2020 1820   BUN 10 07/07/2020 1820   CREATININE 1.03 (H) 07/07/2020 1820   CALCIUM 8.5 (L) 07/07/2020 1820   GFRNONAA >60 07/07/2020 1820   GFRAA >  60 06/12/2018 1941    BNP    Component Value Date/Time   BNP 455.1 (H) 07/03/2020 1209    ProBNP No results found for: PROBNP  Specialty Problems   None     Allergies  Allergen Reactions  . Imitrex [Sumatriptan Base] Anaphylaxis  . Prednisone Palpitations  . Wellbutrin [Bupropion] Anaphylaxis  . [place] Venom [Bee Venom] Hives  . Ibuprofen Nausea And Vomiting  . Penicillins Hives    Has patient had a PCN reaction causing immediate rash, facial/tongue/throat swelling, SOB or  lightheadedness with hypotension: yes Has patient had a PCN reaction causing severe rash involving mucus membranes or skin necrosis: yes Has patient had a PCN reaction that required hospitalization: no Has patient had a PCN reaction occurring within the last 10 years: yes If all of the above answers are "NO", then may proceed with Cephalosporin use.    Immunization History  Administered Date(s) Administered  . PFIZER(Purple Top)SARS-COV-2 Vaccination 09/23/2019, 10/14/2019  . Tdap 05/31/2014, 07/04/2019    Past Medical History:  Diagnosis Date  . Hypertension   . Migraines   . Obesity   . Pseudotumor cerebri   . Sleep apnea     Tobacco History: Social History   Tobacco Use  Smoking Status Never Smoker  Smokeless Tobacco Never Used   Counseling given: Not Answered   Continue to not smoke  Outpatient Encounter Medications as of 10/02/2020  Medication Sig  . fluticasone furoate-vilanterol (BREO ELLIPTA) 200-25 MCG/INH AEPB Inhale 1 puff into the lungs daily.  . [DISCONTINUED] predniSONE (DELTASONE) 20 MG tablet Take by mouth.  Marland Kitchen acetaminophen (TYLENOL) 500 MG tablet Take 500-1,000 mg by mouth every 6 (six) hours as needed for mild pain.  Marland Kitchen albuterol (PROVENTIL HFA;VENTOLIN HFA) 108 (90 Base) MCG/ACT inhaler Inhale 1 puff into the lungs every 6 (six) hours as needed for wheezing or shortness of breath.   Marland Kitchen atorvastatin (LIPITOR) 40 MG tablet Take 1 tablet (40 mg total) by mouth daily at 6 PM.  . benzonatate (TESSALON) 200 MG capsule Take 200 mg by mouth 3 (three) times daily as needed for cough.  . Budesonide 90 MCG/ACT inhaler Inhale 1 puff into the lungs every 6 (six) hours as needed (wheezing or shortness of breath).  . carvedilol (COREG) 25 MG tablet Take 25 mg by mouth 2 (two) times daily.  . cloNIDine (CATAPRES) 0.2 MG tablet Take 0.1 mg by mouth daily as needed (if systolic Blood pressure is > 160).  Marland Kitchen diclofenac sodium (VOLTAREN) 1 % GEL Apply 2 g topically daily as  needed (pain).  Marland Kitchen diltiazem (CARDIZEM CD) 120 MG 24 hr capsule Take 120 mg by mouth daily.  Marland Kitchen DM-Doxylamine-Acetaminophen (NYQUIL HBP COLD & FLU) 15-6.25-325 MG/15ML LIQD Take 10 mLs by mouth every 8 (eight) hours as needed (rest or cold symptoms).  Marland Kitchen DM-Phenylephrine-Acetaminophen (VICKS DAYQUIL COLD & FLU) 10-5-325 MG/15ML LIQD Take 10 mLs by mouth every 6 (six) hours as needed (cold symptoms).  . fluticasone (FLONASE) 50 MCG/ACT nasal spray Place 2 sprays into both nostrils daily as needed for allergies.  . furosemide (LASIX) 20 MG tablet Take 20 mg by mouth daily.  Marland Kitchen gabapentin (NEURONTIN) 300 MG capsule Take 300 mg by mouth at bedtime as needed (pain).  Marland Kitchen guaiFENesin (MUCINEX) 600 MG 12 hr tablet Take 600 mg by mouth 2 (two) times daily as needed for cough or to loosen phlegm.  . hydrochlorothiazide (HYDRODIURIL) 25 MG tablet Take 25 mg by mouth daily. (Patient not taking: Reported on 10/02/2020)  .  Liraglutide -Weight Management 18 MG/3ML SOPN Inject 3 mLs into the skin daily.  Marland Kitchen loratadine (CLARITIN) 10 MG tablet Take 10 mg by mouth daily.  Marland Kitchen losartan (COZAAR) 100 MG tablet Take 100 mg by mouth daily.   No facility-administered encounter medications on file as of 10/02/2020.     Review of Systems  Review of Systems  No chest pain with exertion.  No orthopnea or PND.  Comprehensive review of systems otherwise negative. Physical Exam  BP (!) 144/84 (BP Location: Left Arm, Cuff Size: Normal)   Pulse (!) 111   Temp 98.2 F (36.8 C) (Oral)   Ht 5\' 3"  (1.6 m)   Wt 273 lb 3.2 oz (123.9 kg)   SpO2 98%   BMI 48.40 kg/m   Wt Readings from Last 5 Encounters:  10/02/20 273 lb 3.2 oz (123.9 kg)  07/07/20 281 lb (127.5 kg)  07/05/20 292 lb 5.3 oz (132.6 kg)  06/11/20 280 lb 6.8 oz (127.2 kg)  09/28/19 265 lb 6.4 oz (120.4 kg)    BMI Readings from Last 5 Encounters:  10/02/20 48.40 kg/m  07/07/20 49.78 kg/m  07/05/20 51.78 kg/m  06/11/20 49.68 kg/m  09/28/19 47.01 kg/m      Physical Exam General: Well-appearing, no acute distress Eyes: EOMI, no icterus Neck: Supple, habitus precludes accurate JVP evaluation Cardiovascular: Tachycardic, regular rhythm no murmur appreciated Pulmonary: Clear to auscultation bilaterally, no wheezes Abdomen: Nondistended, bowel sounds present MSK: No synovitis, joint effusion Neuro: Normal gait, no weakness Psych: Normal mood, full affect   Assessment & Plan:   Dyspnea exertion: Likely multifactorial and concern to be related to poorly controlled asthma worse in the setting of recent COVID-19 infection.  Also suspect related to deconditioning, obesity.  See below for plan for asthma.  PFTs ordered to be obtained in the coming days for further evaluation.  Asthma: Likely poorly controlled with recent COVID-19 infection.  Cough, dyspnea on exertion, chest tightness.  Mild relief with as needed albuterol.  Really no relief with increased dose of prednisone.  Advise return to her previous prednisone dose 5 mg daily for her knee per her orthopedic doctor.  Will escalate inhaler therapy with high-dose Breo.  Continue as needed albuterol inhaler and nebulizer.  PFTs as above.  Eos 300 2016.  Significant seasonal allergies and hives, suspect IgE will be elevated.  Plan to obtain at next visit if symptoms not well controlled with maintenance inhaler.   Return in about 3 months (around 01/02/2021).   Lanier Clam, MD 10/02/2020

## 2020-10-02 NOTE — Patient Instructions (Addendum)
Nice to meet you!  I recommend stopping the higher dose prednisone and returning to your previous dose prednisone 5 mg daily per your orthopedic doctor.  Use Breo 1 puff daily.  This is to treat the shortness of breath and cough.  I think some of this is related to asthma that is been set on fire by her recent COVID infection.  Continue to use the albuterol nebulizer as needed.  We will get PFTs in the coming days.  Please schedule this when you check out.  This will help make sure were not missing anything.  We may need to do lab work in the future to see if you are a candidate for more intensive asthma therapies.  These tests can be inaccurate on higher doses of prednisone which you were recently on.  Return to clinic in 3 months for follow-up with Dr. Silas Flood.

## 2020-10-17 ENCOUNTER — Telehealth: Payer: Self-pay | Admitting: Pulmonary Disease

## 2020-10-17 NOTE — Telephone Encounter (Signed)
I have called and tried to LM on VM but the VM is full.  Will try back later.

## 2020-10-18 MED ORDER — FLUTICASONE-SALMETEROL 232-14 MCG/ACT IN AEPB: 1.0000 | INHALATION_SPRAY | Freq: Every day | RESPIRATORY_TRACT | 11 refills | Status: AC

## 2020-10-18 NOTE — Telephone Encounter (Signed)
Wylene Simmer, since the Breo falls into the same therapeutic boat as the [facility] I'm going to go ahead and just copy/paste what I sent yesterday (albeit with a new member ID for the GoodRx card) for consideration!  Airduo Respiclick (fluticasone-salmeterol 232-14 MCG/ACT Aepb) NDC: [id number]. Using a GoodRx card the pt should hopefully be able to get it for $19.21 at a [facility] using this card info: BIN: [id number] PCN: [id number] Group: [id number] ID#: [id number]  Alternatively, Patient Assistance can be obtained through [place] if Advair or Breo is preferred--or through [place] and [place] for Symbicort.

## 2020-10-18 NOTE — Telephone Encounter (Signed)
Pt was prescribed Breo 200 which she states is too expensive for her as she does not have insurance.  Called and spoke with pt letting her know that I would send this to pharmacy team to see if they can help [place] out by seeing what would be a cheaper alternative for pt that is similar to the Breo 200 since she does not have insurance and she verbalized understanding.  [facility], can you please help [place] out with this?

## 2020-10-18 NOTE — Telephone Encounter (Signed)
Dr. Hunsucker, please advise.  °

## 2020-10-18 NOTE — Telephone Encounter (Signed)
Pt returning phone call. Pt can be reached [phone] or [phone].

## 2020-10-19 NOTE — Telephone Encounter (Signed)
ATC patient-unable to leave vm due to mailbox being full.  Will call back.  

## 2020-10-20 NOTE — Telephone Encounter (Signed)
Spoke with the pt and notified that rx for Airduo sent  She verbalized understanding Nothing further needed

## 2020-11-10 ENCOUNTER — Encounter (HOSPITAL_COMMUNITY): Payer: Self-pay | Admitting: *Deleted

## 2020-11-10 ENCOUNTER — Emergency Department (HOSPITAL_COMMUNITY)
Admission: EM | Admit: 2020-11-10 | Discharge: 2020-11-11 | Discharge status: Against Medical Advice | Payer: Self-pay | Attending: Emergency Medicine | Admitting: Emergency Medicine

## 2020-11-10 ENCOUNTER — Emergency Department (HOSPITAL_COMMUNITY): Payer: Self-pay

## 2020-11-10 ENCOUNTER — Other Ambulatory Visit: Payer: Self-pay

## 2020-11-10 DIAGNOSIS — R110 Nausea: Secondary | ICD-10-CM | POA: Insufficient documentation

## 2020-11-10 DIAGNOSIS — R42 Dizziness and giddiness: Secondary | ICD-10-CM | POA: Insufficient documentation

## 2020-11-10 DIAGNOSIS — Z5321 Procedure and treatment not carried out due to patient leaving prior to being seen by health care provider: Secondary | ICD-10-CM | POA: Insufficient documentation

## 2020-11-10 DIAGNOSIS — R519 Headache, unspecified: Secondary | ICD-10-CM | POA: Insufficient documentation

## 2020-11-10 DIAGNOSIS — R079 Chest pain, unspecified: Secondary | ICD-10-CM | POA: Insufficient documentation

## 2020-11-10 DIAGNOSIS — R200 Anesthesia of skin: Secondary | ICD-10-CM | POA: Insufficient documentation

## 2020-11-10 LAB — COMPREHENSIVE METABOLIC PANEL
ALT: 18 U/L (ref 0–44)
AST: 17 U/L (ref 15–41)
Albumin: 3.7 g/dL (ref 3.5–5.0)
Alkaline Phosphatase: 80 U/L (ref 38–126)
Anion gap: 11 (ref 5–15)
BUN: 15 mg/dL (ref 6–20)
CO2: 22 mmol/L (ref 22–32)
Calcium: 9.3 mg/dL (ref 8.9–10.3)
Chloride: 105 mmol/L (ref 98–111)
Creatinine, Ser: 0.98 mg/dL (ref 0.44–1.00)
GFR, Estimated: >60 mL/min (ref >60)
Glucose, Bld: 110 mg/dL — ABNORMAL HIGH (ref 70–99)
Potassium: 3.6 mmol/L (ref 3.5–5.1)
Sodium: 138 mmol/L (ref 135–145)
Total Bilirubin: 0.5 mg/dL (ref 0.3–1.2)
Total Protein: 7.8 g/dL (ref 6.5–8.1)

## 2020-11-10 LAB — CBC WITH DIFFERENTIAL/PLATELET
Abs Immature Granulocytes: 0.03 10*3/uL (ref 0.00–0.07)
Basophils Absolute: 0.1 10*3/uL (ref 0.0–0.1)
Basophils Relative: 1 %
Eosinophils Absolute: 0.2 10*3/uL (ref 0.0–0.5)
Eosinophils Relative: 2 %
HCT: 39.1 % (ref 36.0–46.0)
Hemoglobin: 12.7 g/dL (ref 12.0–15.0)
Immature Granulocytes: 0 %
Lymphocytes Relative: 16 %
Lymphs Abs: 1.8 10*3/uL (ref 0.7–4.0)
MCH: 25.7 pg — ABNORMAL LOW (ref 26.0–34.0)
MCHC: 32.5 g/dL (ref 30.0–36.0)
MCV: 79.1 fL — ABNORMAL LOW (ref 80.0–100.0)
Monocytes Absolute: 0.7 10*3/uL (ref 0.1–1.0)
Monocytes Relative: 6 %
Neutro Abs: 8.6 10*3/uL — ABNORMAL HIGH (ref 1.7–7.7)
Neutrophils Relative %: 75 %
Platelets: 489 10*3/uL — ABNORMAL HIGH (ref 150–400)
RBC: 4.94 MIL/uL (ref 3.87–5.11)
RDW: 15.3 % (ref 11.5–15.5)
WBC: 11.4 10*3/uL — ABNORMAL HIGH (ref 4.0–10.5)
nRBC: 0 % (ref 0.0–0.2)

## 2020-11-10 LAB — TROPONIN I (HIGH SENSITIVITY)
Troponin I (High Sensitivity): 7 ng/L (ref <18)
Troponin I (High Sensitivity): 6 ng/L (ref <18)

## 2020-11-10 NOTE — ED Triage Notes (Signed)
The pt is c/o chest pain a headache numbness in her rt jaw.  Hyperventilating at present  bp higher  lmp now

## 2020-11-10 NOTE — ED Provider Notes (Signed)
Emergency Medicine Provider Triage Evaluation Note  Annette Deleon , a 45 y.o. female  was evaluated in triage.  Pt complains of chest pain, nausea, dizziness, headache, has some tingling and pain to the right lower lip.  Review of Systems  Positive: Chest pain, nausea, dizziness, headache, tingling to lip Negative: fever  Physical Exam  BP (!) 173/107 (BP Location: Left Arm)   Pulse 77   Temp 97.7 F (36.5 C) (Oral)   Resp (!) 24   Ht 5\' 3"  (1.6 m)   Wt 123.9 kg   LMP 11/10/2020   SpO2 99%   BMI 48.39 kg/m  Gen:   Awake, no distress   Resp:  Normal effort  MSK:   Moves extremities without difficulty  Other:  Hyperventilating, heart with rrr, lungs ctab, leftward nystagmus, clear speech, 5/5 strength to the bue/ble  Medical Decision Making  Medically screening exam initiated at 7:26 PM.  Appropriate orders placed.  Annette Deleon was informed that the remainder of the evaluation will be completed by another provider, this initial triage assessment does not replace that evaluation, and the importance of remaining in the ED until their evaluation is complete.     Annette Deleon 11/10/20 1929    Annette Chapel, MD 11/11/20 1501

## 2020-11-10 NOTE — ED Notes (Signed)
C/oa headache also

## 2020-11-11 NOTE — ED Notes (Signed)
LWBS 

## 2020-11-16 NOTE — Telephone Encounter (Signed)
Error

## 2020-11-20 ENCOUNTER — Emergency Department (HOSPITAL_COMMUNITY)
Admission: EM | Admit: 2020-11-20 | Discharge: 2020-11-20 | Disposition: A | Discharge status: Home / Self Care | Payer: Self-pay | Attending: Emergency Medicine | Admitting: Emergency Medicine

## 2020-11-20 ENCOUNTER — Other Ambulatory Visit: Payer: Self-pay

## 2020-11-20 ENCOUNTER — Emergency Department (HOSPITAL_COMMUNITY): Payer: Self-pay

## 2020-11-20 ENCOUNTER — Encounter (HOSPITAL_COMMUNITY): Payer: Self-pay | Admitting: Emergency Medicine

## 2020-11-20 DIAGNOSIS — W01198A Fall on same level from slipping, tripping and stumbling with subsequent striking against other object, initial encounter: Secondary | ICD-10-CM | POA: Insufficient documentation

## 2020-11-20 DIAGNOSIS — I10 Essential (primary) hypertension: Secondary | ICD-10-CM

## 2020-11-20 DIAGNOSIS — Z79899 Other long term (current) drug therapy: Secondary | ICD-10-CM | POA: Insufficient documentation

## 2020-11-20 DIAGNOSIS — M545 Low back pain, unspecified: Secondary | ICD-10-CM | POA: Insufficient documentation

## 2020-11-20 DIAGNOSIS — M25562 Pain in left knee: Secondary | ICD-10-CM | POA: Insufficient documentation

## 2020-11-20 DIAGNOSIS — M25561 Pain in right knee: Secondary | ICD-10-CM | POA: Insufficient documentation

## 2020-11-20 DIAGNOSIS — M25552 Pain in left hip: Secondary | ICD-10-CM | POA: Insufficient documentation

## 2020-11-20 DIAGNOSIS — W19XXXA Unspecified fall, initial encounter: Secondary | ICD-10-CM

## 2020-11-20 DIAGNOSIS — I5031 Acute diastolic (congestive) heart failure: Secondary | ICD-10-CM | POA: Insufficient documentation

## 2020-11-20 DIAGNOSIS — I110 Hypertensive heart disease with heart failure: Secondary | ICD-10-CM | POA: Insufficient documentation

## 2020-11-20 DIAGNOSIS — M25522 Pain in left elbow: Secondary | ICD-10-CM | POA: Insufficient documentation

## 2020-11-20 DIAGNOSIS — Y9259 Other trade areas as the place of occurrence of the external cause: Secondary | ICD-10-CM | POA: Insufficient documentation

## 2020-11-20 DIAGNOSIS — Y93E2 Activity, laundry: Secondary | ICD-10-CM | POA: Insufficient documentation

## 2020-11-20 DIAGNOSIS — Z8616 Personal history of COVID-19: Secondary | ICD-10-CM | POA: Insufficient documentation

## 2020-11-20 MED ORDER — CLONIDINE HCL 0.1 MG PO TABS
0.1000 mg | ORAL_TABLET | Freq: Once | ORAL | Status: AC
  Administered 2020-11-20: 0.1 mg via ORAL
  Filled 2020-11-20: qty 1

## 2020-11-20 MED ORDER — METHOCARBAMOL 500 MG PO TABS: 500.0000 mg | ORAL_TABLET | Freq: Two times a day (BID) | ORAL | 0 refills | Status: DC

## 2020-11-20 MED ORDER — HYDROCODONE-ACETAMINOPHEN 5-325 MG PO TABS
1.0000 | ORAL_TABLET | Freq: Once | ORAL | Status: AC
  Administered 2020-11-20: 1 via ORAL
  Filled 2020-11-20: qty 1

## 2020-11-20 NOTE — ED Provider Notes (Signed)
Emergency Medicine Provider Triage Evaluation Note  Annette Deleon , a 45 y.o. female  was evaluated in triage.  Pt complains of right shoulder pain, left elbow pain, left lumbar back pain, and bilateral knee pain.  Patient reports that she suffered a mechanical fall yesterday.  Patient reports that she slipped and fell.  Patient reports hitting her head but denies any loss of consciousness.  Patient is not on any blood thinners.  Patient has been ambulatory since fall.  Patient reports history of hypertension and pseudotumor cerebri.  Patient reports that she did take her medications today.  Patient has no headache, visual disturbance, chest pain or shortness of breath.  Review of Systems  Positive: Myalgia, arthralgia Negative: Syncope, neck pain, saddle anesthesia, bowel or bladder dysfunction, numbness, weakness  Physical Exam  BP (!) 200/115 (BP Location: Right Arm)   Pulse 91   Temp 97.8 F (36.6 C) (Oral)   Resp 16   Ht 5\' 3"  (1.6 m)   Wt 120.2 kg   LMP 11/10/2020 Comment: pt shielded  SpO2 100%   BMI 46.94 kg/m  Gen:   Awake, no distress   Resp:  Normal effort  MSK:   Moves extremities without difficulty, tenderness to left elbow, tenderness to bilateral knees, tenderness to left lower back.  No midline tenderness or deformity to lumbar, thoracic, or cervical spine.  Head atraumatic. Other:    Medical Decision Making  Medically screening exam initiated at 12:07 PM.  Appropriate orders placed.  [name] was informed that the remainder of the evaluation will be completed by another provider, this initial triage assessment does not replace that evaluation, and the importance of remaining in the ED until their evaluation is complete.  The patient appears stable so that the remainder of the work up may be completed by another provider.      Loni Beckwith, PA-C 11/20/20 1210    Carmin Muskrat, MD 11/21/20 [phone]

## 2020-11-20 NOTE — ED Provider Notes (Signed)
[place] DEPT Provider Note   CSN: [id number] Arrival date & time: 11/20/20  1123     History Chief Complaint  Patient presents with   Back Pain   Hip Pain   Knee Pain   Elbow Pain    Annette Deleon is a 45 y.o. female with PMHx HTN who presents to the ED s/p fall that occurred yesterday.  She was at the [facility] yesterday washing her close.  She states she slipped on sewer drainage causing her to fall backwards and hit her left elbow/head on a washing machine.  No loss of consciousness and patient is not anticoagulated.  She states that since she has been having pain mostly in her left elbow as well as her bilateral knees, left hip, lower back.  She has been taking Tylenol without much relief and states today she is sore all over prompting ED visit.  She does report history of bilateral knee surgeries in the past and was concerned that she could have caused damage to her knees.   The history is provided by the patient and medical records.      Past Medical History:  Diagnosis Date   Hypertension    Migraines    Obesity    Pseudotumor cerebri    Sleep apnea     Patient Active Problem List   Diagnosis Date Noted   COVID-19 virus infection 46/65/9935   Acute diastolic CHF (congestive heart failure) ([place]) 07/03/2020   Chest pain, rule out acute myocardial infarction 06/13/2018   Aortic atherosclerosis ([place])    Pure hypercholesterolemia    Near syncope 70/17/7939   Complicated migraine 03/00/9233   HTN (hypertension) 11/20/2011   Pseudotumor cerebri 11/20/2011   Obesity 11/20/2011    Past Surgical History:  Procedure Laterality Date   ARTHROSCOPIC REPAIR ACL     CESAREAN SECTION     CHOLECYSTECTOMY     KNEE SURGERY     right   MYOMECTOMY       OB History   No obstetric history on file.     Family History  Problem Relation Age of Onset   Heart attack Father 48    Social History   Tobacco Use   Smoking status: Never    Smokeless tobacco: Never  Vaping Use   Vaping Use: Never used  Substance Use Topics   Alcohol use: Yes    Comment: occasionally   Drug use: No    Home Medications Prior to Admission medications   Medication Sig Start Date End Date Taking? Authorizing Provider  acetaminophen (TYLENOL) 500 MG tablet Take 500-1,000 mg by mouth every 6 (six) hours as needed for mild pain.    [provider]  albuterol (PROVENTIL HFA;VENTOLIN HFA) 108 (90 Base) MCG/ACT inhaler Inhale 1 puff into the lungs every 6 (six) hours as needed for wheezing or shortness of breath.  11/19/16   [provider]  atorvastatin (LIPITOR) 40 MG tablet Take 1 tablet (40 mg total) by mouth daily at 6 PM. 06/13/18   Bonnielee Haff, MD  benzonatate (TESSALON) 200 MG capsule Take 200 mg by mouth 3 (three) times daily as needed for cough. 06/01/20   [provider]  Budesonide 90 MCG/ACT inhaler Inhale 1 puff into the lungs every 6 (six) hours as needed (wheezing or shortness of breath). 12/08/19   [provider]  carvedilol (COREG) 25 MG tablet Take 25 mg by mouth 2 (two) times daily. 06/24/19   [provider]  cloNIDine (  CATAPRES) 0.2 MG tablet Take 0.1 mg by mouth daily as needed (if systolic Blood pressure is > 160). 06/30/19   [provider]  diclofenac sodium (VOLTAREN) 1 % GEL Apply 2 g topically daily as needed (pain).    [provider]  diltiazem (CARDIZEM CD) 120 MG 24 hr capsule Take 120 mg by mouth daily. 05/26/20   [provider]  DM-Doxylamine-Acetaminophen (NYQUIL HBP COLD & FLU) 15-6.25-325 MG/15ML LIQD Take 10 mLs by mouth every 8 (eight) hours as needed (rest or cold symptoms).    [provider]  DM-Phenylephrine-Acetaminophen (VICKS DAYQUIL COLD & FLU) 10-5-325 MG/15ML LIQD Take 10 mLs by mouth every 6 (six) hours as needed (cold symptoms).    [provider]  fluticasone (FLONASE) 50 MCG/ACT nasal spray Place 2 sprays into both  nostrils daily as needed for allergies. 06/01/20   [provider]  fluticasone furoate-vilanterol (BREO ELLIPTA) 200-25 MCG/INH AEPB Inhale 1 puff into the lungs daily. 10/02/20   Hunsucker, Bonna Gains, MD  Fluticasone-Salmeterol (AIRDUO RESPICLICK 025/42) 706-23 MCG/ACT AEPB Inhale 1 puff into the lungs daily. 10/18/20   Hunsucker, Bonna Gains, MD  furosemide (LASIX) 20 MG tablet Take 20 mg by mouth daily. 09/21/19   [provider]  gabapentin (NEURONTIN) 300 MG capsule Take 300 mg by mouth at bedtime as needed (pain). 01/17/20   [provider]  guaiFENesin (MUCINEX) 600 MG 12 hr tablet Take 600 mg by mouth 2 (two) times daily as needed for cough or to loosen phlegm.    [provider]  hydrochlorothiazide (HYDRODIURIL) 25 MG tablet Take 25 mg by mouth daily. Patient not taking: Reported on 10/02/2020    [provider]  Liraglutide -Weight Management 18 MG/3ML SOPN Inject 3 mLs into the skin daily.    [provider]  loratadine (CLARITIN) 10 MG tablet Take 10 mg by mouth daily. 01/18/20   [provider]  losartan (COZAAR) 100 MG tablet Take 100 mg by mouth daily.    [provider]  methocarbamol (ROBAXIN) 500 MG tablet Take 1 tablet (500 mg total) by mouth 2 (two) times daily. 11/20/20  Yes Shaine Mount, PA-C    Allergies    Imitrex [sumatriptan base], Prednisone, Wellbutrin [bupropion], Yellow jacket venom [bee venom], Ibuprofen, and Penicillins  Review of Systems   Review of Systems  Constitutional:  Negative for fever.  Gastrointestinal:  Negative for nausea and vomiting.  Musculoskeletal:  Positive for arthralgias and back pain.  Neurological:  Negative for syncope and headaches.  All other systems reviewed and are negative.  Physical Exam Updated Vital Signs BP (!) 208/117   Pulse 79   Temp 97.8 F (36.6 C) (Oral)   Resp 16   Ht 5\' 3"  (1.6 m)   Wt 120.2 kg   LMP 11/10/2020 Comment: pt shielded  SpO2 100%    BMI 46.94 kg/m   Physical Exam Vitals and nursing note reviewed.  Constitutional:      Appearance: She is obese. She is not ill-appearing or diaphoretic.  HENT:     Head: Normocephalic and atraumatic.  Eyes:     Conjunctiva/sclera: Conjunctivae normal.  Cardiovascular:     Rate and Rhythm: Normal rate and regular rhythm.     Pulses: Normal pulses.  Pulmonary:     Effort: Pulmonary effort is normal.     Breath sounds: Normal breath sounds. No wheezing, rhonchi or rales.  Abdominal:     Palpations: Abdomen is soft.     Tenderness: There  is no abdominal tenderness. There is no guarding or rebound.  Musculoskeletal:     Cervical back: Neck supple.     Comments: No C or T midline spinal TTP. + Mild lumbar midline TTP as well as left paralumbar musculature TTP. + left posterior hip TTP. ROM limited at hip joint s/2 pain. + TTP to bilateral knees with crepitus noted with ROM. Strength and sensation intact with good distal pulses.   No obvious swelling appreciated to left elbow however + TTP to same. ROM intact. Strength and sensation intact. 2+ radial pulse.   Skin:    General: Skin is warm and dry.  Neurological:     Mental Status: She is alert.    ED Results / Procedures / Treatments   Labs (all labs ordered are listed, but only abnormal results are displayed) Labs Reviewed - No data to display  EKG None  Radiology DG Lumbar Spine Complete  Result Date: 11/20/2020 CLINICAL DATA:  Status post fall, back pain EXAM: LUMBAR SPINE - COMPLETE 4+ VIEW COMPARISON:  None. FINDINGS: 5 nonrib bearing lumbar-type vertebral bodies. Vertebral body heights are maintained. No acute fracture. 2 mm retrolisthesis of L3 on L4. No spondylolysis. Degenerative disease with mild disc height loss at L5-S1 with bilateral facet arthropathy. Bilateral facet arthropathy at L4-5. SI joints are unremarkable. Abdominal aortic atherosclerosis. IMPRESSION: No acute osseous injury of the lumbar spine.  Electronically Signed   By: Kathreen Devoid   On: 11/20/2020 14:06   DG Elbow Complete Left  Result Date: 11/20/2020 CLINICAL DATA:  Status post fall.  Low back pain.  Elbow pain. EXAM: LEFT ELBOW - COMPLETE 3+ VIEW COMPARISON:  None. FINDINGS: No acute fracture or dislocation. No aggressive osseous lesion. Normal alignment. Tiny peripheral marginal osteophytes of the proximal ulna. Soft tissue are unremarkable. No radiopaque foreign body or soft tissue emphysema. IMPRESSION: No acute osseous injury of the left elbow. Electronically Signed   By: Kathreen Devoid   On: 11/20/2020 13:16   DG Knee Complete 4 Views Left  Result Date: 11/20/2020 CLINICAL DATA:  Status post fall, pain EXAM: LEFT KNEE - COMPLETE 4+ VIEW COMPARISON:  None. FINDINGS: No acute fracture or dislocation. No aggressive osseous lesion. Normal alignment. Prior ACL repair. 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 Mild osteoarthritis of the lateral femorotibial compartment. Mild osteoarthritis of the patellofemoral compartment. No significant joint effusion. Soft tissue are unremarkable. No radiopaque foreign body or soft tissue emphysema. IMPRESSION: No acute osseous injury of the left knee. Electronically Signed   By: Kathreen Devoid   On: 11/20/2020 13:14   DG Knee Complete 4 Views Right  Result Date: 11/20/2020 CLINICAL DATA:  Status post fall, right knee pain EXAM: RIGHT KNEE - COMPLETE 4+ VIEW COMPARISON:  None. FINDINGS: No acute fracture or dislocation. No aggressive osseous lesion. Normal alignment. 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 Mild osteoarthritis of the lateral patellofemoral compartment and patellofemoral compartment. Loose bodies in the suprapatellar joint space. No significant joint effusion. Soft tissue are unremarkable. No radiopaque foreign body or soft tissue emphysema. IMPRESSION: No acute osseous injury of the right knee. Electronically Signed   By: Kathreen Devoid    On: 11/20/2020 13:15   DG Hip Unilat With Pelvis 2-3 Views Left  Result Date: 11/20/2020 CLINICAL DATA:  Left hip pain after fall at [facility] yesterday. EXAM: DG HIP (WITH OR WITHOUT PELVIS) 2-3V LEFT COMPARISON:  None. FINDINGS: No acute fracture femoral head is well seated in the acetabulum. Minor hip joint  space narrowing and acetabular spurring. Pubic rami are intact. Pubic symphysis and sacroiliac joints are congruent. No evidence of a vascular necrosis or focal bone lesion. Regional soft tissues are unremarkable. IMPRESSION: Mild osteoarthritis. No acute fracture. Electronically Signed   By: Keith Rake M.D.   On: 11/20/2020 15:00    Procedures Procedures   Medications Ordered in ED Medications  cloNIDine (CATAPRES) tablet 0.1 mg (0.1 mg Oral Given 11/20/20 1335)  HYDROcodone-acetaminophen (NORCO/VICODIN) 5-325 MG per tablet 1 tablet (1 tablet Oral Given 11/20/20 1335)    ED Course  I have reviewed the triage vital signs and the nursing notes.  Pertinent labs & imaging results that were available during my care of the patient were reviewed by me and considered in my medical decision making (see chart for details).    MDM Rules/Calculators/A&P                          45 year old female who presents to the ED today complaining of pain to her left lower back, left elbow, bilateral knees, left hip status post fall while at the [place] yesterday.  No relief with Tylenol.  On arrival to the ED today patient is afebrile, nontachycardic and nontachypneic.  Blood pressure significantly elevated at 200/115.  Patient states she has been compliant with her blood pressure medication and last took this AM.  She states that she has 0.1 mg clonidine prescribed to her that she is advised to take if her systolic blood pressures greater than 160 however she did not get a chance to this morning.  She is also unsure if it is elevated secondary to pain.  Patient did have positive head injury  however no loss of consciousness and is not anticoagulated.  No concussive symptoms at this time.  She is noted to have tenderness palpation along her left elbow, left hip, bilateral knees, left paralumbar musculature.  She was medically screened and x-rays were obtained including left elbow, bilateral knees, L-spine.  On my exam she is also tender to the left hip, will add on this x-ray at this time.  Will provide clonidine as well as pain medication and reevaluate blood pressure.   Xrays negative at this time Blood pressure slightly decreased at 885O systolic now. Pt without any complaints besides pain. Given she did not come to the ED for HTN I do not feel she requires further workup in the ED. Advised to follow up with PCP for same. Will prescribe short course of muscle relaxer as I suspect general body soreness s/p fall. Pt in agreement with plan and stable for discharge home.   This note was prepared using Dragon voice recognition software and may include unintentional dictation errors due to the inherent limitations of voice recognition software.  Final Clinical Impression(s) / ED Diagnoses Final diagnoses:  Fall, initial encounter  Acute left-sided low back pain without sciatica  Left elbow pain  Acute pain of both knees  Left hip pain  Primary hypertension    Rx / DC Orders ED Discharge Orders          Ordered    methocarbamol (ROBAXIN) 500 MG tablet  2 times daily        11/20/20 1504             Discharge Instructions      Your xrays were very reassuring in the ED today. Your pain may be related to muscle soreness since the fall. Please pick up  muscle relaxer and take as prescribed. DO NOT DRIVE OR DRINK ALCOHOL WHILE ON THIS MEDICATION.   Follow up with your PCP regarding your ED visit today/reevaluation of your high blood pressure  Return to the ED for any new/worsening symptoms       Eustaquio Maize, PA-C 11/20/20 [place], Julie, MD 11/21/20  [phone]

## 2020-11-20 NOTE — ED Triage Notes (Signed)
[place]. She slipped on something on the floor. Today she presents with bilateral knee pain, low back pain, left hip and left elbow pain.

## 2020-11-20 NOTE — Discharge Instructions (Addendum)
Your xrays were very reassuring in the ED today. Your pain may be related to muscle soreness since the fall. Please pick up muscle relaxer and take as prescribed. DO NOT DRIVE OR DRINK ALCOHOL WHILE ON THIS MEDICATION.   Follow up with your PCP regarding your ED visit today/reevaluation of your high blood pressure  Return to the ED for any new/worsening symptoms

## 2020-12-03 ENCOUNTER — Emergency Department (HOSPITAL_COMMUNITY): Payer: Self-pay

## 2020-12-03 ENCOUNTER — Emergency Department (HOSPITAL_COMMUNITY)
Admission: EM | Admit: 2020-12-03 | Discharge: 2020-12-03 | Disposition: A | Discharge status: Home / Self Care | Payer: Self-pay | Attending: Emergency Medicine | Admitting: Emergency Medicine

## 2020-12-03 ENCOUNTER — Encounter (HOSPITAL_COMMUNITY): Payer: Self-pay

## 2020-12-03 ENCOUNTER — Other Ambulatory Visit: Payer: Self-pay

## 2020-12-03 DIAGNOSIS — I110 Hypertensive heart disease with heart failure: Secondary | ICD-10-CM | POA: Insufficient documentation

## 2020-12-03 DIAGNOSIS — I5031 Acute diastolic (congestive) heart failure: Secondary | ICD-10-CM | POA: Insufficient documentation

## 2020-12-03 DIAGNOSIS — Z79899 Other long term (current) drug therapy: Secondary | ICD-10-CM | POA: Insufficient documentation

## 2020-12-03 DIAGNOSIS — Z8616 Personal history of COVID-19: Secondary | ICD-10-CM | POA: Insufficient documentation

## 2020-12-03 DIAGNOSIS — M5442 Lumbago with sciatica, left side: Secondary | ICD-10-CM | POA: Insufficient documentation

## 2020-12-03 MED ORDER — LOSARTAN POTASSIUM 25 MG PO TABS
100.0000 mg | ORAL_TABLET | ORAL | Status: AC
  Administered 2020-12-03: 100 mg via ORAL
  Filled 2020-12-03: qty 4

## 2020-12-03 MED ORDER — TIZANIDINE HCL 4 MG PO TABS
4.0000 mg | ORAL_TABLET | ORAL | Status: AC
  Administered 2020-12-03: 4 mg via ORAL
  Filled 2020-12-03: qty 1

## 2020-12-03 MED ORDER — LIDOCAINE 5 % EX PTCH: 1.0000 | MEDICATED_PATCH | CUTANEOUS | 0 refills | Status: DC

## 2020-12-03 MED ORDER — TIZANIDINE HCL 4 MG PO TABS: 4.0000 mg | ORAL_TABLET | Freq: Four times a day (QID) | ORAL | 0 refills | Status: DC | PRN

## 2020-12-03 MED ORDER — KETOROLAC TROMETHAMINE 30 MG/ML IJ SOLN
30.0000 mg | Freq: Once | INTRAMUSCULAR | Status: AC
  Administered 2020-12-03: 30 mg via INTRAMUSCULAR
  Filled 2020-12-03: qty 1

## 2020-12-03 MED ORDER — DICLOFENAC SODIUM 1 % EX GEL: 4.0000 g | Freq: Four times a day (QID) | CUTANEOUS | 0 refills | Status: DC

## 2020-12-03 MED ORDER — DILTIAZEM HCL ER COATED BEADS 120 MG PO CP24
240.0000 mg | ORAL_CAPSULE | Freq: Once | ORAL | Status: AC
  Administered 2020-12-03: 240 mg via ORAL
  Filled 2020-12-03: qty 2

## 2020-12-03 MED ORDER — ACETAMINOPHEN 500 MG PO TABS
1000.0000 mg | ORAL_TABLET | Freq: Once | ORAL | Status: AC
  Administered 2020-12-03: 1000 mg via ORAL
  Filled 2020-12-03: qty 2

## 2020-12-03 NOTE — ED Triage Notes (Addendum)
Patient c/o left lower back pain and states she was seen for the same on 11/20/20. Patient states the pain shoots into the left leg to the knee.  BP in triage 228/162. Patient states she did not take her BP med today.

## 2020-12-03 NOTE — ED Provider Notes (Signed)
[facility] DEPT Provider Note   CSN: [id number] Arrival date & time: 12/03/20  1030     History Chief Complaint  Patient presents with   Back Pain    Annette Deleon is a 45 y.o. female.  HPI Patient is a 45 year old female with past medical history significant for hypertension, migraines, obesity, intracranial hypertension, sleep apnea  Patient is presenting today with continued low back pain.  She was seen 13 days ago after she fell onto her buttocks was relatively low mechanism injury and she had x-rays obtained which were negative for fracture.  She is discharged home with muscle relaxers lidocaine patch other conservative therapy.  She states that she has been unable to control her pain.  States that the pain is severe.  She arrived today with elevated blood pressure and states she has not taken her blood pressure medications.  She denies any weakness or numbness in lower extremities.  No saddle anesthesia.  She denies any bowel or bladder incontinence or any urgency or frequency.  She denies any difficulty walking although she states it is uncomfortable to do so states that her pain is somewhat relieved by elevating her legs although occasionally she has sharp pains that radiate down her left leg past her knee.      Past Medical History:  Diagnosis Date   Hypertension    Migraines    Obesity    Pseudotumor cerebri    Sleep apnea     Patient Active Problem List   Diagnosis Date Noted   COVID-19 virus infection 51/88/4166   Acute diastolic CHF (congestive heart failure) ([place]) 07/03/2020   Chest pain, rule out acute myocardial infarction 06/13/2018   Aortic atherosclerosis ([place])    Pure hypercholesterolemia    Near syncope 12/01/1599   Complicated migraine 09/32/3557   HTN (hypertension) 11/20/2011   Pseudotumor cerebri 11/20/2011   Obesity 11/20/2011    Past Surgical History:  Procedure Laterality Date   ARTHROSCOPIC REPAIR ACL      CESAREAN SECTION     CHOLECYSTECTOMY     KNEE SURGERY     right   MYOMECTOMY       OB History   No obstetric history on file.     Family History  Problem Relation Age of Onset   Heart attack Father 42    Social History   Tobacco Use   Smoking status: Never   Smokeless tobacco: Never  Vaping Use   Vaping Use: Never used  Substance Use Topics   Alcohol use: Yes    Comment: occasionally   Drug use: No    Home Medications Prior to Admission medications   Medication Sig Start Date End Date Taking? Authorizing Provider  diclofenac Sodium (VOLTAREN) 1 % GEL Apply 4 g topically 4 (four) times daily. 12/03/20  Yes Yazlin Ekblad S, PA  lidocaine (LIDODERM) 5 % Place 1 patch onto the skin daily. Remove & Discard patch within 12 hours or as directed by MD 12/03/20  Yes Andjela Wickes, Kathleene Hazel, PA  tiZANidine (ZANAFLEX) 4 MG tablet Take 1 tablet (4 mg total) by mouth every 6 (six) hours as needed for muscle spasms. 12/03/20  Yes Quetzaly Ebner, Ova Freshwater S, PA  acetaminophen (TYLENOL) 500 MG tablet Take 500-1,000 mg by mouth every 6 (six) hours as needed for mild pain.    [provider]  albuterol (PROVENTIL HFA;VENTOLIN HFA) 108 (90 Base) MCG/ACT inhaler Inhale 1 puff into the lungs every 6 (six) hours as needed for wheezing  or shortness of breath.  11/19/16   [provider]  atorvastatin (LIPITOR) 40 MG tablet Take 1 tablet (40 mg total) by mouth daily at 6 PM. 06/13/18   Bonnielee Haff, MD  benzonatate (TESSALON) 200 MG capsule Take 200 mg by mouth 3 (three) times daily as needed for cough. 06/01/20   [provider]  Budesonide 90 MCG/ACT inhaler Inhale 1 puff into the lungs every 6 (six) hours as needed (wheezing or shortness of breath). 12/08/19   [provider]  carvedilol (COREG) 25 MG tablet Take 25 mg by mouth 2 (two) times daily. 06/24/19   [provider]  cloNIDine (CATAPRES) 0.2 MG tablet Take 0.1 mg by mouth daily as needed (if systolic Blood pressure  is > 160). 06/30/19   [provider]  diltiazem (CARDIZEM CD) 120 MG 24 hr capsule Take 120 mg by mouth daily. 05/26/20   [provider]  DM-Doxylamine-Acetaminophen (NYQUIL HBP COLD & FLU) 15-6.25-325 MG/15ML LIQD Take 10 mLs by mouth every 8 (eight) hours as needed (rest or cold symptoms).    [provider]  DM-Phenylephrine-Acetaminophen (VICKS DAYQUIL COLD & FLU) 10-5-325 MG/15ML LIQD Take 10 mLs by mouth every 6 (six) hours as needed (cold symptoms).    [provider]  fluticasone (FLONASE) 50 MCG/ACT nasal spray Place 2 sprays into both nostrils daily as needed for allergies. 06/01/20   [provider]  fluticasone furoate-vilanterol (BREO ELLIPTA) 200-25 MCG/INH AEPB Inhale 1 puff into the lungs daily. 10/02/20   Hunsucker, Bonna Gains, MD  Fluticasone-Salmeterol (AIRDUO RESPICLICK 622/63) 335-45 MCG/ACT AEPB Inhale 1 puff into the lungs daily. 10/18/20   Hunsucker, Bonna Gains, MD  furosemide (LASIX) 20 MG tablet Take 20 mg by mouth daily. 09/21/19   [provider]  gabapentin (NEURONTIN) 300 MG capsule Take 300 mg by mouth at bedtime as needed (pain). 01/17/20   [provider]  guaiFENesin (MUCINEX) 600 MG 12 hr tablet Take 600 mg by mouth 2 (two) times daily as needed for cough or to loosen phlegm.    [provider]  hydrochlorothiazide (HYDRODIURIL) 25 MG tablet Take 25 mg by mouth daily. Patient not taking: Reported on 10/02/2020    [provider]  Liraglutide -Weight Management 18 MG/3ML SOPN Inject 3 mLs into the skin daily.    [provider]  loratadine (CLARITIN) 10 MG tablet Take 10 mg by mouth daily. 01/18/20   [provider]  losartan (COZAAR) 100 MG tablet Take 100 mg by mouth daily.    [provider]  methocarbamol (ROBAXIN) 500 MG tablet Take 1 tablet (500 mg total) by mouth 2 (two) times daily. 11/20/20   Eustaquio Maize, PA-C    Allergies    Imitrex [sumatriptan base],  Prednisone, Wellbutrin [bupropion], Yellow jacket venom [bee venom], Ibuprofen, and Penicillins  Review of Systems   Review of Systems  Constitutional:  Negative for chills and fever.  HENT:  Negative for congestion.   Eyes:  Negative for pain.  Respiratory:  Negative for cough and shortness of breath.   Cardiovascular:  Negative for chest pain and leg swelling.  Gastrointestinal:  Negative for abdominal distention, abdominal pain and vomiting.  Genitourinary:  Negative for dysuria.  Musculoskeletal:  Positive for back pain. Negative for myalgias.  Skin:  Negative for rash.  Neurological:  Negative for dizziness and headaches.   Physical Exam Updated Vital Signs BP (!) 146/102   Pulse 70   Temp 98.2 F (36.8 C) (Oral)   Resp  18   Ht 5\' 3"  (1.6 m)   Wt 119.7 kg   LMP 11/10/2020 Comment: preg test waiver signed 11/20/20  SpO2 100%   BMI 46.77 kg/m   Physical Exam Vitals and nursing note reviewed.  Constitutional:      General: She is not in acute distress.    Appearance: Normal appearance. She is not ill-appearing.  HENT:     Head: Normocephalic and atraumatic.  Eyes:     General: No scleral icterus.       Right eye: No discharge.        Left eye: No discharge.     Conjunctiva/sclera: Conjunctivae normal.  Pulmonary:     Effort: Pulmonary effort is normal.     Breath sounds: No stridor.  Abdominal:     Tenderness: There is no abdominal tenderness. There is no guarding or rebound.  Skin:    General: Skin is warm and dry.  Neurological:     Mental Status: She is alert and oriented to person, place, and time. Mental status is at baseline.     Comments: Sensation intact bilateral lower extremities.  Flexion extension of the knee 5/5. Seems to have some discomfort with standing up however ambulates without difficulty.  Bilateral patellar reflexes 3+    ED Results / Procedures / Treatments   Labs (all labs ordered are listed, but only abnormal results are  displayed) Labs Reviewed - No data to display  EKG None  Radiology CT Lumbar Spine Wo Contrast  Result Date: 12/03/2020 CLINICAL DATA:  Low back pain extending to the left leg. Negative radiographs. EXAM: CT LUMBAR SPINE WITHOUT CONTRAST TECHNIQUE: Multidetector CT imaging of the lumbar spine was performed without intravenous contrast administration. Multiplanar CT image reconstructions were also generated. COMPARISON:  12/03/2020 radiographs.  CT abdomen 07/07/2020. FINDINGS: Segmentation: The lowest lumbar type non-rib-bearing vertebra is labeled as L5. Alignment: No vertebral subluxation is observed. Vertebrae: No fracture or acute vertebral findings. Paraspinal and other soft tissues: Aortoiliac atherosclerotic vascular disease. Uterine fibroids. Disc levels: T12-L1: Unremarkable. L1-2: Unremarkable. L2-3: Unremarkable. L3-4: Unremarkable. L4-5: Moderate left and mild right foraminal stenosis due to disc bulge and facet arthropathy. L5-S1: Moderate left and mild right foraminal stenosis due to facet arthropathy. IMPRESSION: 1. Lumbar spondylosis and degenerative disc disease contributing to moderate impingement at L4-5 and L5-S1. 2.  Aortic Atherosclerosis (ICD10-I70.0). 3. Uterine fibroids. 4. No acute findings. Electronically Signed   By: Van Clines M.D.   On: 12/03/2020 14:47    Procedures Procedures   Medications Ordered in ED Medications  ketorolac (TORADOL) 30 MG/ML injection 30 mg (30 mg Intramuscular Given 12/03/20 1319)  losartan (COZAAR) tablet 100 mg (100 mg Oral Given 12/03/20 1320)  acetaminophen (TYLENOL) tablet 1,000 mg (1,000 mg Oral Given 12/03/20 1320)  diltiazem (CARDIZEM CD) 24 hr capsule 240 mg (240 mg Oral Given 12/03/20 1320)  tiZANidine (ZANAFLEX) tablet 4 mg (4 mg Oral Given 12/03/20 1320)    ED Course  I have reviewed the triage vital signs and the nursing notes.  Pertinent labs & imaging results that were available during my care of the patient were reviewed by  me and considered in my medical decision making (see chart for details).    MDM Rules/Calculators/A&P                          Patient is 45 year old female with low back pain seems to start after a traumatic event although this was present 2  weeks ago. Physical exam reassuring.  Broad differential for back pain considered includes malignancy, disc herniation, spinal epidural abscess, spinal fracture, cauda equina, pyelonephritis, kidney stone, AAA, AD, pancreatitis, PE and PTX.   History without symptoms of urinary or stool retention or incontinence, neurologic changes such as sensation change or weakness lower extremities, coagulopathy or blood thinner use, is not elderly or with history of osteoporosis, denies any history of cancer, fever, IV drug use, weight changes (unexplained), or prolonged steroid use.   Physical exam concerning for muscle strain versus some sciatica some question of protruding disc given his sciatic symptoms.  Doubt cauda equina d/t lack of saddle anesthesia/bowel or bladder incontinence or urinary retention, normal gait and reassuring physical examination without neurologic deficits.   History is not supportive of kidney stone, AAA, AD, pancreatitis, PE or PTX. Patient has no CVA tenderness or urinary sx to suggest pyelonephritis or kidney stone.   Will manage patient conservatively at this time. NSAIDs, back exercises/stretches, heat therapy and follow up with PCP if symptoms do not resolve in 3-4 weeks. Patient offered muscle relaxer for comfort at night. Counseled on need to return to ED for fever, worsening or concerning symptoms. Patient agreeable to plan and states understanding of follow up plans and return precautions.      Vitals WNL at time of discharge apart from some continued elevated blood pressure but much improved and patient is no acute distress   Offered Toradol shot which she accepted was also given 1 dose of tizanidine and 1 dose of  Tylenol.   IMPRESSION:  1. Lumbar spondylosis and degenerative disc disease contributing to  moderate impingement at L4-5 and L5-S1.  2.  Aortic Atherosclerosis (ICD10-I70.0).  3. Uterine fibroids.  4. No acute findings.   Patient's blood pressure is significantly elevated here in the ER she did not take her blood pressure medication she was given some of her blood pressure medications here and her blood pressure improved from systolic of 998 to a systolic of 338.  She is not having any symptoms of chest pain shortness of breath headache and I doubt that she is having any hypertensive urgency/emergency here in the ER.  Her blood pressure improved will discharge home she will return to the ER for any new or concerning symptoms.  Counseled to follow-up with PCP as well as neurosurgeon who she was given information for.  Switch to Zanaflex from Robaxin and given Lidoderm patches and Voltaren gel.  Offered prednisone however patient states that she was not supposed to take any prednisone because she is going to do PFT testing after "long COVID "symptoms.  With pulmonologist and was told not to take any prednisone as it may alter her results.  Final Clinical Impression(s) / ED Diagnoses Final diagnoses:  Acute low back pain with left-sided sciatica, unspecified back pain laterality    Rx / DC Orders ED Discharge Orders          Ordered    tiZANidine (ZANAFLEX) 4 MG tablet  Every 6 hours PRN        12/03/20 1500    lidocaine (LIDODERM) 5 %  Every 24 hours        12/03/20 1500    diclofenac Sodium (VOLTAREN) 1 % GEL  4 times daily        12/03/20 1500             Pati Gallo [place], [place] 12/05/20 [place]    Wyvonnia Dusky, MD 12/06/20 [phone]

## 2020-12-03 NOTE — Discharge Instructions (Addendum)
Your x-ray is negative for any fractures.  Your CT scan was also negative for fractures.  You do have some evidence of potentially a pinched nerve in your lumbar spine.  As we discussed prednisone may be helpful for this however it is reasonable to hold off given that you are having lung function testing seen.  Please do warm compresses and use voltaren gel. I have also prescribed a muscle relaxer to use.   I have also given you the information for a neurosurgeon who you may follow up with .

## 2020-12-13 ENCOUNTER — Other Ambulatory Visit: Payer: Self-pay

## 2020-12-13 ENCOUNTER — Emergency Department (HOSPITAL_BASED_OUTPATIENT_CLINIC_OR_DEPARTMENT_OTHER)
Admission: EM | Admit: 2020-12-13 | Discharge: 2020-12-13 | Disposition: A | Discharge status: Home / Self Care | Payer: Self-pay | Attending: Emergency Medicine | Admitting: Emergency Medicine

## 2020-12-13 ENCOUNTER — Encounter (HOSPITAL_BASED_OUTPATIENT_CLINIC_OR_DEPARTMENT_OTHER): Payer: Self-pay

## 2020-12-13 DIAGNOSIS — Z79899 Other long term (current) drug therapy: Secondary | ICD-10-CM | POA: Insufficient documentation

## 2020-12-13 DIAGNOSIS — I5031 Acute diastolic (congestive) heart failure: Secondary | ICD-10-CM | POA: Insufficient documentation

## 2020-12-13 DIAGNOSIS — Z86011 Personal history of benign neoplasm of the brain: Secondary | ICD-10-CM | POA: Insufficient documentation

## 2020-12-13 DIAGNOSIS — Z8616 Personal history of COVID-19: Secondary | ICD-10-CM | POA: Insufficient documentation

## 2020-12-13 DIAGNOSIS — I110 Hypertensive heart disease with heart failure: Secondary | ICD-10-CM | POA: Insufficient documentation

## 2020-12-13 DIAGNOSIS — M79674 Pain in right toe(s): Secondary | ICD-10-CM | POA: Insufficient documentation

## 2020-12-13 MED ORDER — DICLOFENAC SODIUM 1 % EX GEL: 4.0000 g | Freq: Four times a day (QID) | CUTANEOUS | 0 refills | Status: DC

## 2020-12-13 MED ORDER — OXYCODONE HCL 5 MG PO TABS: 5.0000 mg | ORAL_TABLET | Freq: Four times a day (QID) | ORAL | 0 refills | Status: DC | PRN

## 2020-12-13 MED ORDER — PREDNISONE 10 MG (21) PO TBPK: ORAL_TABLET | Freq: Every day | ORAL | 0 refills | Status: DC

## 2020-12-13 MED ORDER — HYDROCODONE-ACETAMINOPHEN 5-325 MG PO TABS
1.0000 | ORAL_TABLET | Freq: Once | ORAL | Status: AC
  Administered 2020-12-13: 1 via ORAL
  Filled 2020-12-13 (×2): qty 1

## 2020-12-13 NOTE — ED Provider Notes (Signed)
[facility] EMERGENCY DEPARTMENT Provider Note   CSN: [id number] Arrival date & time: 12/13/20  1616     History Chief Complaint  Patient presents with   Gout    Annette Deleon is a 45 y.o. female.  HPI Patient with right great toe pain history of gout she states that she has had injections by her orthopedist in the past.  She states this is helped tremendously.  She has been taking Tylenol and Aleve without significant relief.   She states that she is twice before had intra-articular injections of steroids which is helped tremendously.  She states that she was unable to get in with her orthopedist.  She states that the pain has been going on for 2 days she states that it is so painful that she is unable to have a sheet touching it.  She states it hurts to walk and she is having difficulty walking because of her pain.    Denies any fevers or chills no concern for STDs including gonorrhea.  No other associate symptoms.  No aggravating mitigating factors apart from those discussed above which include touch and movement.    Past Medical History:  Diagnosis Date   Hypertension    Migraines    Obesity    Pseudotumor cerebri    Sleep apnea     Patient Active Problem List   Diagnosis Date Noted   COVID-19 virus infection 60/73/7106   Acute diastolic CHF (congestive heart failure) ([place]) 07/03/2020   Chest pain, rule out acute myocardial infarction 06/13/2018   Aortic atherosclerosis ([place] [place])    Pure hypercholesterolemia    Near syncope 26/94/8546   Complicated migraine 27/08/5007   HTN (hypertension) 11/20/2011   Pseudotumor cerebri 11/20/2011   Obesity 11/20/2011    Past Surgical History:  Procedure Laterality Date   ARTHROSCOPIC REPAIR ACL     CESAREAN SECTION     CHOLECYSTECTOMY     KNEE SURGERY     right   MYOMECTOMY       OB History   No obstetric history on file.     Family History  Problem Relation Age of Onset   Heart attack Father 48     Social History   Tobacco Use   Smoking status: Never   Smokeless tobacco: Never  Vaping Use   Vaping Use: Never used  Substance Use Topics   Alcohol use: Yes    Comment: occasionally   Drug use: No    Home Medications Prior to Admission medications   Medication Sig Start Date End Date Taking? Authorizing Provider  diclofenac Sodium (VOLTAREN) 1 % GEL Apply 4 g topically 4 (four) times daily. 12/13/20  Yes Nohelani Benning S, PA  predniSONE (STERAPRED UNI-PAK 21 TAB) 10 MG (21) TBPK tablet Take by mouth daily. Take 6 tabs by mouth daily  for 2 days, then 5 tabs for 2 days, then 4 tabs for 2 days, then 3 tabs for 2 days, 2 tabs for 2 days, then 1 tab by mouth daily for 2 days 12/13/20  Yes Carleen Rhue S, PA  acetaminophen (TYLENOL) 500 MG tablet Take 500-1,000 mg by mouth every 6 (six) hours as needed for mild pain.    [provider]  albuterol (PROVENTIL HFA;VENTOLIN HFA) 108 (90 Base) MCG/ACT inhaler Inhale 1 puff into the lungs every 6 (six) hours as needed for wheezing or shortness of breath.  11/19/16   [provider]  atorvastatin (LIPITOR) 40 MG tablet Take 1 tablet (40 mg  total) by mouth daily at 6 PM. 06/13/18   Bonnielee Haff, MD  benzonatate (TESSALON) 200 MG capsule Take 200 mg by mouth 3 (three) times daily as needed for cough. 06/01/20   [provider]  Budesonide 90 MCG/ACT inhaler Inhale 1 puff into the lungs every 6 (six) hours as needed (wheezing or shortness of breath). 12/08/19   [provider]  carvedilol (COREG) 25 MG tablet Take 25 mg by mouth 2 (two) times daily. 06/24/19   [provider]  cloNIDine (CATAPRES) 0.2 MG tablet Take 0.1 mg by mouth daily as needed (if systolic Blood pressure is > 160). 06/30/19   [provider]  diltiazem (CARDIZEM CD) 120 MG 24 hr capsule Take 120 mg by mouth daily. 05/26/20   [provider]  DM-Doxylamine-Acetaminophen (NYQUIL HBP COLD & FLU) 15-6.25-325 MG/15ML LIQD  Take 10 mLs by mouth every 8 (eight) hours as needed (rest or cold symptoms).    [provider]  DM-Phenylephrine-Acetaminophen (VICKS DAYQUIL COLD & FLU) 10-5-325 MG/15ML LIQD Take 10 mLs by mouth every 6 (six) hours as needed (cold symptoms).    [provider]  fluticasone (FLONASE) 50 MCG/ACT nasal spray Place 2 sprays into both nostrils daily as needed for allergies. 06/01/20   [provider]  fluticasone furoate-vilanterol (BREO ELLIPTA) 200-25 MCG/INH AEPB Inhale 1 puff into the lungs daily. 10/02/20   Hunsucker, Bonna Gains, MD  Fluticasone-Salmeterol (AIRDUO RESPICLICK 546/27) 035-00 MCG/ACT AEPB Inhale 1 puff into the lungs daily. 10/18/20   Hunsucker, Bonna Gains, MD  furosemide (LASIX) 20 MG tablet Take 20 mg by mouth daily. 09/21/19   [provider]  gabapentin (NEURONTIN) 300 MG capsule Take 300 mg by mouth at bedtime as needed (pain). 01/17/20   [provider]  guaiFENesin (MUCINEX) 600 MG 12 hr tablet Take 600 mg by mouth 2 (two) times daily as needed for cough or to loosen phlegm.    [provider]  hydrochlorothiazide (HYDRODIURIL) 25 MG tablet Take 25 mg by mouth daily. Patient not taking: Reported on 10/02/2020    [provider]  lidocaine (LIDODERM) 5 % Place 1 patch onto the skin daily. Remove & Discard patch within 12 hours or as directed by MD 12/03/20   Tedd Sias, PA  Liraglutide -Weight Management 18 MG/3ML SOPN Inject 3 mLs into the skin daily.    [provider]  loratadine (CLARITIN) 10 MG tablet Take 10 mg by mouth daily. 01/18/20   [provider]  losartan (COZAAR) 100 MG tablet Take 100 mg by mouth daily.    [provider]  methocarbamol (ROBAXIN) 500 MG tablet Take 1 tablet (500 mg total) by mouth 2 (two) times daily. 11/20/20   Alroy Bailiff, Margaux, PA-C  tiZANidine (ZANAFLEX) 4 MG tablet Take 1 tablet (4 mg total) by mouth every 6 (six) hours as needed for muscle spasms. 12/03/20    Tedd Sias, PA    Allergies    Imitrex [sumatriptan base], Prednisone, Wellbutrin [bupropion], Yellow jacket venom [bee venom], Ibuprofen, and Penicillins  Review of Systems   Review of Systems  Constitutional:  Negative for fever.  HENT:  Negative for congestion.   Respiratory:  Negative for shortness of breath.   Cardiovascular:  Negative for chest pain.  Gastrointestinal:  Negative for abdominal distention.  Musculoskeletal:        R great toe pain/swelling   Neurological:  Negative for dizziness and headaches.   Physical Exam Updated Vital Signs BP (!) 155/92 (BP  Location: Left Arm)   Pulse 77   Temp 98.5 F (36.9 C) (Oral)   Resp 16   Ht 5\' 3"  (1.6 m)   Wt 113.4 kg   LMP 12/06/2020   SpO2 100%   BMI 44.29 kg/m   Physical Exam Vitals and nursing note reviewed.  Constitutional:      General: She is not in acute distress.    Appearance: Normal appearance. She is not ill-appearing.  HENT:     Head: Normocephalic and atraumatic.  Eyes:     General: No scleral icterus.       Right eye: No discharge.        Left eye: No discharge.     Conjunctiva/sclera: Conjunctivae normal.  Pulmonary:     Effort: Pulmonary effort is normal.     Breath sounds: No stridor.  Musculoskeletal:     Comments: Patient with somewhat swollen right great toe.  Very tender to touch.  Is able to wiggle the toe some.  Has good sensation distally and good cap refill.  No bruising or evidence of trauma.  No abrasions or lacerations.  Skin:    General: Skin is warm and dry.  Neurological:     Mental Status: She is alert and oriented to person, place, and time. Mental status is at baseline.    ED Results / Procedures / Treatments   Labs (all labs ordered are listed, but only abnormal results are displayed) Labs Reviewed - No data to display  EKG None  Radiology No results found.  Procedures Procedures   Medications Ordered in ED Medications  HYDROcodone-acetaminophen  (NORCO/VICODIN) 5-325 MG per tablet 1 tablet (1 tablet Oral Given 12/13/20 1740)    ED Course  I have reviewed the triage vital signs and the nursing notes.  Pertinent labs & imaging results that were available during my care of the patient were reviewed by me and considered in my medical decision making (see chart for details).    MDM Rules/Calculators/A&P                          Patient with right great toe pain history of gout she states that she has had injections by her orthopedist in the past.  She states this is helped tremendously.  She has been taking Tylenol and Aleve without significant relief.  Patient has good distal neurovascular exam.  Her exam is consistent with gout.  I have low suspicion for septic arthritis.  Return precautions are given and she will follow-up closely with her orthopedist as well as her PCP.  Return precautions to the ER were given as well.  We will treat with prednisone and Voltaren gel.  We will also give very short course of oxycodone.  6 tablets total.  Patient understands the need for close follow-up with orthopedist.  Ambulatory time of discharge.  Final Clinical Impression(s) / ED Diagnoses Final diagnoses:  Great toe pain, right    Rx / DC Orders ED Discharge Orders          Ordered    predniSONE (STERAPRED UNI-PAK 21 TAB) 10 MG (21) TBPK tablet  Daily        12/13/20 1747    diclofenac Sodium (VOLTAREN) 1 % GEL  4 times daily        12/13/20 1747             Tedd Sias, [place] 12/13/20 1753    Quintella Reichert, MD 12/13/20  1901  

## 2020-12-13 NOTE — ED Triage Notes (Signed)
Pt c/o gout to right foot day 2-NAD-steady gait

## 2020-12-13 NOTE — Discharge Instructions (Addendum)
Please follow-up with the orthopedist who has done your joint injections in the past.  Please use cool compresses, take Tylenol and ibuprofen as discussed below and call your primary care provider to talk about holding off on her diuretic medication.  Do not drink any alcohol and refrain from any seafood or meat for the next few days.  Drink plenty of water.  I prescribed you a short course of prednisone to take.  Please take it as instructed.  It will be a tapered dose.

## 2021-02-15 ENCOUNTER — Other Ambulatory Visit: Payer: Self-pay

## 2021-02-15 ENCOUNTER — Ambulatory Visit (INDEPENDENT_AMBULATORY_CARE_PROVIDER_SITE_OTHER): Payer: Self-pay | Admitting: Pulmonary Disease

## 2021-02-15 ENCOUNTER — Encounter: Payer: Self-pay | Admitting: Pulmonary Disease

## 2021-02-15 VITALS — BP 132/84 | HR 69 | Ht 64.0 in | Wt 279.6 lb

## 2021-02-15 DIAGNOSIS — J4540 Moderate persistent asthma, uncomplicated: Secondary | ICD-10-CM

## 2021-02-15 DIAGNOSIS — R0600 Dyspnea, unspecified: Secondary | ICD-10-CM

## 2021-02-15 DIAGNOSIS — J4520 Mild intermittent asthma, uncomplicated: Secondary | ICD-10-CM

## 2021-02-15 DIAGNOSIS — R0609 Other forms of dyspnea: Secondary | ICD-10-CM

## 2021-02-15 LAB — PULMONARY FUNCTION TEST
DL/VA % pred: 111 %
DL/VA: 4.85 ml/min/mmHg/L
DLCO cor % pred: 87 %
DLCO cor: 18.54 ml/min/mmHg
DLCO unc % pred: 87 %
DLCO unc: 18.54 ml/min/mmHg
FEF 25-75 Post: 2.31 L/sec
FEF 25-75 Pre: 2.63 L/sec
FEF2575-%Change-Post: -12 %
FEF2575-%Pred-Post: 90 %
FEF2575-%Pred-Pre: 102 %
FEV1-%Change-Post: -3 %
FEV1-%Pred-Post: 88 %
FEV1-%Pred-Pre: 91 %
FEV1-Post: 2.12 L
FEV1-Pre: 2.19 L
FEV1FVC-%Change-Post: 3 %
FEV1FVC-%Pred-Pre: 102 %
FEV6-%Change-Post: -6 %
FEV6-%Pred-Post: 84 %
FEV6-%Pred-Pre: 89 %
FEV6-Post: 2.42 L
FEV6-Pre: 2.59 L
FEV6FVC-%Change-Post: 0 %
FEV6FVC-%Pred-Post: 102 %
FEV6FVC-%Pred-Pre: 101 %
FVC-%Change-Post: -6 %
FVC-%Pred-Post: 82 %
FVC-%Pred-Pre: 88 %
FVC-Post: 2.44 L
FVC-Pre: 2.61 L
Post FEV1/FVC ratio: 87 %
Post FEV6/FVC ratio: 100 %
Pre FEV1/FVC ratio: 84 %
Pre FEV6/FVC Ratio: 99 %
RV % pred: 81 %
RV: 1.4 L
TLC % pred: 82 %
TLC: 4.16 L

## 2021-02-15 NOTE — Progress Notes (Signed)
$'@Patient'I$  ID: Annette Deleon, female    DOB: 07-16-1975, 45 y.o.   MRN: [id number]  Chief Complaint  Patient presents with   Follow-up    F/U after PFT. States her breathing has improved since last visit.     Referring provider: Jolinda Croak, MD  HPI:   45 year old whom we are seeing in consultation for evaluation of persistent cough.  ED note x 4 reviewed for MSK complaints in interim since last visit.  Prescribed Breo last visit.  Unobtainable due to insurance teasers.  Prescribed air duo.  She reports good adherence to this.  Her breathing is much improved.  No real complaints today.  She feels like she can get back to work.  Note provided.  Unfortunately, she spent majority the summer without air conditioning in her rental.  Sounds like will be fixed soon.  Had some success with small AC unit in terms of keeping her house cool in the heat of the summer.  Reviewed PFTs in detail with patient today.  PFTs normal.  HPI at initial visit: Patient with onset of chronic cough since January 2021.  This was in the setting of COVID-19 virus diagnosis.  Since then has had persistent cough.  Described largely as coughing fits.  Usually nonproductive, dry.  Worse with deep breaths.  No clear environmental factors that make things better or worse.  No timing during the day when things are better or worse.  Not much change or worse or better with change in season, pollen.  She does note chronic allergies as well as getting hives intermittently.  Tessalon Perles as well as Robitussin helps a little but not immensely.  She uses albuterol as needed both nebulized and inhaler with mild improvement.  She is now higher doses of prednisone over the last couple months without any real improvement in symptoms.  Chronically takes prednisone 5 mg daily for knee pain per her orthopedist.  Severity of cough described as severe.  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  She desperately desires to return to work and wishes to return to work.  Reviewed serial chest images.  Most recent chest x-ray 07/03/2020 reviewed interpreted as clear lungs without infiltrate, effusion, pneumothorax.  CT angio PE protocol chest 07/03/2020 reviewed and interpreted as clear lungs and no evidence of pulmonary embolus.  TTE 07/04/2020 with grade 2 diastolic dysfunction, elevated left atrial pressure, moderately dilated LA, moderate MVR, normal RV size and systolic function as well as normal RA size.  PMH: Hypertension, seasonal allergies, asthma, diastolic CHF Surgical history: C-section, cholecystectomy Family history: CAD in father, no significant respiratory illnesses in first-degree relatives on review Social history: Never smoker, lives in [place] / Pulmonary Flowsheets:   ACT:  No flowsheet data found.  MMRC: No flowsheet data found.  Epworth:  No flowsheet data found.  Tests:   FENO:  No results found for: NITRICOXIDE  PFT: PFT Results Latest Ref Rng & Units 02/15/2021  FVC-Pre L 2.61  FVC-Predicted Pre % 88  FVC-Post L 2.44  FVC-Predicted Post % 82  Pre FEV1/FVC % % 84  Post FEV1/FCV % % 87  FEV1-Pre L 2.19  FEV1-Predicted Pre % 91  FEV1-Post L 2.12  DLCO uncorrected ml/min/mmHg 18.54  DLCO UNC% % 87  DLCO corrected ml/min/mmHg 18.54  DLCO COR %Predicted % 87  DLVA  Predicted % 111  TLC L 4.16  TLC % Predicted % 82  RV % Predicted % 81  Personally reviewed and interpreted as normal spirometry, normal lung volumes, DLCO within normal limits.  WALK:  No flowsheet data found.  Imaging: No results found.  Lab Results: Personally reviewed, recently no elevated eosinophils although were 300 in 2016 CBC    Component Value Date/Time   WBC 11.4 (H) 11/10/2020 1928   RBC 4.94 11/10/2020 1928   HGB 12.7 11/10/2020 1928   HCT 39.1 11/10/2020 1928   PLT 489 (H)  11/10/2020 1928   MCV 79.1 (L) 11/10/2020 1928   MCH 25.7 (L) 11/10/2020 1928   MCHC 32.5 11/10/2020 1928   RDW 15.3 11/10/2020 1928   LYMPHSABS 1.8 11/10/2020 1928   MONOABS 0.7 11/10/2020 1928   EOSABS 0.2 11/10/2020 1928   BASOSABS 0.1 11/10/2020 1928    BMET    Component Value Date/Time   NA 138 11/10/2020 1928   K 3.6 11/10/2020 1928   CL 105 11/10/2020 1928   CO2 22 11/10/2020 1928   GLUCOSE 110 (H) 11/10/2020 1928   BUN 15 11/10/2020 1928   CREATININE 0.98 11/10/2020 1928   CALCIUM 9.3 11/10/2020 1928   GFRNONAA >60 11/10/2020 1928   GFRAA >60 06/12/2018 1941    BNP    Component Value Date/Time   BNP 455.1 (H) 07/03/2020 1209    ProBNP No results found for: PROBNP  Specialty Problems   None  Allergies  Allergen Reactions   Imitrex [Sumatriptan Base] Anaphylaxis   Prednisone Palpitations   Wellbutrin [Bupropion] Anaphylaxis   Yellow Jacket Venom [Bee Venom] Hives   Ibuprofen Nausea And Vomiting   Penicillins Hives    Has patient had a PCN reaction causing immediate rash, facial/tongue/throat swelling, SOB or lightheadedness with hypotension: yes Has patient had a PCN reaction causing severe rash involving mucus membranes or skin necrosis: yes Has patient had a PCN reaction that required hospitalization: no Has patient had a PCN reaction occurring within the last 10 years: yes If all of the above answers are "NO", then may proceed with Cephalosporin use.    Immunization History  Administered Date(s) Administered   PFIZER(Purple Top)SARS-COV-2 Vaccination 09/23/2019, 10/14/2019   Tdap 05/31/2014, 07/04/2019    Past Medical History:  Diagnosis Date   Hypertension    Migraines    Obesity    Pseudotumor cerebri    Sleep apnea     Tobacco History: Social History   Tobacco Use  Smoking Status Never  Smokeless Tobacco Never   Counseling given: Not Answered   Continue to not smoke  Outpatient Encounter Medications as of 02/15/2021   Medication Sig   acetaminophen (TYLENOL) 500 MG tablet Take 500-1,000 mg by mouth every 6 (six) hours as needed for mild pain.   albuterol (PROVENTIL HFA;VENTOLIN HFA) 108 (90 Base) MCG/ACT inhaler Inhale 1 puff into the lungs every 6 (six) hours as needed for wheezing or shortness of breath.    atorvastatin (LIPITOR) 40 MG tablet Take 1 tablet (40 mg total) by mouth daily at 6 PM.   benzonatate (TESSALON) 200 MG capsule Take 200 mg by mouth 3 (three) times daily as needed for cough.   Budesonide 90 MCG/ACT inhaler Inhale 1 puff into the lungs every 6 (six) hours as needed (wheezing or shortness of breath).   carvedilol (COREG) 25 MG tablet Take 25 mg by mouth 2 (two) times daily.   cloNIDine (CATAPRES) 0.2 MG tablet Take 0.1 mg by mouth daily  as needed (if systolic Blood pressure is > 160).   diclofenac Sodium (VOLTAREN) 1 % GEL Apply 4 g topically 4 (four) times daily.   diltiazem (CARDIZEM CD) 120 MG 24 hr capsule Take 120 mg by mouth daily.   fluticasone (FLONASE) 50 MCG/ACT nasal spray Place 2 sprays into both nostrils daily as needed for allergies.   Fluticasone-Salmeterol (AIRDUO RESPICLICK [id number]) [id number] MCG/ACT AEPB Inhale 1 puff into the lungs daily.   furosemide (LASIX) 20 MG tablet Take 20 mg by mouth daily.   gabapentin (NEURONTIN) 300 MG capsule Take 300 mg by mouth at bedtime as needed (pain).   guaiFENesin (MUCINEX) 600 MG 12 hr tablet Take 600 mg by mouth 2 (two) times daily as needed for cough or to loosen phlegm.   hydrochlorothiazide (HYDRODIURIL) 25 MG tablet Take 25 mg by mouth daily.   loratadine (CLARITIN) 10 MG tablet Take 10 mg by mouth daily.   losartan (COZAAR) 100 MG tablet Take 100 mg by mouth daily.   methocarbamol (ROBAXIN) 500 MG tablet Take 1 tablet (500 mg total) by mouth 2 (two) times daily.   oxyCODONE (ROXICODONE) 5 MG immediate release tablet Take 1 tablet (5 mg total) by mouth every 6 (six) hours as needed for severe pain.   Liraglutide -Weight Management  18 MG/3ML SOPN Inject 3 mLs into the skin daily. (Patient not taking: Reported on 02/15/2021)   [DISCONTINUED] DM-Doxylamine-Acetaminophen (NYQUIL HBP COLD & FLU) 15-6.25-325 MG/15ML LIQD Take 10 mLs by mouth every 8 (eight) hours as needed (rest or cold symptoms).   [DISCONTINUED] DM-Phenylephrine-Acetaminophen (VICKS DAYQUIL COLD & FLU) 10-5-325 MG/15ML LIQD Take 10 mLs by mouth every 6 (six) hours as needed (cold symptoms).   [DISCONTINUED] fluticasone furoate-vilanterol (BREO ELLIPTA) 200-25 MCG/INH AEPB Inhale 1 puff into the lungs daily.   [DISCONTINUED] lidocaine (LIDODERM) 5 % Place 1 patch onto the skin daily. Remove & Discard patch within 12 hours or as directed by MD   [DISCONTINUED] predniSONE (STERAPRED UNI-PAK 21 TAB) 10 MG (21) TBPK tablet Take by mouth daily. Take 6 tabs by mouth daily  for 2 days, then 5 tabs for 2 days, then 4 tabs for 2 days, then 3 tabs for 2 days, 2 tabs for 2 days, then 1 tab by mouth daily for 2 days   [DISCONTINUED] tiZANidine (ZANAFLEX) 4 MG tablet Take 1 tablet (4 mg total) by mouth every 6 (six) hours as needed for muscle spasms.   No facility-administered encounter medications on file as of 02/15/2021.     Review of Systems  Review of Systems  N/a Physical Exam  BP 132/84   Pulse 69   Ht '5\' 4"'$  (1.626 m)   Wt 279 lb 9.6 oz (126.8 kg)   SpO2 99% Comment: on RA  BMI 47.99 kg/m   Wt Readings from Last 5 Encounters:  02/15/21 279 lb 9.6 oz (126.8 kg)  12/13/20 250 lb (113.4 kg)  12/03/20 264 lb (119.7 kg)  11/20/20 265 lb (120.2 kg)  11/10/20 273 lb 2.4 oz (123.9 kg)    BMI Readings from Last 5 Encounters:  02/15/21 47.99 kg/m  12/13/20 44.29 kg/m  12/03/20 46.77 kg/m  11/20/20 46.94 kg/m  11/10/20 48.39 kg/m     Physical Exam General: Well-appearing, no acute distress Eyes: EOMI, no icterus Cardiovascular: RR, regular rhythm no murmur appreciated Pulmonary: Clear to auscultation bilaterally, no wheezes Abdomen: Nondistended,  bowel sounds present MSK: No synovitis, joint effusion Neuro: Normal gait, no weakness Psych: Normal mood, full affect  Assessment & Plan:   Dyspnea exertion: Likely multifactorial and concern to be related to poorly controlled asthma worse in the setting of recent COVID-19 infection.  PFTs 9/22 within normal limits.  Improved with ICS/LABA inhaler.  Asthma: Likely poorly controlled with COVID-19 infection.  Cough, dyspnea on exertion, chest tightness.  Symptoms markedly improved with ICS/LABA inhaler via air duo.  Consider phenotyping in the future.   Return in about 3 months (around 05/17/2021).   Lanier Clam, MD 02/15/2021

## 2021-02-15 NOTE — Progress Notes (Signed)
PFT done today. 

## 2021-02-15 NOTE — Patient Instructions (Signed)
Nice to see you  No changes to medications, continue inhaler as prescribed. If you need refills let us know.   Return to clinic in 6 months or sooner as needed

## 2021-02-19 ENCOUNTER — Telehealth: Payer: Self-pay | Admitting: Pulmonary Disease

## 2021-02-19 NOTE — Telephone Encounter (Signed)
We do not have any samples of Airduo Respiclick in[place].   Attempted to call pt but unable to reach and unable to leave VM. Will try to call back later.

## 2021-02-20 ENCOUNTER — Encounter: Payer: Self-pay | Admitting: *Deleted

## 2021-02-20 NOTE — Telephone Encounter (Signed)
Tried calling the pt again and there was no answer and her VM was full  Will close per protocol  Mailed letter

## 2021-07-23 ENCOUNTER — Emergency Department (HOSPITAL_BASED_OUTPATIENT_CLINIC_OR_DEPARTMENT_OTHER): Payer: PRIVATE HEALTH INSURANCE

## 2021-07-23 ENCOUNTER — Encounter (HOSPITAL_BASED_OUTPATIENT_CLINIC_OR_DEPARTMENT_OTHER): Payer: Self-pay

## 2021-07-23 ENCOUNTER — Other Ambulatory Visit: Payer: Self-pay

## 2021-07-23 ENCOUNTER — Emergency Department (HOSPITAL_BASED_OUTPATIENT_CLINIC_OR_DEPARTMENT_OTHER)
Admission: EM | Admit: 2021-07-23 | Discharge: 2021-07-23 | Disposition: A | Discharge status: Home / Self Care | Payer: PRIVATE HEALTH INSURANCE | Attending: Emergency Medicine | Admitting: Emergency Medicine

## 2021-07-23 DIAGNOSIS — W01198A Fall on same level from slipping, tripping and stumbling with subsequent striking against other object, initial encounter: Secondary | ICD-10-CM | POA: Diagnosis not present

## 2021-07-23 DIAGNOSIS — S0990XA Unspecified injury of head, initial encounter: Secondary | ICD-10-CM | POA: Diagnosis not present

## 2021-07-23 MED ORDER — HYDROCODONE-ACETAMINOPHEN 5-325 MG PO TABS
1.0000 | ORAL_TABLET | Freq: Once | ORAL | Status: AC
  Administered 2021-07-23: 1 via ORAL
  Filled 2021-07-23: qty 1

## 2021-07-23 NOTE — ED Provider Notes (Signed)
[facility] EMERGENCY DEPT Provider Note   CSN: [id number] Arrival date & time: 07/23/21  1658     History  Chief Complaint  Patient presents with   Head Injury    Annette Deleon is a 46 y.o. female here for evaluation of left head injury.  Few days ago bend over and hit the left side of her head.  Since then she has had left-sided headache as well as tenderness when she palpates left side of her head.  States her head feels "off."  She denies any syncope, vision changes, numbness or weakness.  No chest pain, shortness of breath.  Taking Tylenol at home without relief.  HPI     Home Medications Prior to Admission medications   Medication Sig Start Date End Date Taking? Authorizing Provider  acetaminophen (TYLENOL) 500 MG tablet Take 500-1,000 mg by mouth every 6 (six) hours as needed for mild pain.    [provider]  albuterol (PROVENTIL HFA;VENTOLIN HFA) 108 (90 Base) MCG/ACT inhaler Inhale 1 puff into the lungs every 6 (six) hours as needed for wheezing or shortness of breath.  11/19/16   [provider]  atorvastatin (LIPITOR) 40 MG tablet Take 1 tablet (40 mg total) by mouth daily at 6 PM. 06/13/18   Bonnielee Haff, MD  benzonatate (TESSALON) 200 MG capsule Take 200 mg by mouth 3 (three) times daily as needed for cough. 06/01/20   [provider]  Budesonide 90 MCG/ACT inhaler Inhale 1 puff into the lungs every 6 (six) hours as needed (wheezing or shortness of breath). 12/08/19   [provider]  carvedilol (COREG) 25 MG tablet Take 25 mg by mouth 2 (two) times daily. 06/24/19   [provider]  cloNIDine (CATAPRES) 0.2 MG tablet Take 0.1 mg by mouth daily as needed (if systolic Blood pressure is > 160). 06/30/19   [provider]  diclofenac Sodium (VOLTAREN) 1 % GEL Apply 4 g topically 4 (four) times daily. 12/13/20   Tedd Sias, PA  diltiazem (CARDIZEM CD) 120 MG 24 hr capsule Take 120 mg by mouth daily.  05/26/20   [provider]  fluticasone (FLONASE) 50 MCG/ACT nasal spray Place 2 sprays into both nostrils daily as needed for allergies. 06/01/20   [provider]  Fluticasone-Salmeterol (AIRDUO RESPICLICK 559/74) 163-84 MCG/ACT AEPB Inhale 1 puff into the lungs daily. 10/18/20   Hunsucker, Bonna Gains, MD  furosemide (LASIX) 20 MG tablet Take 20 mg by mouth daily. 09/21/19   [provider]  gabapentin (NEURONTIN) 300 MG capsule Take 300 mg by mouth at bedtime as needed (pain). 01/17/20   [provider]  guaiFENesin (MUCINEX) 600 MG 12 hr tablet Take 600 mg by mouth 2 (two) times daily as needed for cough or to loosen phlegm.    [provider]  hydrochlorothiazide (HYDRODIURIL) 25 MG tablet Take 25 mg by mouth daily.    [provider]  Liraglutide -Weight Management 18 MG/3ML SOPN Inject 3 mLs into the skin daily. Patient not taking: Reported on 02/15/2021    [provider]  loratadine (CLARITIN) 10 MG tablet Take 10 mg by mouth daily. 01/18/20   [provider]  losartan (COZAAR) 100 MG tablet Take 100 mg by mouth daily.    [provider]  methocarbamol (ROBAXIN) 500 MG tablet Take 1 tablet (500 mg total) by mouth 2 (two) times daily. 11/20/20   Alroy Bailiff, Margaux, PA-C  oxyCODONE (ROXICODONE) 5 MG immediate release tablet Take 1 tablet (  5 mg total) by mouth every 6 (six) hours as needed for severe pain. 12/13/20   Tedd Sias, PA      Allergies    Imitrex [sumatriptan base], Prednisone, Wellbutrin [bupropion], Yellow jacket venom [bee venom], Ibuprofen, and Penicillins    Review of Systems   Review of Systems  Constitutional: Negative.   HENT: Negative.    Respiratory: Negative.    Cardiovascular: Negative.   Gastrointestinal: Negative.   Genitourinary: Negative.   Musculoskeletal: Negative.   Skin: Negative.   Neurological:  Positive for headaches. Negative for dizziness, tremors, seizures, syncope,  facial asymmetry, speech difficulty, weakness, light-headedness and numbness.  All other systems reviewed and are negative.  Physical Exam Updated Vital Signs BP (!) 187/117 (BP Location: Right Arm)    Pulse (!) 109    Temp 97.7 F (36.5 C) (Tympanic)    Resp 16    Ht 5\' 3"  (1.6 m)    Wt 115.2 kg    LMP 07/12/2021 (Approximate)    SpO2 99%    BMI 44.99 kg/m  Physical Exam Physical Exam  Constitutional: Pt is oriented to person, place, and time. Pt appears well-developed and well-nourished. No distress.  HENT:  Head: Normocephalic and atraumatic.  Diffuse tenderness to left lateral head.  No obvious hematoma Mouth/Throat: Oropharynx is clear and moist.  Eyes: Conjunctivae and EOM are normal. Pupils are equal, round, and reactive to light. No scleral icterus.  No horizontal, vertical or rotational nystagmus  Neck: Normal range of motion. Neck supple.  Full active and passive ROM without pain No midline or paraspinal tenderness No nuchal rigidity or meningeal signs  Cardiovascular: Normal rate, regular rhythm and intact distal pulses.   Pulmonary/Chest: Effort normal and breath sounds normal. No respiratory distress. Pt has no wheezes. No rales.  Abdominal: Soft. Bowel sounds are normal. There is no tenderness. There is no rebound and no guarding.  Musculoskeletal: Normal range of motion.  Lymphadenopathy:    No cervical adenopathy.  Neurological: Pt. is alert and oriented to person, place, and time. He has normal reflexes. No cranial nerve deficit.  Exhibits normal muscle tone. Coordination normal.  Mental Status:  Alert, oriented, thought content appropriate. Speech fluent without evidence of aphasia. Able to follow 2 step commands without difficulty.  Cranial Nerves:  II:  Peripheral visual fields grossly normal, pupils equal, round, reactive to light III,IV, VI: ptosis not present, extra-ocular motions intact bilaterally  V,VII: smile symmetric, facial light touch sensation  equal VIII: hearing grossly normal bilaterally  IX,X: midline uvula rise  XI: bilateral shoulder shrug equal and strong XII: midline tongue extension  Motor:  5/5 in upper and lower extremities bilaterally including strong and equal grip strength and dorsiflexion/plantar flexion Sensory: Pinprick and light touch normal in all extremities.  Deep Tendon Reflexes: 2+ and symmetric  Cerebellar: normal finger-to-nose with bilateral upper extremities Gait: normal gait and balance CV: distal pulses palpable throughout   Skin: Skin is warm and dry. No rash noted. Pt is not diaphoretic.  Psychiatric: Pt has a normal mood and affect. Behavior is normal. Judgment and thought content normal.  Nursing note and vitals reviewed.  ED Results / Procedures / Treatments   Labs (all labs ordered are listed, but only abnormal results are displayed) Labs Reviewed - No data to display  EKG None  Radiology CT Head Wo Contrast  Result Date: 07/23/2021 CLINICAL DATA:  Head trauma, moderate-severe Lost balance leading to fall hitting head on left side. Struck corner of stereo.  Dizziness and nausea. EXAM: CT HEAD WITHOUT CONTRAST TECHNIQUE: Contiguous axial images were obtained from the base of the skull through the vertex without intravenous contrast. RADIATION DOSE REDUCTION: This exam was performed according to the departmental dose-optimization program which includes automated exposure control, adjustment of the mA and/or kV according to patient size and/or use of iterative reconstruction technique. COMPARISON:  Head CT 11/10/2020 FINDINGS: Brain: No intracranial hemorrhage, mass effect, or midline shift. No hydrocephalus. The basilar cisterns are patent. No evidence of territorial infarct or acute ischemia. No extra-axial or intracranial fluid collection. Vascular: No hyperdense vessel or unexpected calcification. Mild atherosclerosis of the carotid siphons Skull: No fracture or focal lesion. Sinuses/Orbits:  Scattered opacification of ethmoid air cells chronic. No acute findings. No mastoid effusion. No acute orbital findings. Other: No confluent scalp contusion. IMPRESSION: No acute intracranial abnormality. No skull fracture. Electronically Signed   By: Keith Rake M.D.   On: 07/23/2021 18:36    Procedures Procedures    Medications Ordered in ED Medications  HYDROcodone-acetaminophen (NORCO/VICODIN) 5-325 MG per tablet 1 tablet (has no administration in time range)    ED Course/ Medical Decision Making/ A&P     46 year old here for evaluation of persistent left-sided headache after head injury.  No syncope.  She has a nonfocal neuro exam without deficit.  She has no obvious head trauma on exam.  Does states she feels like she has "foggy brain."  No LOC, anticoagulation.  Imaging personally viewed and interpreted CT head no significant abnormality  Patient given medication for pain here.  She continues to have a nonfocal neuro exam without deficit.  Suspect some degree of postconcussive syndrome.  We will have her follow-up outpatient.  Of note she does have elevated blood pressure.  Did not take her medication today.  Does not want meds here.  We will have her follow-up with PCP for this.  Low suspicion for hypertensive urgency or emergency  The patient has been appropriately medically screened and/or stabilized in the ED. I have low suspicion for any other emergent medical condition which would require further screening, evaluation or treatment in the ED or require inpatient management.  Patient is hemodynamically stable and in no acute distress.  Patient able to ambulate in department prior to ED.  Evaluation does not show acute pathology that would require ongoing or additional emergent interventions while in the emergency department or further inpatient treatment.  I have discussed the diagnosis with the patient and answered all questions.  Pain is been managed while in the emergency  department and patient has no further complaints prior to discharge.  Patient is comfortable with plan discussed in room and is stable for discharge at this time.  I have discussed strict return precautions for returning to the emergency department.  Patient was encouraged to follow-up with PCP/specialist refer to at discharge.                           Medical Decision Making Amount and/or Complexity of Data Reviewed Radiology: ordered and independent interpretation performed. Decision-making details documented in ED Course.  Risk OTC drugs. Prescription drug management. Risk Details: Do not feel patient needs additional labs, imaging, hospitalization at this time         Final Clinical Impression(s) / ED Diagnoses Final diagnoses:  Injury of head, initial encounter    Rx / DC Orders ED Discharge Orders     None  Geetika Laborde A, PA-C 07/23/21 Valene Bors, MD 07/28/21 1513

## 2021-07-23 NOTE — Discharge Instructions (Signed)
You likely have postconcussive syndrome.  Should get better over time.  If not follow-up with your primary care provider  Return for new or worsening symptoms.

## 2021-07-23 NOTE — ED Triage Notes (Signed)
Saturday bent over and lost balance and fell over hitting head left side head.  Hit head on corner of stereo.  States did not pass out but dazed.  Denies blood thinners.  Since feeling dizzy and nausea.  States feels foggy

## 2021-09-21 ENCOUNTER — Other Ambulatory Visit: Payer: Self-pay

## 2021-09-21 ENCOUNTER — Encounter (HOSPITAL_BASED_OUTPATIENT_CLINIC_OR_DEPARTMENT_OTHER): Payer: Self-pay | Admitting: Pediatrics

## 2021-09-21 ENCOUNTER — Emergency Department (HOSPITAL_BASED_OUTPATIENT_CLINIC_OR_DEPARTMENT_OTHER): Payer: PRIVATE HEALTH INSURANCE

## 2021-09-21 ENCOUNTER — Emergency Department (HOSPITAL_BASED_OUTPATIENT_CLINIC_OR_DEPARTMENT_OTHER)
Admission: EM | Admit: 2021-09-21 | Discharge: 2021-09-21 | Disposition: A | Discharge status: Home / Self Care | Payer: PRIVATE HEALTH INSURANCE | Attending: Emergency Medicine | Admitting: Emergency Medicine

## 2021-09-21 DIAGNOSIS — M25512 Pain in left shoulder: Secondary | ICD-10-CM | POA: Insufficient documentation

## 2021-09-21 DIAGNOSIS — S6991XA Unspecified injury of right wrist, hand and finger(s), initial encounter: Secondary | ICD-10-CM | POA: Insufficient documentation

## 2021-09-21 DIAGNOSIS — M25561 Pain in right knee: Secondary | ICD-10-CM | POA: Insufficient documentation

## 2021-09-21 DIAGNOSIS — W1839XA Other fall on same level, initial encounter: Secondary | ICD-10-CM | POA: Insufficient documentation

## 2021-09-21 MED ORDER — ACETAMINOPHEN 500 MG PO TABS
1000.0000 mg | ORAL_TABLET | Freq: Once | ORAL | Status: AC
  Administered 2021-09-21: 1000 mg via ORAL
  Filled 2021-09-21: qty 2

## 2021-09-21 MED ORDER — OXYCODONE HCL 5 MG PO TABS
5.0000 mg | ORAL_TABLET | Freq: Once | ORAL | Status: AC
  Administered 2021-09-21: 5 mg via ORAL
  Filled 2021-09-21: qty 1

## 2021-09-21 MED ORDER — OXYCODONE HCL 5 MG PO TABS: 5.0000 mg | ORAL_TABLET | Freq: Four times a day (QID) | ORAL | 0 refills | Status: DC | PRN

## 2021-09-21 NOTE — ED Provider Notes (Signed)
?[facility] ?Provider Note ? ? ?CSN: [id number] ?Arrival date & time: 09/21/21  1134 ? ?  ? ?History ? ?Chief Complaint  ?Patient presents with  ? Fall  ? ? ?Annette Deleon is a 46 y.o. female. ? ?Patient presents today for evaluation of pain after a fall which occurred at 7:20 AM today.  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  Her knee was hurting while she was working overnight.  While she was leaving this morning, the knee pain was worse causing her leg to buckle.  She fell onto her right knee and onto an outstretched right hand.  She did not hit her head or lose consciousness.  She has pain in her left shoulder as well from where she had a bag hanging.  No treatments prior to arrival.  No numbness or tingling.  Pain is worse with movement.  Pain is worst in the right knee and the right wrist. ? ? ?  ? ?Home Medications ?Prior to Admission medications   ?Medication Sig Start Date End Date Taking? Authorizing Provider  ?acetaminophen (TYLENOL) 500 MG tablet Take 500-1,000 mg by mouth every 6 (six) hours as needed for mild pain.    [provider]  ?albuterol (PROVENTIL HFA;VENTOLIN HFA) 108 (90 Base) MCG/ACT inhaler Inhale 1 puff into the lungs every 6 (six) hours as needed for wheezing or shortness of breath.  11/19/16   [provider]  ?atorvastatin (LIPITOR) 40 MG tablet Take 1 tablet (40 mg total) by mouth daily at 6 PM. 06/13/18   Bonnielee Haff, MD  ?benzonatate (TESSALON) 200 MG capsule Take 200 mg by mouth 3 (three) times daily as needed for cough. 06/01/20   [provider]  ?Budesonide 90 MCG/ACT inhaler Inhale 1 puff into the lungs every 6 (six) hours as needed (wheezing or shortness of breath). 12/08/19   [provider]  ?carvedilol (COREG) 25 MG tablet Take 25 mg by mouth 2 (two) times daily. 06/24/19   [provider]  ?cloNIDine  (CATAPRES) 0.2 MG tablet Take 0.1 mg by mouth daily as needed (if systolic Blood pressure is > 160). 06/30/19   [provider]  ?diclofenac Sodium (VOLTAREN) 1 % GEL Apply 4 g topically 4 (four) times daily. 12/13/20   Tedd Sias, PA  ?diltiazem (CARDIZEM CD) 120 MG 24 hr capsule Take 120 mg by mouth daily. 05/26/20   [provider]  ?fluticasone (FLONASE) 50 MCG/ACT nasal spray Place 2 sprays into both nostrils daily as needed for allergies. 06/01/20   [provider]  ?Fluticasone-Salmeterol (AIRDUO RESPICLICK 163/84) 536-46 MCG/ACT AEPB Inhale 1 puff into the lungs daily. 10/18/20   Hunsucker, Bonna Gains, MD  ?furosemide (LASIX) 20 MG tablet Take 20 mg by mouth daily. 09/21/19   [provider]  ?gabapentin (NEURONTIN) 300 MG capsule Take 300 mg by mouth at bedtime as needed (pain). 01/17/20   [provider]  ?guaiFENesin (MUCINEX) 600 MG 12 hr tablet Take 600 mg by mouth 2 (two) times daily as needed for cough or to loosen phlegm.    [provider]  ?hydrochlorothiazide (HYDRODIURIL) 25 MG tablet Take 25 mg by mouth daily.    [provider]  ?Liraglutide -Weight Management 18 MG/3ML SOPN Inject 3 mLs into the skin daily. ?Patient not taking: Reported on 02/15/2021    [provider]  ?loratadine (  CLARITIN) 10 MG tablet Take 10 mg by mouth daily. 01/18/20   [provider]  ?losartan (COZAAR) 100 MG tablet Take 100 mg by mouth daily.    [provider]  ?methocarbamol (ROBAXIN) 500 MG tablet Take 1 tablet (500 mg total) by mouth 2 (two) times daily. 11/20/20   Eustaquio Maize, PA-C  ?oxyCODONE (ROXICODONE) 5 MG immediate release tablet Take 1 tablet (5 mg total) by mouth every 6 (six) hours as needed for severe pain. 12/13/20   Tedd Sias, PA  ?   ? ?Allergies    ?Imitrex [sumatriptan base], Prednisone, Wellbutrin [bupropion], Yellow jacket venom [bee venom], Ibuprofen, and Penicillins   ? ?Review of Systems   ?Review  of Systems ? ?Physical Exam ?Updated Vital Signs ?BP (!) 181/114 (BP Location: Left Arm)   Pulse (!) 102   Temp 98.2 ?F (36.8 ?C)   Resp 18   Ht '5\' 3"'$  (1.6 m)   Wt 112.5 kg   SpO2 100%   BMI 43.93 kg/m?  ? ?Physical Exam ?Vitals and nursing note reviewed.  ?Constitutional:   ?   Appearance: She is well-developed.  ?HENT:  ?   Head: Normocephalic and atraumatic.  ?Eyes:  ?   Pupils: Pupils are equal, round, and reactive to light.  ?Cardiovascular:  ?   Pulses: Normal pulses. No decreased pulses.  ?Musculoskeletal:     ?   General: Tenderness present.  ?   Right shoulder: No tenderness or bony tenderness. Normal range of motion.  ?   Left shoulder: Tenderness present. No bony tenderness. Normal range of motion.  ?   Right elbow: Normal range of motion. No tenderness.  ?   Left elbow: Normal range of motion. No tenderness.  ?   Right wrist: Tenderness and bony tenderness present. No deformity. Decreased range of motion.  ?   Left wrist: No tenderness. Normal range of motion.  ?   Cervical back: Normal range of motion and neck supple. No tenderness. Normal range of motion.  ?   Right hip: No tenderness. Normal range of motion.  ?   Right knee: Decreased range of motion. Tenderness present.  ?   Left knee: No tenderness.  ?   Right ankle: No tenderness. Normal range of motion.  ?Skin: ?   General: Skin is warm and dry.  ?Neurological:  ?   Mental Status: She is alert.  ?   Sensory: No sensory deficit.  ?   Comments: Motor, sensation, and vascular distal to the injury is fully intact.   ?Psychiatric:     ?   Mood and Affect: Mood normal.  ? ? ?ED Results / Procedures / Treatments   ?Labs ?(all labs ordered are listed, but only abnormal results are displayed) ?Labs Reviewed - No data to display ? ?EKG ?None ? ?Radiology ?DG Wrist Complete Right ? ?Result Date: 09/21/2021 ?CLINICAL DATA:  Fall.  Right wrist pain at the first MCP. EXAM: RIGHT WRIST - COMPLETE 3+ VIEW COMPARISON:  Right wrist radiograph 10/12/2016  FINDINGS: Small ossification at the dorsal aspect of the triquetrum, seen only on lateral view. No other evidence of acute fracture. No dislocation. No significant arthropathy or suspicious focal osseous lesion. Soft tissues are unremarkable. IMPRESSION: Small ossification at the dorsal aspect of the triquetrum may represent a triquetral fracture. Recommend clinical correlation for point tenderness. CT of the wrist could be performed for further evaluation. Electronically Signed   By: Ileana Roup M.D.   On: 09/21/2021 13:05  ? ?  DG Knee Complete 4 Views Right ? ?Result Date: 09/21/2021 ?CLINICAL DATA:  fall, knee pain, acute on chronic EXAM: RIGHT KNEE - COMPLETE 4+ VIEW COMPARISON:  11/20/2020 FINDINGS: No acute fracture or malalignment. Tricompartmental osteoarthritis, moderate within the medial compartment. Findings have slightly progressed. Trace joint effusion with multiple loose bodies in the suprapatellar pouch. No focal soft tissue swelling. IMPRESSION: 1. No acute fracture or malalignment. 2. Tricompartmental osteoarthritis, moderate within the medial compartment. Electronically Signed   By: Davina Poke D.O.   On: 09/21/2021 12:54   ? ?Procedures ?Procedures  ? ? ?Medications Ordered in ED ?Medications  ?acetaminophen (TYLENOL) tablet 1,000 mg (1,000 mg Oral Given 09/21/21 1312)  ?oxyCODONE (Oxy IR/ROXICODONE) immediate release tablet 5 mg (5 mg Oral Given 09/21/21 1354)  ? ? ?ED Course/ Medical Decision Making/ A&P ?  ? ?Patient seen and examined. History obtained directly from patient.  ? ?Labs/EKG: None ordered ? ?Imaging: X-ray of the right wrist and right knee ? ?Medications/Fluids: Tylenol 1000 mg. ? ?Most recent vital signs reviewed and are as follows: ?BP (!) 181/114 (BP Location: Left Arm)   Pulse (!) 102   Temp 98.2 ?F (36.8 ?C)   Resp 18   Ht '5\' 3"'$  (1.6 m)   Wt 112.5 kg   SpO2 100%   BMI 43.93 kg/m?  ? ?Initial impression: Right wrist and knee pain, fall.  Also left shoulder pain however  this is minor. ? ?2:27 PM Reassessment performed. Patient appears comfortable. ? ?Labs personally reviewed and interpreted including: None ? ?Imaging personally visualized and interpreted including: X-ray of the wrist and

## 2021-09-21 NOTE — Discharge Instructions (Addendum)
Please read and follow all provided instructions. ? ?Your diagnoses today include:  ?1. Injury of right wrist, initial encounter   ?2. Acute pain of right knee   ? ? ?Tests performed today include: ?An x-ray of the affected wrist: Shows potential triquetrum fracture  ?X-ray of the knee: Shows arthritis, no fractures  ?Vital signs. See below for your results today.  ? ?Medications prescribed:  ?None ? ?Take any prescribed medications only as directed. ? ?Home care instructions:  ?Follow any educational materials contained in this packet ?Follow R.I.C.E. Protocol: ?R - rest your injury ? I  - use ice on injury without applying directly to skin ?C - compress injury with bandage or splint ?E - elevate the injury as much as possible ? ?Follow-up instructions: ?Please follow-up with your orthopedist or the orthopedic hand physician referral listed in 1 week. ? ?Return instructions:  ?Please return if your fingers are numb or tingling, appear gray or blue, or you have severe pain (also elevate the arm and loosen splint or wrap if you were given one) ?Please return to the Emergency Department if you experience worsening symptoms.  ?Please return if you have any other emergent concerns. ? ?Additional Information: ? ?Your vital signs today were: ?BP (!) 185/122 (BP Location: Left Wrist)   Pulse 88   Temp 98.2 ?F (36.8 ?C)   Resp 20   Ht '5\' 3"'$  (1.6 m)   Wt 112.5 kg   SpO2 99%   BMI 43.93 kg/m?  ?If your blood pressure (BP) was elevated above 135/85 this visit, please have this repeated by your doctor within one month. ?-------------- ? ? ?

## 2021-09-21 NOTE — ED Notes (Signed)
Discharge instructions, follow up care, and prescriptions reviewed and explained, pt verbalized understanding.  

## 2021-09-21 NOTE — ED Triage Notes (Signed)
Reported fall earlier today d/t knee weakness and landed on right wrist; denies LOC; and head injury; c/o right wrist, left shoulder and bilateral knee pain ?

## 2022-01-25 ENCOUNTER — Emergency Department (HOSPITAL_COMMUNITY)
Admission: EM | Admit: 2022-01-25 | Discharge: 2022-01-25 | Discharge status: Against Medical Advice | Payer: PRIVATE HEALTH INSURANCE

## 2022-02-08 IMAGING — CR DG KNEE COMPLETE 4+V*L*
4 series · 4 of 4 positions shown · non-contrast
Comparison: None.

CLINICAL DATA: Status post fall, pain

EXAM:
LEFT KNEE - COMPLETE 4+ VIEW

[t knee ap left]
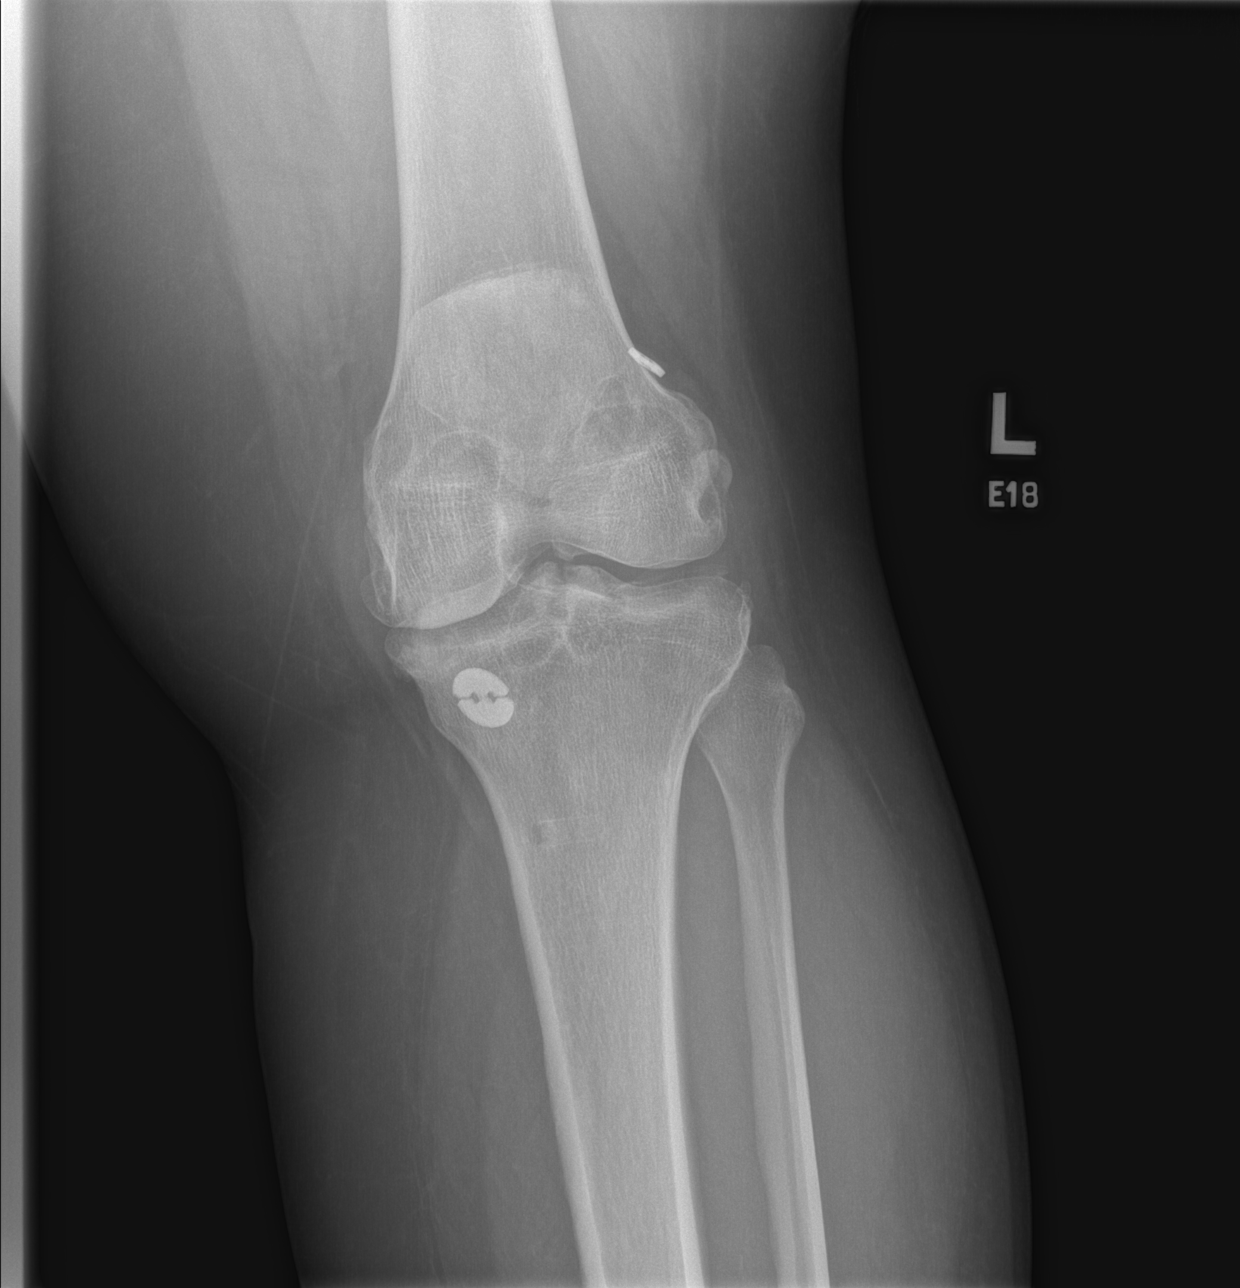

[t knee obl left (1 of 2)]
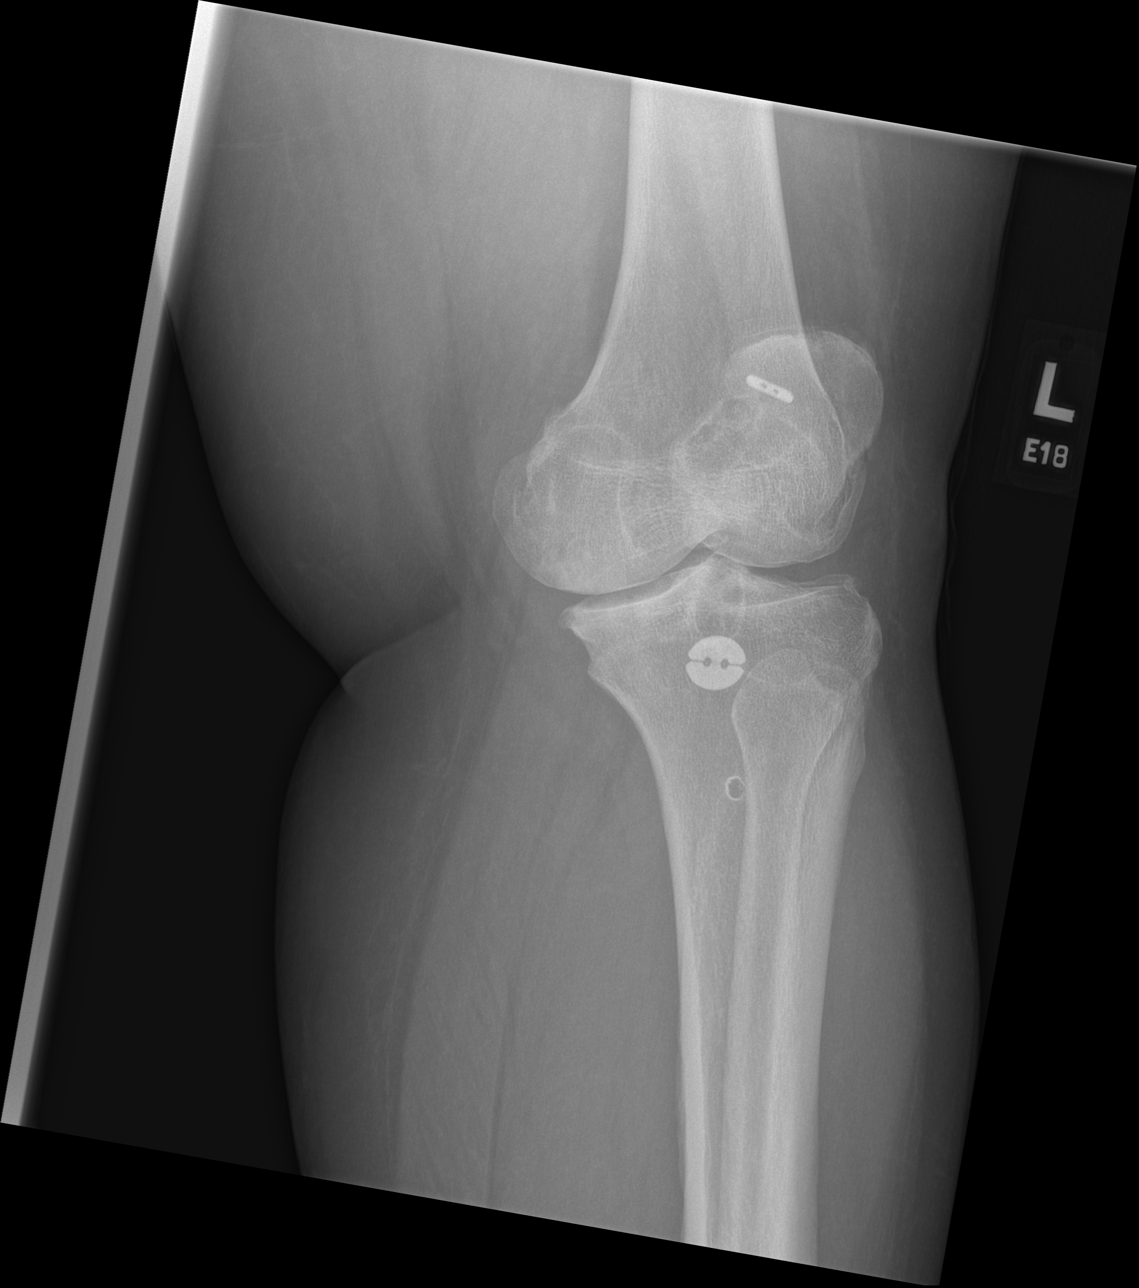

[t knee obl left (2 of 2)]
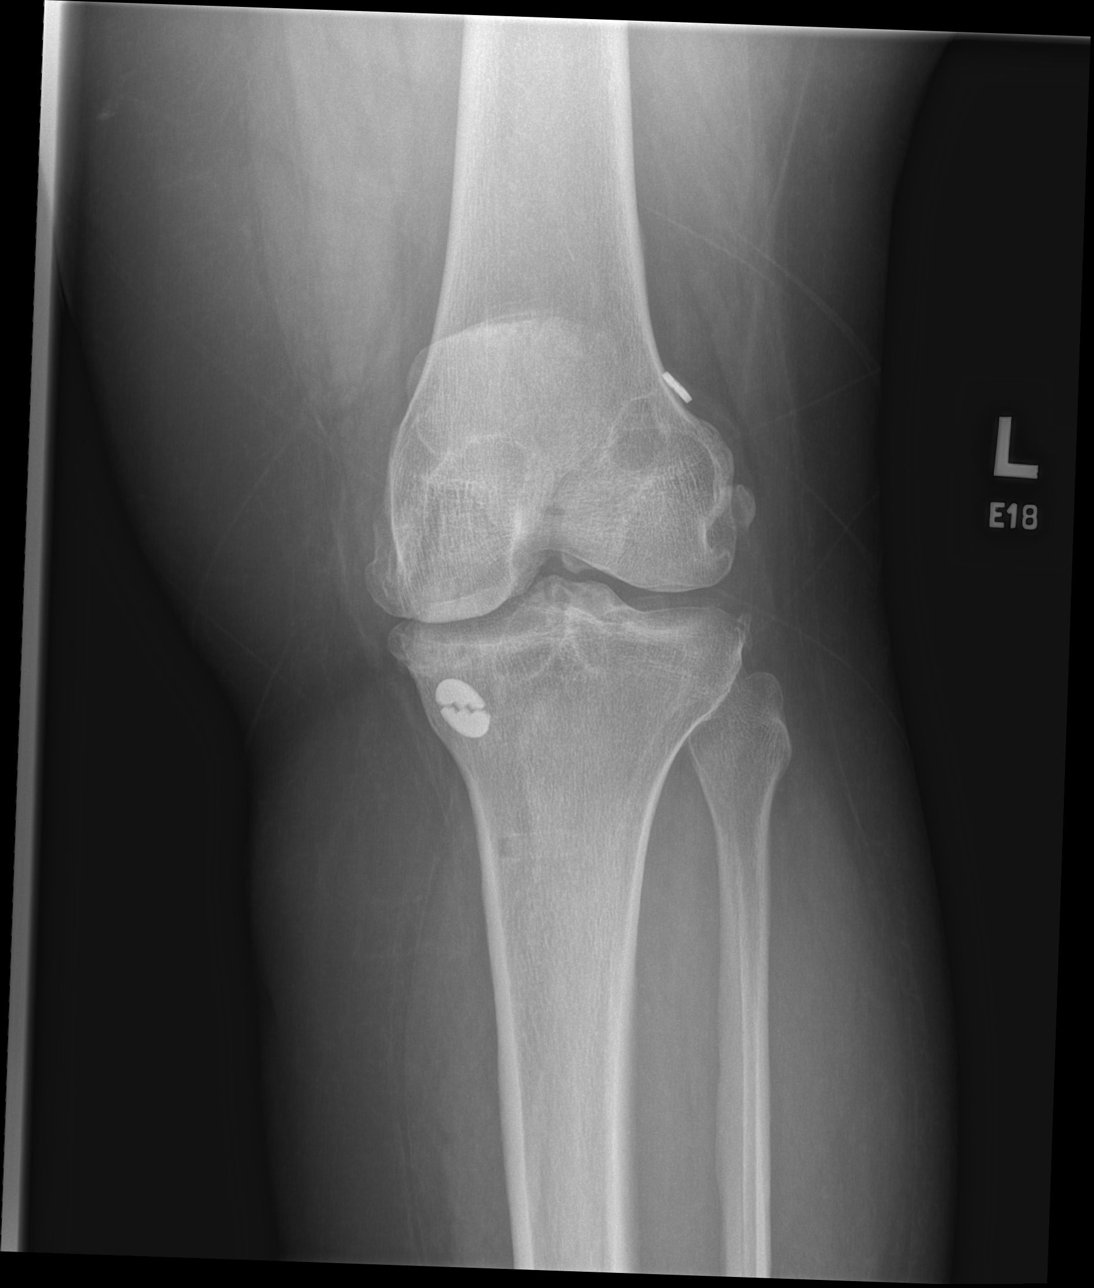

[x knee lat left]
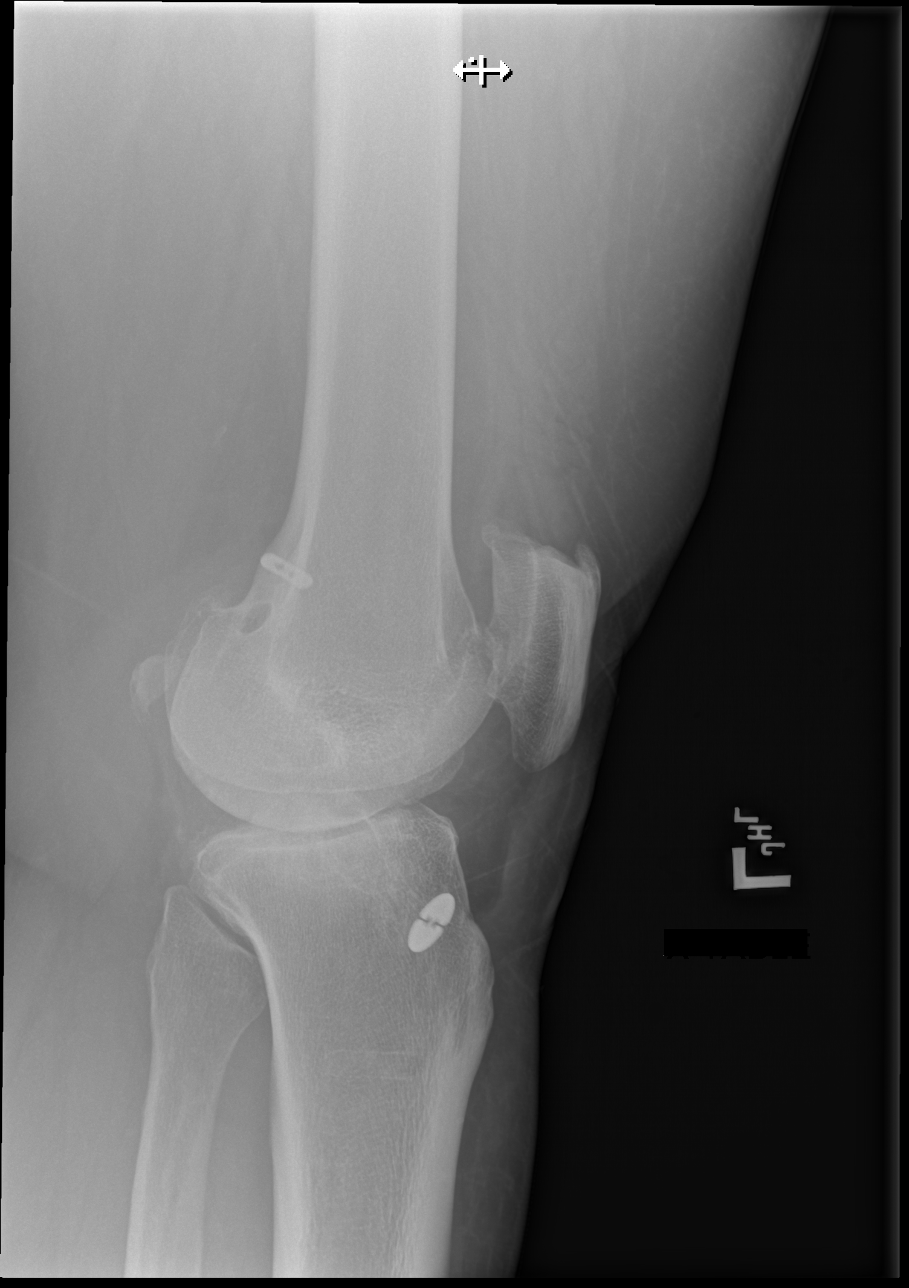

[4 of 4 positions shown; findings below may reference images not displayed]

FINDINGS: No acute fracture or dislocation. No aggressive osseous lesion.
Normal alignment. Prior ACL repair. Mild-moderate osteoarthritis of
the medial femorotibial compartment. Mild osteoarthritis of the
lateral femorotibial compartment. Mild osteoarthritis of the
patellofemoral compartment. No significant joint effusion.

Soft tissue are unremarkable. No radiopaque foreign body or soft
tissue emphysema.
IMPRESSION: No acute osseous injury of the left knee.

## 2022-02-08 IMAGING — CR DG ELBOW COMPLETE 3+V*L*
4 series · 4 of 4 positions shown · non-contrast
Comparison: None.

CLINICAL DATA: Status post fall.  Low back pain.  Elbow pain.

EXAM:
LEFT ELBOW - COMPLETE 3+ VIEW

[x elbow ap left]
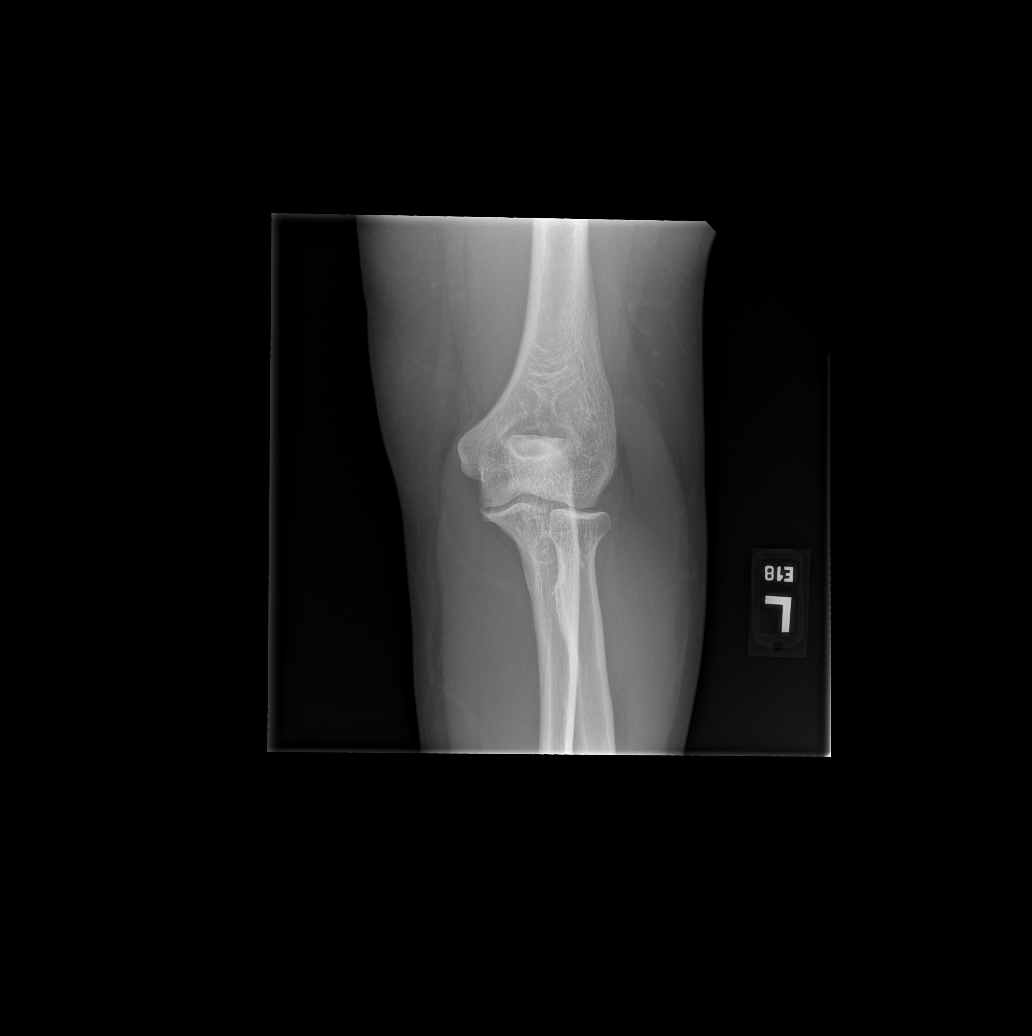

[x elbow obl left (1 of 2)]
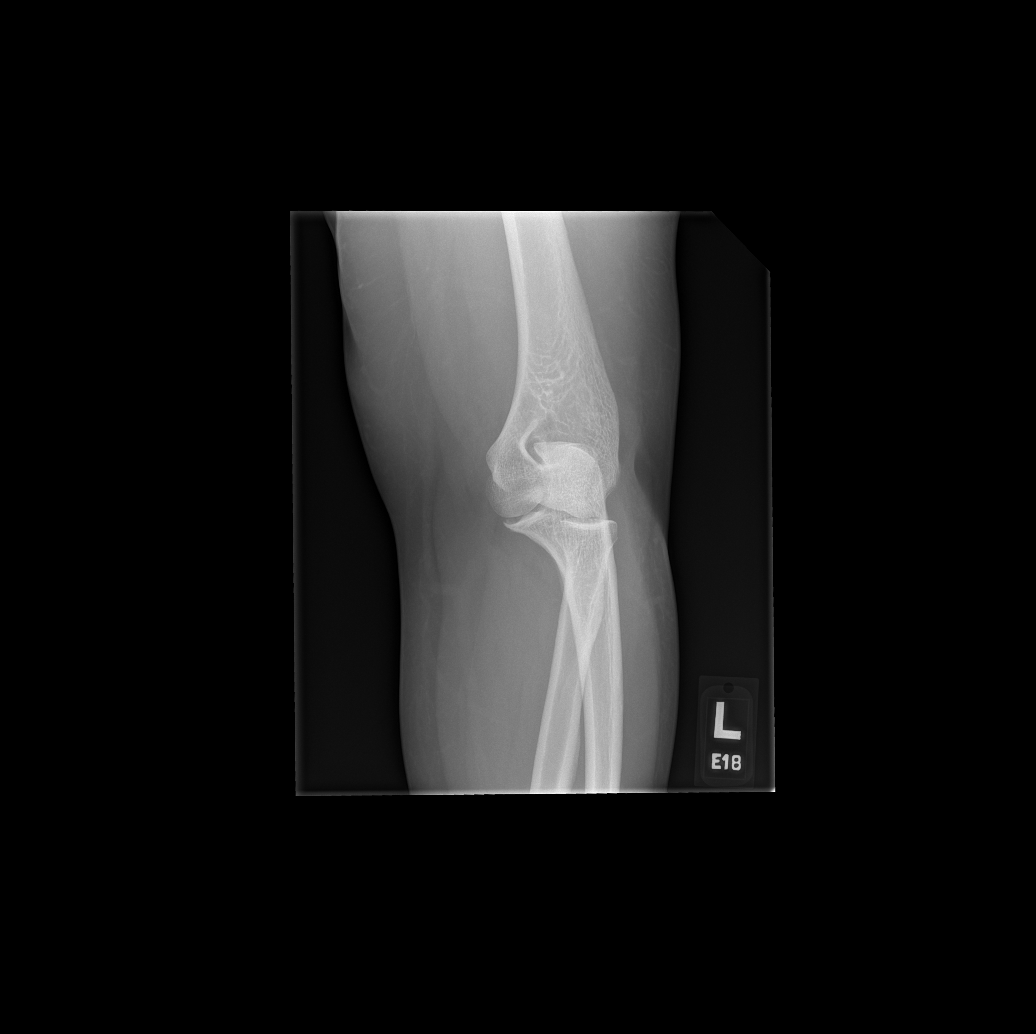

[x elbow obl left (2 of 2)]
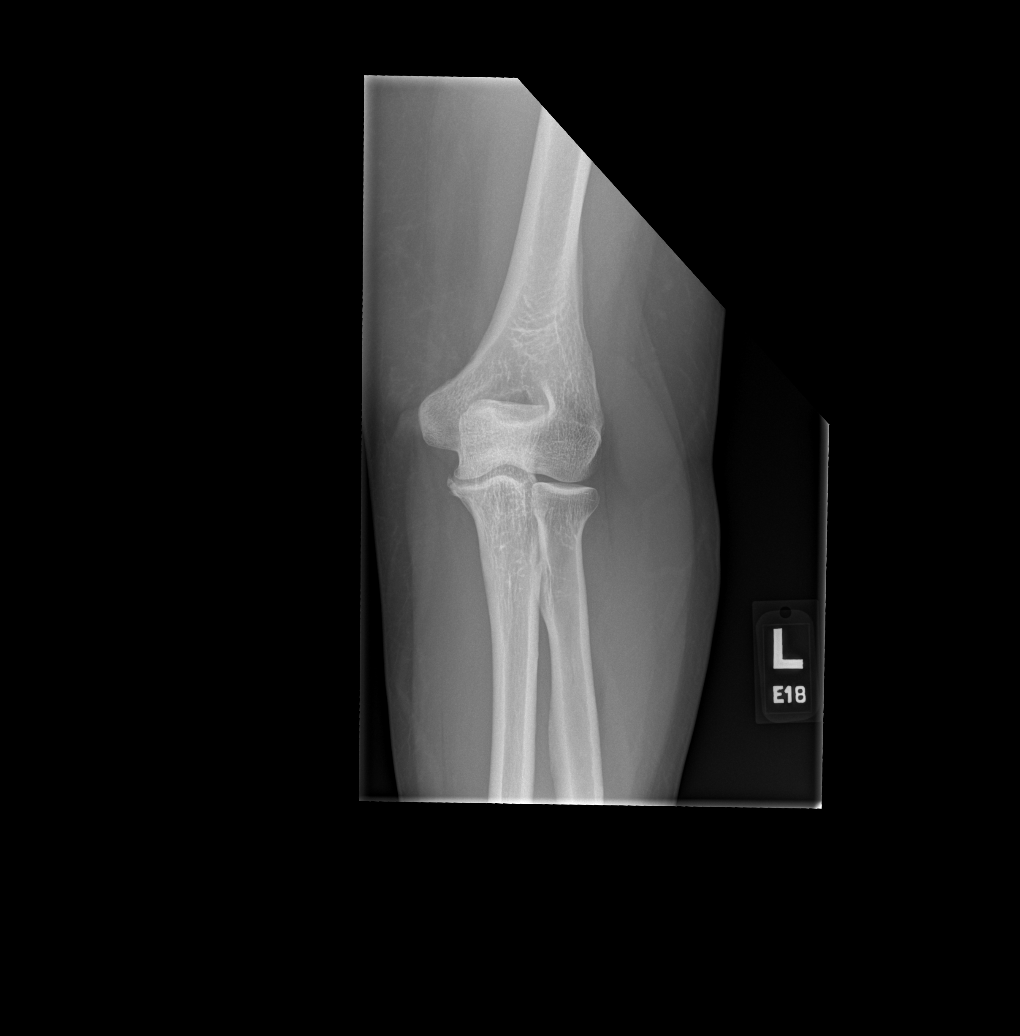

[x elbow lat left]
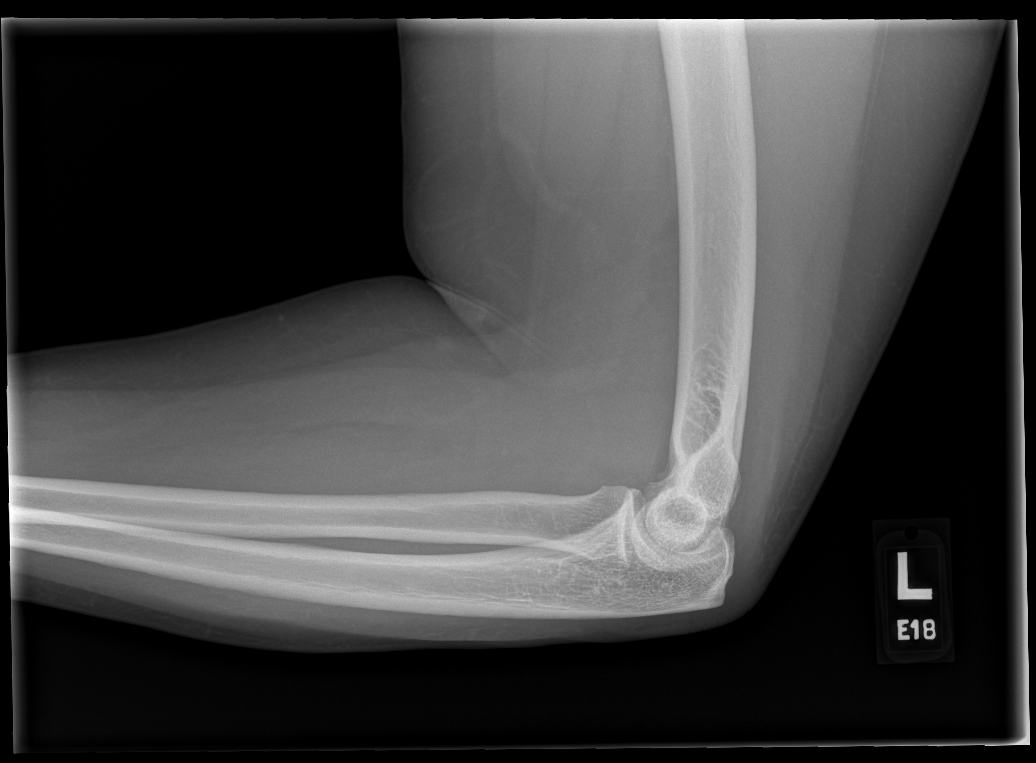

[4 of 4 positions shown; findings below may reference images not displayed]

FINDINGS: No acute fracture or dislocation. No aggressive osseous lesion.
Normal alignment. Tiny peripheral marginal osteophytes of the
proximal ulna.

Soft tissue are unremarkable. No radiopaque foreign body or soft
tissue emphysema.
IMPRESSION: No acute osseous injury of the left elbow.

## 2022-03-28 ENCOUNTER — Emergency Department (HOSPITAL_BASED_OUTPATIENT_CLINIC_OR_DEPARTMENT_OTHER)
Admission: EM | Admit: 2022-03-28 | Discharge: 2022-03-29 | Discharge status: Against Medical Advice | Payer: 59 | Attending: Emergency Medicine | Admitting: Emergency Medicine

## 2022-03-28 ENCOUNTER — Ambulatory Visit (INDEPENDENT_AMBULATORY_CARE_PROVIDER_SITE_OTHER)
Admission: EM | Admit: 2022-03-28 | Discharge: 2022-03-28 | Disposition: A | Discharge status: Home / Self Care | Payer: 59 | Source: Home / Self Care

## 2022-03-28 ENCOUNTER — Other Ambulatory Visit: Payer: Self-pay

## 2022-03-28 DIAGNOSIS — Z5321 Procedure and treatment not carried out due to patient leaving prior to being seen by health care provider: Secondary | ICD-10-CM | POA: Diagnosis not present

## 2022-03-28 DIAGNOSIS — J0141 Acute recurrent pansinusitis: Secondary | ICD-10-CM | POA: Insufficient documentation

## 2022-03-28 DIAGNOSIS — R0982 Postnasal drip: Secondary | ICD-10-CM | POA: Insufficient documentation

## 2022-03-28 DIAGNOSIS — R110 Nausea: Secondary | ICD-10-CM | POA: Insufficient documentation

## 2022-03-28 DIAGNOSIS — H66002 Acute suppurative otitis media without spontaneous rupture of ear drum, left ear: Secondary | ICD-10-CM | POA: Insufficient documentation

## 2022-03-28 DIAGNOSIS — J329 Chronic sinusitis, unspecified: Secondary | ICD-10-CM | POA: Insufficient documentation

## 2022-03-28 DIAGNOSIS — R059 Cough, unspecified: Secondary | ICD-10-CM | POA: Insufficient documentation

## 2022-03-28 DIAGNOSIS — Z9189 Other specified personal risk factors, not elsewhere classified: Secondary | ICD-10-CM | POA: Insufficient documentation

## 2022-03-28 DIAGNOSIS — R0981 Nasal congestion: Secondary | ICD-10-CM | POA: Insufficient documentation

## 2022-03-28 DIAGNOSIS — R519 Headache, unspecified: Secondary | ICD-10-CM | POA: Insufficient documentation

## 2022-03-28 DIAGNOSIS — Z20822 Contact with and (suspected) exposure to covid-19: Secondary | ICD-10-CM | POA: Insufficient documentation

## 2022-03-28 DIAGNOSIS — Z1152 Encounter for screening for COVID-19: Secondary | ICD-10-CM

## 2022-03-28 MED ORDER — ONDANSETRON 4 MG PO TBDP
4.0000 mg | ORAL_TABLET | Freq: Once | ORAL | Status: AC | PRN
  Administered 2022-03-28: 4 mg via ORAL
  Filled 2022-03-28: qty 1

## 2022-03-28 MED ORDER — FLUTICASONE PROPIONATE 50 MCG/ACT NA SUSP: 1.0000 | Freq: Every day | NASAL | 1 refills | Status: AC

## 2022-03-28 MED ORDER — CEFDINIR 300 MG PO CAPS: 300.0000 mg | ORAL_CAPSULE | Freq: Two times a day (BID) | ORAL | 0 refills | Status: AC

## 2022-03-28 MED ORDER — CETIRIZINE HCL 10 MG PO TABS: 10.0000 mg | ORAL_TABLET | Freq: Every day | ORAL | 1 refills | Status: AC

## 2022-03-28 MED ORDER — IPRATROPIUM BROMIDE 0.06 % NA SOLN: 2.0000 | Freq: Three times a day (TID) | NASAL | 1 refills | Status: AC

## 2022-03-28 NOTE — ED Provider Notes (Signed)
[facility]    CSN: [id number] Arrival date & time: 03/28/22  1505    HISTORY   Chief Complaint  Patient presents with   Cough   Headache   Nasal Congestion   HPI Annette Deleon is a pleasant, 46 y.o. female who presents to urgent care today. Pt c/o headache, cough productive of yellow/green sputum, sinus pressure, post nasal drip and congestion x 3 days. 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  Patient states she has been taking Tylenol without meaningful relief of her symptoms.    The history is provided by the patient.  Cough Cough characteristics:  Productive Sputum characteristics:  Green and yellow Severity:  Mild Onset quality:  Sudden Timing:  Intermittent Progression:  Unchanged Chronicity:  Recurrent Smoker: no   Context: exposure to allergens, sick contacts and upper respiratory infection   Relieved by:  Nothing Worsened by:  Nothing Associated symptoms: ear fullness, rhinorrhea and sinus congestion   Associated symptoms: no chest pain, no chills, no diaphoresis, no ear pain, no eye discharge, no fever, no myalgias, no rash, no shortness of breath, no sore throat and no wheezing   Associated symptoms comment:  Sinus pressure Risk factors: recent infection   Risk factors comment:  Allergies Headache Pain location:  Frontal Quality:  Dull Radiates to:  Eyes Timing:  Constant Progression:  Worsening Associated symptoms: congestion, cough, drainage, eye pain, facial pain, sinus pressure and URI   Associated symptoms: no abdominal pain, no blurred vision, no dizziness, no ear pain, no fatigue, no fever, no focal weakness, no hearing loss, no loss of balance, no myalgias, no nausea, no neck stiffness, no photophobia, no sore throat, no syncope and no weakness    Past Medical History:  Diagnosis Date   Hypertension    Migraines    Obesity    Pseudotumor cerebri    Sleep apnea    Patient Active Problem List   Diagnosis Date  Noted   COVID-19 virus infection 67/61/9509   Acute diastolic CHF (congestive heart failure) ([place]) 07/03/2020   Chest pain, rule out acute myocardial infarction 06/13/2018   Aortic atherosclerosis ([place])    Pure hypercholesterolemia    Near syncope 32/67/1245   Complicated migraine 80/99/8338   HTN (hypertension) 11/20/2011   Pseudotumor cerebri 11/20/2011   Obesity 11/20/2011   Past Surgical History:  Procedure Laterality Date   ARTHROSCOPIC REPAIR ACL     CESAREAN SECTION     CHOLECYSTECTOMY     KNEE SURGERY     right   MYOMECTOMY     OB History   No obstetric history on file.    Home Medications    Prior to Admission medications   Medication Sig Start Date End Date Taking? Authorizing Provider  diltiazem (CARDIZEM CD) 360 MG 24 hr capsule Take 360 mg by mouth daily. 02/20/22  Yes [provider]  acetaminophen (TYLENOL) 500 MG tablet Take 500-1,000 mg by mouth every 6 (six) hours as needed for mild pain.    [provider]  albuterol (PROVENTIL HFA;VENTOLIN HFA) 108 (90 Base) MCG/ACT inhaler Inhale 1 puff into the lungs every 6 (six) hours as needed for wheezing or shortness of breath.  11/19/16   [provider]  atorvastatin (LIPITOR) 40 MG tablet Take 1 tablet (40 mg total) by mouth daily at 6 PM. 06/13/18   Bonnielee Haff, MD  Budesonide 90 MCG/ACT inhaler Inhale 1 puff into the lungs every 6 (six) hours as needed (wheezing  or shortness of breath). 12/08/19   [provider]  cloNIDine (CATAPRES) 0.2 MG tablet Take 0.1 mg by mouth daily as needed (if systolic Blood pressure is > 160). 06/30/19   [provider]  fluticasone (FLONASE) 50 MCG/ACT nasal spray Place 2 sprays into both nostrils daily as needed for allergies. 06/01/20   [provider]  Fluticasone-Salmeterol (AIRDUO RESPICLICK 542/70) 623-76 MCG/ACT AEPB Inhale 1 puff into the lungs daily. 10/18/20   Hunsucker, Bonna Gains, MD  furosemide (LASIX) 20 MG tablet Take 20  mg by mouth daily. 09/21/19   [provider]  gabapentin (NEURONTIN) 300 MG capsule Take 300 mg by mouth at bedtime as needed (pain). 01/17/20   [provider]  guaiFENesin (MUCINEX) 600 MG 12 hr tablet Take 600 mg by mouth 2 (two) times daily as needed for cough or to loosen phlegm.    [provider]  hydrochlorothiazide (HYDRODIURIL) 25 MG tablet Take 25 mg by mouth daily.    [provider]  loratadine (CLARITIN) 10 MG tablet Take 10 mg by mouth daily. 01/18/20   [provider]  losartan (COZAAR) 100 MG tablet Take 100 mg by mouth daily.    [provider]    Family History Family History  Problem Relation Age of Onset   Heart attack Father 1   Social History Social History   Tobacco Use   Smoking status: Never   Smokeless tobacco: Never  Vaping Use   Vaping Use: Never used  Substance Use Topics   Alcohol use: Yes    Comment: occasionally   Drug use: No   Allergies   Imitrex [sumatriptan base], Prednisone, Wellbutrin [bupropion], Yellow jacket venom [bee venom], Ibuprofen, and Penicillins  Review of Systems Review of Systems  Constitutional:  Negative for chills, diaphoresis, fatigue and fever.  HENT:  Positive for congestion, postnasal drip, rhinorrhea and sinus pressure. Negative for ear pain, hearing loss and sore throat.   Eyes:  Positive for pain. Negative for blurred vision, photophobia and discharge.  Respiratory:  Positive for cough. Negative for shortness of breath and wheezing.   Cardiovascular:  Negative for chest pain and syncope.  Gastrointestinal:  Negative for abdominal pain and nausea.  Musculoskeletal:  Negative for myalgias and neck stiffness.  Skin:  Negative for rash.  Neurological:  Negative for dizziness, focal weakness, weakness and loss of balance.   Pertinent findings revealed after performing a 14 point review of systems has been noted in the history of present illness.  Physical  Exam Triage Vital Signs ED Triage Vitals  Enc Vitals Group     BP 03/30/21 0827 (!) 147/82     Pulse Rate 03/30/21 0827 72     Resp 03/30/21 0827 18     Temp 03/30/21 0827 98.3 F (36.8 C)     Temp Source 03/30/21 0827 Oral     SpO2 03/30/21 0827 98 %     Weight --      Height --      Head Circumference --      Peak Flow --      Pain Score 03/30/21 0826 5     Pain Loc --      Pain Edu? --      Excl. in [place]? --   No data found.  Updated Vital Signs BP (!) 162/103 (BP Location: Left Arm)   Pulse (!) 112   Temp 98.4 F (36.9 C) (Oral)   Resp 18   LMP 03/12/2022  SpO2 98%   Physical Exam Vitals and nursing note reviewed.  Constitutional:      General: She is awake. She is not in acute distress.    Appearance: Normal appearance. She is well-developed. She is obese. She is ill-appearing. She is not toxic-appearing.  HENT:     Head: Normocephalic and atraumatic.     Salivary Glands: Right salivary gland is not diffusely enlarged or tender. Left salivary gland is not diffusely enlarged or tender.     Right Ear: Ear canal and external ear normal. No drainage. There is no impacted cerumen. Tympanic membrane is bulging. Tympanic membrane is not injected or erythematous.     Left Ear: Ear canal and external ear normal. No drainage. A middle ear effusion is present. There is no impacted cerumen. Tympanic membrane is injected, erythematous and bulging.     Nose: Mucosal edema, congestion and rhinorrhea present. No nasal deformity, septal deviation, signs of injury or nasal tenderness. Rhinorrhea is clear.     Right Nostril: Occlusion present. No foreign body, epistaxis or septal hematoma.     Left Nostril: Occlusion present. No foreign body, epistaxis or septal hematoma.     Right Turbinates: Enlarged, swollen and pale.     Left Turbinates: Enlarged, swollen and pale.     Right Sinus: Maxillary sinus tenderness and frontal sinus tenderness present.     Left Sinus: Maxillary sinus  tenderness and frontal sinus tenderness present.     Mouth/Throat:     Lips: Pink. No lesions.     Mouth: Mucous membranes are moist. No oral lesions.     Pharynx: Oropharynx is clear. Uvula midline. No posterior oropharyngeal erythema or uvula swelling.     Tonsils: No tonsillar exudate. 0 on the right. 0 on the left.     Comments: Postnasal drip Eyes:     General: Lids are normal.        Right eye: No discharge.        Left eye: No discharge.     Extraocular Movements: Extraocular movements intact.     Conjunctiva/sclera: Conjunctivae normal.     Right eye: Right conjunctiva is not injected.     Left eye: Left conjunctiva is not injected.  Neck:     Trachea: Trachea and phonation normal.  Cardiovascular:     Rate and Rhythm: Normal rate and regular rhythm.     Pulses: Normal pulses.     Heart sounds: Normal heart sounds. No murmur heard.    No friction rub. No gallop.  Pulmonary:     Effort: Pulmonary effort is normal. No accessory muscle usage, prolonged expiration or respiratory distress.     Breath sounds: Normal breath sounds. No stridor, decreased air movement or transmitted upper airway sounds. No decreased breath sounds, wheezing, rhonchi or rales.  Chest:     Chest wall: No tenderness.  Musculoskeletal:        General: Normal range of motion.     Cervical back: Full passive range of motion without pain, normal range of motion and neck supple. No rigidity. Normal range of motion.  Lymphadenopathy:     Cervical: Cervical adenopathy present.     Right cervical: Superficial cervical adenopathy and posterior cervical adenopathy present.     Left cervical: Superficial cervical adenopathy and posterior cervical adenopathy present.  Skin:    General: Skin is warm and dry.     Findings: No erythema or rash.  Neurological:     General: No focal deficit present.  Mental Status: She is alert and oriented to person, place, and time.  Psychiatric:        Mood and Affect: Mood  normal.        Behavior: Behavior normal. Behavior is cooperative.     Visual Acuity Right Eye Distance:   Left Eye Distance:   Bilateral Distance:    Right Eye Near:   Left Eye Near:    Bilateral Near:     UC Couse / Diagnostics / Procedures:     Radiology No results found.  Procedures Procedures (including critical care time) EKG  Pending results:  Labs Reviewed  SARS CORONAVIRUS 2 (TAT 6-24 HRS)    Medications Ordered in UC: Medications - No data to display  UC Diagnoses / Final Clinical Impressions(s)   I have reviewed the triage vital signs and the nursing notes.  Pertinent labs & imaging results that were available during my care of the patient were reviewed by me and considered in my medical decision making (see chart for details).    Final diagnoses:  At risk for healthcare associated infection  Encounter for screening for COVID-19  Acute recurrent pansinusitis  Acute suppurative otitis media of left ear without spontaneous rupture of tympanic membrane, recurrence not specified  Rhinosinusitis  Postnasal drip   Patient provided with cefdinir to treat bacterial infection in left inner ear.  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  Patient provided with allergy medications to relieve symptoms of rhinitis, sinusitis.  Return precautions advised.  ED Prescriptions     Medication Sig Dispense Auth. Provider   cefdinir (OMNICEF) 300 MG capsule Take 1 capsule (300 mg total) by mouth 2 (two) times daily for 10 days. 20 capsule Lynden Oxford Scales, PA-C   cetirizine (ZYRTEC ALLERGY) 10 MG tablet Take 1 tablet (10 mg total) by mouth at bedtime. 90 tablet Lynden Oxford Scales, PA-C   fluticasone (FLONASE) 50 MCG/ACT nasal spray Place 1 spray into both nostrils daily. 47.4 mL Lynden Oxford Scales, PA-C   ipratropium (ATROVENT) 0.06 % nasal spray Place 2  sprays into both nostrils 3 (three) times daily. As needed for nasal congestion, runny nose 15 mL Lynden Oxford Scales, PA-C      PDMP not reviewed this encounter.  Disposition Upon Discharge:  Condition: stable for discharge home Home: take medications as prescribed; routine discharge instructions as discussed; follow up as advised.  Patient presented with an acute illness with associated systemic symptoms and significant discomfort requiring urgent management. In my opinion, this is a condition that a prudent lay person (someone who possesses an average knowledge of health and medicine) may potentially expect to result in complications if not addressed urgently such as respiratory distress, impairment of bodily function or dysfunction of bodily organs.   Routine symptom specific, illness specific and/or disease specific instructions were discussed with the patient and/or caregiver at length.   As such, the patient has been evaluated and assessed, work-up was performed and treatment was provided in alignment with urgent care protocols and evidence based medicine.  Patient/parent/caregiver has been advised that the patient may require follow up for further testing and treatment if the symptoms continue in spite of treatment, as clinically indicated and appropriate.  If the patient was tested for COVID-19, Influenza and/or RSV, then the patient/parent/guardian was advised to isolate at home pending the results of his/her diagnostic coronavirus test and potentially longer if  they're positive. I have also advised pt that if his/her COVID-19 test returns positive, it's recommended to self-isolate for at least 10 days after symptoms first appeared AND until fever-free for 24 hours without fever reducer AND other symptoms have improved or resolved. Discussed self-isolation recommendations as well as instructions for household member/close contacts as per the [facility] and [place] DH[facility], and also gave patient the  [place] packet with this information.  Patient/parent/caregiver has been advised to return to the [facility] or PCP in 3-5 days if no better; to PCP or the Emergency Department if new signs and symptoms develop, or if the current signs or symptoms continue to change or worsen for further workup, evaluation and treatment as clinically indicated and appropriate  The patient will follow up with their current PCP if and as advised. If the patient does not currently have a PCP we will assist them in obtaining one.   The patient may need specialty follow up if the symptoms continue, in spite of conservative treatment and management, for further workup, evaluation, consultation and treatment as clinically indicated and appropriate.  Patient/parent/caregiver verbalized understanding and agreement of plan as discussed.  All questions were addressed during visit.  Please see discharge instructions below for further details of plan.  Discharge Instructions:   Discharge Instructions      You received a COVID-19 PCR test today.  The result of your COVID-19 test will be posted to your MyChart once it is complete, typically this takes 6 to 12 hours.     If your COVID-19 PCR test is positive, you will be contacted by phone.  Please discuss with the callback nurse whether or not you would benefit from antiviral therapy treatment for COVID-19.     If your COVID-19 PCR test is negative, then your illness is likely due to one of the many less serious illnesses that are circulating in our community right now.  Conservative care is recommended with rest, drinking plenty of clear fluids, eating only when hungry, eating supportive medications for your symptoms and avoiding being around other people.  Please remain at home until you are fever free for 24 hours without the use of antifever medications such as Tylenol and ibuprofen.  Please read below to learn more about the medications, dosages and frequencies that I recommend  to help alleviate your symptoms and to get you feeling better soon:   Omnicef (cefdinir): To treat the bacterial infection in your left ear, please 1 capsule twice daily for 10 days, you can take it with or without food.  This antibiotic will also eliminate any bacteria that is causing complications of sinusitis.  I believe that you will have good relief of all of your symptoms after completing 10 days of cefdinir.      Zyrtec (cetirizine): This is an excellent second-generation antihistamine that helps to reduce respiratory inflammatory response to environmental allergens.  In some patients, this medication can cause daytime sleepiness so I recommend that you take 1 tablet daily at bedtime.     Flonase (fluticasone): This is a steroid nasal spray that you use once daily, 1 spray in each nare.  This medication does not work well if you decide to use it only used as you feel you need to, it works best used on a daily basis.  After 3 to 5 days of use, you will notice significant reduction of the inflammation and mucus production that is currently being caused by exposure to allergens, whether seasonal or environmental.  The  most common side effect of this medication is nosebleeds.  If you experience a nosebleed, please discontinue use for 1 week, then feel free to resume.  I have provided you with a prescription.     Atrovent (ipratropium): This is an excellent nasal decongestant spray I have added to your recommended nasal steroid that will not cause rebound congestion, please instill 2 sprays into each nare with each use.  Because nasal steroids can take several days before they begin to provide full benefit, I recommend that you use this spray in addition to the nasal steroid prescribed for you.  Please use it after you have used your nasal steroid and repeat up to 4 times daily as needed.  I have provided you with a prescription for this medication.      Please follow-up within the next 5-7 days either  with your primary care provider or urgent care if your symptoms do not resolve.  If you do not have a primary care provider, we will assist you in finding one.        Thank you for visiting urgent care today.  We appreciate the opportunity to participate in your care.         This office note has been dictated using Museum/gallery curator.  Unfortunately, this method of dictation can sometimes lead to typographical or grammatical errors.  I apologize for your inconvenience in advance if this occurs.  Please do not hesitate to reach out to me if clarification is needed.      Lynden Oxford Scales, PA-C 03/28/22 1554

## 2022-03-28 NOTE — ED Triage Notes (Addendum)
Pt c/o headache, cough (yellow/ green drainage), sinus pressure, post nasal drip and congestion x 3 days. 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   Home interventions: tylenol

## 2022-03-28 NOTE — Discharge Instructions (Addendum)
You received a COVID-19 PCR test today.  The result of your COVID-19 test will be posted to your MyChart once it is complete, typically this takes 6 to 12 hours.     If your COVID-19 PCR test is positive, you will be contacted by phone.  Please discuss with the callback nurse whether or not you would benefit from antiviral therapy treatment for COVID-19.     If your COVID-19 PCR test is negative, then your illness is likely due to one of the many less serious illnesses that are circulating in our community right now.  Conservative care is recommended with rest, drinking plenty of clear fluids, eating only when hungry, eating supportive medications for your symptoms and avoiding being around other people.  Please remain at home until you are fever free for 24 hours without the use of antifever medications such as Tylenol and ibuprofen.  Please read below to learn more about the medications, dosages and frequencies that I recommend to help alleviate your symptoms and to get you feeling better soon:   Omnicef (cefdinir): To treat the bacterial infection in your left ear, please 1 capsule twice daily for 10 days, you can take it with or without food.  This antibiotic will also eliminate any bacteria that is causing complications of sinusitis.  I believe that you will have good relief of all of your symptoms after completing 10 days of cefdinir.      Zyrtec (cetirizine): This is an excellent second-generation antihistamine that helps to reduce respiratory inflammatory response to environmental allergens.  In some patients, this medication can cause daytime sleepiness so I recommend that you take 1 tablet daily at bedtime.     Flonase (fluticasone): This is a steroid nasal spray that you use once daily, 1 spray in each nare.  This medication does not work well if you decide to use it only used as you feel you need to, it works best used on a daily basis.  After 3 to 5 days of use, you will notice significant  reduction of the inflammation and mucus production that is currently being caused by exposure to allergens, whether seasonal or environmental.  The most common side effect of this medication is nosebleeds.  If you experience a nosebleed, please discontinue use for 1 week, then feel free to resume.  I have provided you with a prescription.     Atrovent (ipratropium): This is an excellent nasal decongestant spray I have added to your recommended nasal steroid that will not cause rebound congestion, please instill 2 sprays into each nare with each use.  Because nasal steroids can take several days before they begin to provide full benefit, I recommend that you use this spray in addition to the nasal steroid prescribed for you.  Please use it after you have used your nasal steroid and repeat up to 4 times daily as needed.  I have provided you with a prescription for this medication.      Please follow-up within the next 5-7 days either with your primary care provider or urgent care if your symptoms do not resolve.  If you do not have a primary care provider, we will assist you in finding one.        Thank you for visiting urgent care today.  We appreciate the opportunity to participate in your care.

## 2022-03-28 NOTE — ED Triage Notes (Signed)
Pt states been seen earlier today d/t increased congestion, headache, and cough. Was sent home with medications that have not improved s/s. Now c/o worsening headache, nausea, unable to keep medications down. Tylenol taken at 7:30 pm. 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

## 2022-03-29 LAB — SARS CORONAVIRUS 2 (TAT 6-24 HRS): SARS Coronavirus 2: NEGATIVE

## 2022-03-29 LAB — RESP PANEL BY RT-PCR (FLU A&B, COVID) ARPGX2
Influenza A by PCR: NEGATIVE
Influenza B by PCR: NEGATIVE
SARS Coronavirus 2 by RT PCR: NEGATIVE

## 2022-04-05 ENCOUNTER — Other Ambulatory Visit (HOSPITAL_BASED_OUTPATIENT_CLINIC_OR_DEPARTMENT_OTHER): Payer: Self-pay

## 2022-04-05 MED ORDER — LOSARTAN POTASSIUM 100 MG PO TABS
100.0000 mg | ORAL_TABLET | Freq: Every day | ORAL | 0 refills | Status: AC
  Filled 2022-04-05: qty 90, 90d supply, fill #0

## 2022-04-08 ENCOUNTER — Other Ambulatory Visit (HOSPITAL_BASED_OUTPATIENT_CLINIC_OR_DEPARTMENT_OTHER): Payer: Self-pay

## 2022-04-08 MED ORDER — SEMAGLUTIDE-WEIGHT MANAGEMENT 2.4 MG/0.75ML ~~LOC~~ SOAJ
SUBCUTANEOUS | 0 refills | Status: DC
  Filled 2022-04-08: qty 3, 28d supply, fill #0

## 2022-04-30 ENCOUNTER — Other Ambulatory Visit (HOSPITAL_BASED_OUTPATIENT_CLINIC_OR_DEPARTMENT_OTHER): Payer: Self-pay

## 2022-04-30 MED ORDER — WEGOVY 2.4 MG/0.75ML ~~LOC~~ SOAJ
2.4000 mg | SUBCUTANEOUS | 0 refills | Status: DC
  Filled 2022-04-30: qty 3, 28d supply, fill #0

## 2022-05-05 ENCOUNTER — Emergency Department (HOSPITAL_COMMUNITY): Payer: 59

## 2022-05-05 ENCOUNTER — Encounter (HOSPITAL_COMMUNITY): Payer: Self-pay | Admitting: *Deleted

## 2022-05-05 ENCOUNTER — Other Ambulatory Visit: Payer: Self-pay

## 2022-05-05 ENCOUNTER — Emergency Department (HOSPITAL_COMMUNITY)
Admission: EM | Admit: 2022-05-05 | Discharge: 2022-05-05 | Discharge status: Against Medical Advice | Payer: 59 | Attending: Emergency Medicine | Admitting: Emergency Medicine

## 2022-05-05 DIAGNOSIS — M542 Cervicalgia: Secondary | ICD-10-CM | POA: Insufficient documentation

## 2022-05-05 DIAGNOSIS — M79602 Pain in left arm: Secondary | ICD-10-CM | POA: Diagnosis not present

## 2022-05-05 DIAGNOSIS — M25512 Pain in left shoulder: Secondary | ICD-10-CM | POA: Diagnosis not present

## 2022-05-05 DIAGNOSIS — Z5321 Procedure and treatment not carried out due to patient leaving prior to being seen by health care provider: Secondary | ICD-10-CM | POA: Insufficient documentation

## 2022-05-05 NOTE — ED Triage Notes (Signed)
The pt is c/o lt arm pain for approx one  week  she has had some neck pain  she has tried to be seen at other facilities before she arrived here

## 2022-05-05 NOTE — ED Provider Triage Note (Signed)
Emergency Medicine Provider Triage Evaluation Note  IA LEEB , a 46 y.o. female  was evaluated in triage.  Pt complains of left arm pain with radiation to the hand.  She reports pain that is worse when she turns her neck and pain that goes into her ring and small fingers of her left hand.  She does feel pain from the shoulder area into the upper arm as well.  She is a Equities trader.  She states that she could have had an injury when she was helping to stop the patient from falling.  She was concerned about an MI as well.   Review of Systems  Positive: Neck, shoulder, arm pain Negative: Chest pain  Physical Exam  BP (!) 200/116 (BP Location: Right Arm)   Pulse 86   Temp 98.3 F (36.8 C)   Resp 16   SpO2 99%  Gen:   Awake, no distress   Resp:  Normal effort  MSK:   Moves extremities without difficulty  Other:  2+ radial pulse on the left, distal arm appears well-perfused. Medical Decision Making  Medically screening exam initiated at 5:28 PM.  Appropriate orders placed.  [name] was informed that the remainder of the evaluation will be completed by another provider, this initial triage assessment does not replace that evaluation, and the importance of remaining in the ED until their evaluation is complete.     Carlisle Cater, PA-C 05/05/22 1730

## 2022-05-05 NOTE — ED Notes (Signed)
Pt decded to leave while waiting for a room.

## 2022-05-28 ENCOUNTER — Other Ambulatory Visit (HOSPITAL_BASED_OUTPATIENT_CLINIC_OR_DEPARTMENT_OTHER): Payer: Self-pay

## 2022-05-28 MED ORDER — WEGOVY 2.4 MG/0.75ML ~~LOC~~ SOAJ
0.7500 mL | SUBCUTANEOUS | 0 refills | Status: DC
  Filled 2022-05-28: qty 3, 28d supply, fill #0

## 2022-06-06 ENCOUNTER — Other Ambulatory Visit (HOSPITAL_BASED_OUTPATIENT_CLINIC_OR_DEPARTMENT_OTHER): Payer: Self-pay

## 2022-06-19 DIAGNOSIS — M792 Neuralgia and neuritis, unspecified: Secondary | ICD-10-CM | POA: Diagnosis not present

## 2022-06-19 DIAGNOSIS — S92354D Nondisplaced fracture of fifth metatarsal bone, right foot, subsequent encounter for fracture with routine healing: Secondary | ICD-10-CM | POA: Diagnosis not present

## 2022-06-24 ENCOUNTER — Other Ambulatory Visit (HOSPITAL_BASED_OUTPATIENT_CLINIC_OR_DEPARTMENT_OTHER): Payer: Self-pay

## 2022-06-25 ENCOUNTER — Other Ambulatory Visit (HOSPITAL_BASED_OUTPATIENT_CLINIC_OR_DEPARTMENT_OTHER): Payer: Self-pay

## 2022-06-25 MED ORDER — WEGOVY 2.4 MG/0.75ML ~~LOC~~ SOAJ
SUBCUTANEOUS | 0 refills | Status: DC
  Filled 2022-06-25: qty 3, 28d supply, fill #0

## 2022-06-29 ENCOUNTER — Other Ambulatory Visit (HOSPITAL_BASED_OUTPATIENT_CLINIC_OR_DEPARTMENT_OTHER): Payer: Self-pay

## 2022-06-29 MED ORDER — CHOLECALCIFEROL 1.25 MG (50000 UT) PO CAPS
50000.0000 [IU] | ORAL_CAPSULE | ORAL | 0 refills | Status: AC
  Filled 2022-06-29 – 2022-08-14 (×2): qty 12, 84d supply, fill #0

## 2022-06-29 MED ORDER — ATORVASTATIN CALCIUM 40 MG PO TABS
40.0000 mg | ORAL_TABLET | Freq: Every day | ORAL | 3 refills | Status: DC
  Filled 2022-06-29: qty 90, 90d supply, fill #0

## 2022-06-29 MED ORDER — CETIRIZINE HCL 10 MG PO TABS
10.0000 mg | ORAL_TABLET | Freq: Every day | ORAL | 1 refills | Status: AC
  Filled 2022-06-29 – 2022-10-13 (×2): qty 90, 90d supply, fill #0

## 2022-06-29 MED ORDER — FUROSEMIDE 40 MG PO TABS
40.0000 mg | ORAL_TABLET | Freq: Every day | ORAL | 3 refills | Status: DC
  Filled 2022-06-29: qty 90, 90d supply, fill #0

## 2022-06-29 MED ORDER — DILTIAZEM HCL ER COATED BEADS 360 MG PO CP24
360.0000 mg | ORAL_CAPSULE | Freq: Every day | ORAL | 1 refills | Status: DC
  Filled 2022-06-29 – 2023-04-02 (×3): qty 30, 30d supply, fill #0

## 2022-06-29 MED ORDER — CARVEDILOL 25 MG PO TABS
25.0000 mg | ORAL_TABLET | Freq: Two times a day (BID) | ORAL | 10 refills | Status: DC
  Filled 2022-06-29: qty 60, 30d supply, fill #0

## 2022-06-29 MED ORDER — DILTIAZEM HCL ER COATED BEADS 360 MG PO CP24: 360.0000 mg | ORAL_CAPSULE | Freq: Every day | ORAL | 1 refills | Status: AC

## 2022-07-06 ENCOUNTER — Other Ambulatory Visit (HOSPITAL_BASED_OUTPATIENT_CLINIC_OR_DEPARTMENT_OTHER): Payer: Self-pay

## 2022-07-08 DIAGNOSIS — S92354D Nondisplaced fracture of fifth metatarsal bone, right foot, subsequent encounter for fracture with routine healing: Secondary | ICD-10-CM | POA: Diagnosis not present

## 2022-07-08 DIAGNOSIS — M792 Neuralgia and neuritis, unspecified: Secondary | ICD-10-CM | POA: Diagnosis not present

## 2022-07-25 ENCOUNTER — Other Ambulatory Visit (HOSPITAL_BASED_OUTPATIENT_CLINIC_OR_DEPARTMENT_OTHER): Payer: Self-pay

## 2022-07-25 MED ORDER — WEGOVY 2.4 MG/0.75ML ~~LOC~~ SOAJ
SUBCUTANEOUS | 0 refills | Status: DC
  Filled 2022-07-25 – 2022-07-29 (×2): qty 3, 28d supply, fill #0

## 2022-07-26 ENCOUNTER — Encounter (HOSPITAL_BASED_OUTPATIENT_CLINIC_OR_DEPARTMENT_OTHER): Payer: Self-pay

## 2022-07-26 ENCOUNTER — Other Ambulatory Visit (HOSPITAL_BASED_OUTPATIENT_CLINIC_OR_DEPARTMENT_OTHER): Payer: Self-pay

## 2022-07-29 ENCOUNTER — Other Ambulatory Visit (HOSPITAL_BASED_OUTPATIENT_CLINIC_OR_DEPARTMENT_OTHER): Payer: Self-pay

## 2022-07-30 ENCOUNTER — Other Ambulatory Visit (HOSPITAL_BASED_OUTPATIENT_CLINIC_OR_DEPARTMENT_OTHER): Payer: Self-pay

## 2022-08-09 DIAGNOSIS — S92354D Nondisplaced fracture of fifth metatarsal bone, right foot, subsequent encounter for fracture with routine healing: Secondary | ICD-10-CM | POA: Diagnosis not present

## 2022-08-12 ENCOUNTER — Other Ambulatory Visit (HOSPITAL_BASED_OUTPATIENT_CLINIC_OR_DEPARTMENT_OTHER): Payer: Self-pay

## 2022-08-12 ENCOUNTER — Encounter (HOSPITAL_BASED_OUTPATIENT_CLINIC_OR_DEPARTMENT_OTHER): Payer: Self-pay | Admitting: Pharmacist

## 2022-08-13 ENCOUNTER — Other Ambulatory Visit (HOSPITAL_BASED_OUTPATIENT_CLINIC_OR_DEPARTMENT_OTHER): Payer: Self-pay

## 2022-08-14 ENCOUNTER — Other Ambulatory Visit (HOSPITAL_BASED_OUTPATIENT_CLINIC_OR_DEPARTMENT_OTHER): Payer: Self-pay

## 2022-08-14 DIAGNOSIS — E559 Vitamin D deficiency, unspecified: Secondary | ICD-10-CM | POA: Diagnosis not present

## 2022-08-14 MED ORDER — ONDANSETRON HCL 8 MG PO TABS
8.0000 mg | ORAL_TABLET | Freq: Three times a day (TID) | ORAL | 0 refills | Status: AC | PRN
  Filled 2022-08-14: qty 20, 7d supply, fill #0

## 2022-08-14 MED ORDER — IRBESARTAN 150 MG PO TABS
150.0000 mg | ORAL_TABLET | Freq: Every day | ORAL | 5 refills | Status: AC
  Filled 2022-08-14: qty 30, 30d supply, fill #0

## 2022-08-14 MED ORDER — WEGOVY 2.4 MG/0.75ML ~~LOC~~ SOAJ
2.4000 mg | SUBCUTANEOUS | 0 refills | Status: DC
  Filled 2022-08-14 – 2022-08-21 (×3): qty 3, 28d supply, fill #0

## 2022-08-15 ENCOUNTER — Other Ambulatory Visit (HOSPITAL_BASED_OUTPATIENT_CLINIC_OR_DEPARTMENT_OTHER): Payer: Self-pay

## 2022-08-21 ENCOUNTER — Other Ambulatory Visit (HOSPITAL_BASED_OUTPATIENT_CLINIC_OR_DEPARTMENT_OTHER): Payer: Self-pay

## 2022-08-21 MED ORDER — HYDROCORTISONE ACETATE 25 MG RE SUPP
RECTAL | 0 refills | Status: AC
  Filled 2022-08-21: qty 20, 10d supply, fill #0

## 2022-08-22 ENCOUNTER — Other Ambulatory Visit (HOSPITAL_BASED_OUTPATIENT_CLINIC_OR_DEPARTMENT_OTHER): Payer: Self-pay

## 2022-08-30 ENCOUNTER — Ambulatory Visit: Payer: Self-pay

## 2022-08-30 DIAGNOSIS — Z20828 Contact with and (suspected) exposure to other viral communicable diseases: Secondary | ICD-10-CM | POA: Diagnosis not present

## 2022-08-30 DIAGNOSIS — R059 Cough, unspecified: Secondary | ICD-10-CM | POA: Diagnosis not present

## 2022-08-30 DIAGNOSIS — R110 Nausea: Secondary | ICD-10-CM | POA: Diagnosis not present

## 2022-08-30 DIAGNOSIS — J069 Acute upper respiratory infection, unspecified: Secondary | ICD-10-CM | POA: Diagnosis not present

## 2022-08-30 DIAGNOSIS — Z6841 Body Mass Index (BMI) 40.0 and over, adult: Secondary | ICD-10-CM | POA: Diagnosis not present

## 2022-09-09 ENCOUNTER — Ambulatory Visit (INDEPENDENT_AMBULATORY_CARE_PROVIDER_SITE_OTHER): Payer: Commercial Managed Care - PPO | Admitting: Physician Assistant

## 2022-09-09 ENCOUNTER — Other Ambulatory Visit (INDEPENDENT_AMBULATORY_CARE_PROVIDER_SITE_OTHER): Payer: Commercial Managed Care - PPO

## 2022-09-09 ENCOUNTER — Encounter: Payer: Self-pay | Admitting: Physician Assistant

## 2022-09-09 DIAGNOSIS — M1711 Unilateral primary osteoarthritis, right knee: Secondary | ICD-10-CM

## 2022-09-09 DIAGNOSIS — M1712 Unilateral primary osteoarthritis, left knee: Secondary | ICD-10-CM

## 2022-09-09 MED ORDER — METHYLPREDNISOLONE ACETATE 40 MG/ML IJ SUSP
80.0000 mg | INTRAMUSCULAR | Status: AC | PRN
Start: 2022-09-09 — End: 2022-09-09
  Administered 2022-09-09: 80 mg via INTRA_ARTICULAR

## 2022-09-09 MED ORDER — BUPIVACAINE HCL 0.25 % IJ SOLN
2.0000 mL | INTRAMUSCULAR | Status: AC | PRN
Start: 2022-09-09 — End: 2022-09-09
  Administered 2022-09-09: 2 mL via INTRA_ARTICULAR

## 2022-09-09 MED ORDER — BUPIVACAINE HCL 0.25 % IJ SOLN
2.0000 mL | INTRAMUSCULAR | Status: AC | PRN
  Administered 2022-09-09: 2 mL via INTRA_ARTICULAR

## 2022-09-09 MED ORDER — LIDOCAINE HCL 1 % IJ SOLN
2.0000 mL | INTRAMUSCULAR | Status: AC | PRN
Start: 2022-09-09 — End: 2022-09-09
  Administered 2022-09-09: 2 mL

## 2022-09-09 NOTE — Progress Notes (Signed)
Office Visit Note   Patient: Annette Deleon           Date of Birth: 06-18-1975           MRN: [id number] Visit Date: 09/09/2022              Requested by: Stevphen Rochester, MD [address] [place],  [place] [address] PCP: Stevphen Rochester, MD   Assessment & Plan: Visit Diagnoses:  1. Unilateral primary osteoarthritis, left knee   2. 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  She has a history of left ACL reconstruction and right knee arthroscopy.  She been having increasing knee pain bilaterally.  She has been treated with injections in the past and has been helpful.  She cannot take anti-inflammatories tries to take Tylenol.  Has difficulty going up and down stairs.  Findings consistent with her arthritic symptoms right more patellofemoral than left.  Will go forward and try bilateral steroid injections.  If these do not help her could consider viscosupplementation and/or physical therapy Follow-Up Instructions: No follow-ups on file.   Orders:  Orders Placed This Encounter  Procedures  . XR KNEE 3 VIEW RIGHT  . XR KNEE 3 VIEW LEFT   No orders of the defined types were placed in this encounter.     Procedures: Large Joint Inj: bilateral knee on 09/09/2022 2:58 PM Indications: pain and diagnostic evaluation Details: 25 G 1.5 in needle, anteromedial approach  Arthrogram: No  Medications (Right): 2 mL lidocaine 1 %; 2 mL bupivacaine 0.25 %; 80 mg methylPREDNISolone acetate 40 MG/ML Medications (Left): 2 mL lidocaine 1 %; 2 mL bupivacaine 0.25 %; 80 mg methylPREDNISolone acetate 40 MG/ML Outcome: tolerated well, no immediate complications Procedure, treatment alternatives, risks and benefits explained, specific risks discussed. Consent was given by the patient. Immediately prior to procedure a time out was called to verify the correct patient, procedure, equipment, support staff and  site/side marked as required. Patient was prepped and draped in the usual sterile fashion.     Clinical Data: No additional findings.   Subjective: Chief Complaint  Patient presents with  . Right Knee - Pain  . Left Knee - Pain    HPI patient is a pleasant 47 year old woman with a chief complaint of bilateral knee pain.  She had a fall about a year and a half ago.  She has had surgery on both knees.  States her knees hurt with bending going up stairs she feels like she has swelling both of her knees.  She cannot take ibuprofen but has tried Tylenol heat and ice she has had arthroscopy on the right knee and arthroscopy ACL on the left knee.  She cannot take ibuprofen because of stomach issues.  She cannot take oral prednisone because of palpitations but she has had bilateral injections into her knee before which seem to help  Review of Systems  All other systems reviewed and are negative.    Objective: Vital Signs: There were no vitals taken for this visit.  Physical Exam Constitutional:      Appearance: Normal appearance.  Pulmonary:     Effort: Pulmonary effort is normal.  Skin:    General: Skin is warm and dry.  Neurological:     General: No focal deficit present.     Mental Status: She is alert and oriented to person, place, and time.  Ortho Exam Bilateral knees no effusion no erythema compartments are soft and nontender her strength is intact she is neurovascularly intact.  She has good varus valgus and anterior stability.  Well-healed surgical incisions. Specialty Comments:  No specialty comments available.  Imaging: No results found.   PMFS History: Patient Active Problem List   Diagnosis Date Noted  . COVID-19 virus infection 07/03/2020  . Acute diastolic CHF (congestive heart failure) 07/03/2020  . Chest pain, rule out acute myocardial infarction 06/13/2018  . Aortic atherosclerosis   . Pure hypercholesterolemia   . Near syncope 09/08/2014  .  Complicated migraine 11/21/2011  . HTN (hypertension) 11/20/2011  . Pseudotumor cerebri 11/20/2011  . Obesity 11/20/2011   Past Medical History:  Diagnosis Date  . Hypertension   . Migraines   . Obesity   . Pseudotumor cerebri   . Sleep apnea     Family History  Problem Relation Age of Onset  . Heart attack Father 18    Past Surgical History:  Procedure Laterality Date  . ARTHROSCOPIC REPAIR ACL    . CESAREAN SECTION    . CHOLECYSTECTOMY    . KNEE SURGERY     right  . MYOMECTOMY     Social History   Occupational History  . Not on file  Tobacco Use  . Smoking status: Never  . Smokeless tobacco: Never  Vaping Use  . Vaping Use: Never used  Substance and Sexual Activity  . Alcohol use: Yes    Comment: occasionally  . Drug use: No  . Sexual activity: Not on file

## 2022-09-13 ENCOUNTER — Encounter: Payer: Self-pay | Admitting: Physician Assistant

## 2022-09-13 ENCOUNTER — Ambulatory Visit: Payer: Commercial Managed Care - PPO | Admitting: Physician Assistant

## 2022-09-13 ENCOUNTER — Other Ambulatory Visit (HOSPITAL_BASED_OUTPATIENT_CLINIC_OR_DEPARTMENT_OTHER): Payer: Self-pay

## 2022-09-13 ENCOUNTER — Telehealth: Payer: Self-pay | Admitting: Physician Assistant

## 2022-09-13 DIAGNOSIS — M1711 Unilateral primary osteoarthritis, right knee: Secondary | ICD-10-CM

## 2022-09-13 MED ORDER — WEGOVY 2.4 MG/0.75ML ~~LOC~~ SOAJ
2.4000 mg | SUBCUTANEOUS | 1 refills | Status: AC
  Filled 2022-09-13: qty 3, 28d supply, fill #0
  Filled 2022-10-13: qty 3, 28d supply, fill #1

## 2022-09-13 NOTE — Telephone Encounter (Signed)
Patient called. Would like a referral for PT, also a referral for pain management.

## 2022-09-13 NOTE — Progress Notes (Signed)
Office Visit Note   Patient: Annette Deleon           Date of Birth: 1976/04/28           MRN: [id number] Visit Date: 09/13/2022              Requested by: Stevphen Rochester, MD [address] [place],  [place] [address] PCP: Stevphen Rochester, MD  Chief Complaint  Patient presents with   Right Knee - Follow-up   Left Knee - Follow-up      HPI: Ms. Keir is a pleasant 47 year old woman who I saw few days ago for bilateral knee pain.  She has a history of knee arthroscopy on both of her knees and additionally an ACL reconstruction on the left.  She has had long standing arthritis.  About a year and a half ago she said she fell over a washing machine and came down hard on her knees.  When she thought saw me a few days ago I felt her findings were consistent with arthritis and we went forward with bilateral cortisone injections which she has had in the past.  She is here tearful today saying that the injection into her right knee made her worse.  She denies any fever or chills.  Assessment & Plan: Visit Diagnoses: Osteoarthritis bilateral knees  Plan: I reviewed her x-rays with Dr. Ophelia Charter today.  I also reviewed her history and exam.  He feels that her problem is referred from the arthritis.  Hopefully she will get better improvement over the coming days when her right knee.  She did not have any sign of infection no redness no effusion.  Her compartments are soft and nontender.  Negative Homans' sign.  Her BMI based on her height at 5 3 and her reported weight of 237 is 42.  She asked about arthroscopy and I told her that given the amount of arthritis in her knees that would be very unpredictable to see if an arthroscopy would help and that most of our providers here would recommend knee replacement she is willing to try some physical therapy and I will order this but she wants to check where to go so she will contact me  Follow-Up Instructions: No follow-ups on file.   Ortho  Exam  Patient is alert, oriented, no adenopathy, well-dressed, normal affect, normal respiratory effort. Bilateral knees she has no effusion no erythema on the right knee compartments are soft and compressible she has good strength.  She is globally tender medially and laterally.  There is no warmth.  She is neurovascular intact Imaging: No results found. No images are attached to the encounter.  Labs: Lab Results  Component Value Date   CRP 0.8 07/04/2020   CRP 0.8 07/04/2020   REPTSTATUS 07/28/2016 FINAL 07/27/2016   CULT NO GROWTH 07/27/2016     Lab Results  Component Value Date   ALBUMIN 3.7 11/10/2020   ALBUMIN 4.0 07/07/2020   ALBUMIN 3.6 06/11/2020    Lab Results  Component Value Date   MG 2.0 07/04/2020   No results found for: "VD25OH"  No results found for: "PREALBUMIN"    Latest Ref Rng & Units 11/10/2020    7:28 PM 07/07/2020    6:20 PM 07/04/2020    1:59 AM  CBC EXTENDED  WBC 4.0 - 10.5 K/uL 11.4  18.3  27.6   RBC 3.87 - 5.11 MIL/uL 4.94  5.17  4.85   Hemoglobin 12.0 -  15.0 g/dL 12.4  58.0  99.8   HCT 36.0 - 46.0 % 39.1  41.6  39.7   Platelets 150 - 400 K/uL 489  464  448   NEUT# 1.7 - 7.7 K/uL 8.6   22.4   Lymph# 0.7 - 4.0 K/uL 1.8   3.6      There is no height or weight on file to calculate BMI.  Orders:  No orders of the defined types were placed in this encounter.  No orders of the defined types were placed in this encounter.    Procedures: No procedures performed  Clinical Data: No additional findings.  ROS:  All other systems negative, except as noted in the HPI. Review of Systems  Objective: Vital Signs: There were no vitals taken for this visit.  Specialty Comments:  No specialty comments available.  PMFS History: Patient Active Problem List   Diagnosis Date Noted   COVID-19 virus infection 07/03/2020   Acute diastolic CHF (congestive heart failure) 07/03/2020   Chest pain, rule out acute myocardial infarction 06/13/2018    Aortic atherosclerosis    Pure hypercholesterolemia    Near syncope 09/08/2014   Complicated migraine 11/21/2011   HTN (hypertension) 11/20/2011   Pseudotumor cerebri 11/20/2011   Obesity 11/20/2011   Past Medical History:  Diagnosis Date   Hypertension    Migraines    Obesity    Pseudotumor cerebri    Sleep apnea     Family History  Problem Relation Age of Onset   Heart attack Father 70    Past Surgical History:  Procedure Laterality Date   ARTHROSCOPIC REPAIR ACL     CESAREAN SECTION     CHOLECYSTECTOMY     KNEE SURGERY     right   MYOMECTOMY     Social History   Occupational History   Not on file  Tobacco Use   Smoking status: Never   Smokeless tobacco: Never  Vaping Use   Vaping Use: Never used  Substance and Sexual Activity   Alcohol use: Yes    Comment: occasionally   Drug use: No   Sexual activity: Not on file

## 2022-09-16 ENCOUNTER — Other Ambulatory Visit (HOSPITAL_BASED_OUTPATIENT_CLINIC_OR_DEPARTMENT_OTHER): Payer: Self-pay

## 2022-09-16 ENCOUNTER — Other Ambulatory Visit: Payer: Self-pay | Admitting: Physician Assistant

## 2022-09-16 DIAGNOSIS — M1712 Unilateral primary osteoarthritis, left knee: Secondary | ICD-10-CM

## 2022-09-16 DIAGNOSIS — M1711 Unilateral primary osteoarthritis, right knee: Secondary | ICD-10-CM

## 2022-09-17 ENCOUNTER — Other Ambulatory Visit (HOSPITAL_BASED_OUTPATIENT_CLINIC_OR_DEPARTMENT_OTHER): Payer: Self-pay

## 2022-09-24 ENCOUNTER — Ambulatory Visit: Payer: Commercial Managed Care - PPO | Admitting: Physical Therapy

## 2022-09-27 ENCOUNTER — Other Ambulatory Visit (HOSPITAL_BASED_OUTPATIENT_CLINIC_OR_DEPARTMENT_OTHER): Payer: Self-pay

## 2022-09-27 DIAGNOSIS — I517 Cardiomegaly: Secondary | ICD-10-CM | POA: Diagnosis not present

## 2022-09-27 DIAGNOSIS — I340 Nonrheumatic mitral (valve) insufficiency: Secondary | ICD-10-CM | POA: Diagnosis not present

## 2022-09-27 DIAGNOSIS — M25561 Pain in right knee: Secondary | ICD-10-CM | POA: Diagnosis not present

## 2022-09-27 DIAGNOSIS — Z1211 Encounter for screening for malignant neoplasm of colon: Secondary | ICD-10-CM | POA: Diagnosis not present

## 2022-09-27 DIAGNOSIS — I10 Essential (primary) hypertension: Secondary | ICD-10-CM | POA: Diagnosis not present

## 2022-09-27 MED ORDER — IRBESARTAN 300 MG PO TABS
300.0000 mg | ORAL_TABLET | Freq: Every day | ORAL | 1 refills | Status: AC
  Filled 2022-09-27 – 2022-10-09 (×2): qty 30, 30d supply, fill #0
  Filled 2023-02-12: qty 30, 30d supply, fill #1

## 2022-09-30 NOTE — Therapy (Deleted)
OUTPATIENT PHYSICAL THERAPY LOWER EXTREMITY EVALUATION   Patient Name: Annette Deleon MRN: [id number] DOB:Oct 22, 1975, 47 y.o., female Today's Date: 09/30/2022  END OF SESSION:   Past Medical History:  Diagnosis Date   Hypertension    Migraines    Obesity    Pseudotumor cerebri    Sleep apnea    Past Surgical History:  Procedure Laterality Date   ARTHROSCOPIC REPAIR ACL     CESAREAN SECTION     CHOLECYSTECTOMY     KNEE SURGERY     right   MYOMECTOMY     Patient Active Problem List   Diagnosis Date Noted   COVID-19 virus infection 07/03/2020   Acute diastolic CHF (congestive heart failure) (HCC) 07/03/2020   Chest pain, rule out acute myocardial infarction 06/13/2018   Aortic atherosclerosis (HCC)    Pure hypercholesterolemia    Near syncope 09/08/2014   Complicated migraine 11/21/2011   HTN (hypertension) 11/20/2011   Pseudotumor cerebri 11/20/2011   Obesity 11/20/2011    PCP: ***  REFERRING PROVIDER: ***  REFERRING DIAG: ***  THERAPY DIAG:  No diagnosis found.  Rationale for Evaluation and Treatment: {HABREHAB:27488}  ONSET DATE: ***  SUBJECTIVE:   SUBJECTIVE STATEMENT: ***  PERTINENT HISTORY: *** PAIN:  Are you having pain? {OPRCPAIN:27236}  PRECAUTIONS: {Therapy precautions:24002}  WEIGHT BEARING RESTRICTIONS: {Yes ***/No:24003}  FALLS:  Has patient fallen in last 6 months? {fallsyesno:27318}  LIVING ENVIRONMENT: Lives with: {OPRC lives with:25569::"lives with their family"} Lives in: {Lives in:25570} Stairs: {opstairs:27293} Has following equipment at home: {Assistive devices:23999}  OCCUPATION: ***  PLOF: {PLOF:24004}  PATIENT GOALS: ***  NEXT MD VISIT: ***  OBJECTIVE:   DIAGNOSTIC FINDINGS: ***  PATIENT SURVEYS:  {rehab surveys:24030}  COGNITION: Overall cognitive status: {cognition:24006}     SENSATION: {sensation:27233}  EDEMA:  {edema:24020}  MUSCLE LENGTH: Hamstrings: Right *** deg; Left *** deg Thomas  test: Right *** deg; Left *** deg  POSTURE: {posture:25561}  PALPATION: ***  LOWER EXTREMITY ROM:  {AROM/PROM:27142} ROM Right eval Left eval  Hip flexion    Hip extension    Hip abduction    Hip adduction    Hip internal rotation    Hip external rotation    Knee flexion    Knee extension    Ankle dorsiflexion    Ankle plantarflexion    Ankle inversion    Ankle eversion     (Blank rows = not tested)  LOWER EXTREMITY MMT:  MMT Right eval Left eval  Hip flexion    Hip extension    Hip abduction    Hip adduction    Hip internal rotation    Hip external rotation    Knee flexion    Knee extension    Ankle dorsiflexion    Ankle plantarflexion    Ankle inversion    Ankle eversion     (Blank rows = not tested)  LOWER EXTREMITY SPECIAL TESTS:  {LEspecialtests:26242}  FUNCTIONAL TESTS:  {Functional tests:24029}  GAIT: Distance walked: *** Assistive device utilized: {Assistive devices:23999} Level of assistance: {Levels of assistance:24026} Comments: ***   TODAY'S TREATMENT:  DATE: ***    PATIENT EDUCATION:  Education details: *** Person educated: {Person educated:25204} Education method: {Education Method:25205} Education comprehension: {Education Comprehension:25206}  HOME EXERCISE PROGRAM: ***  ASSESSMENT:  CLINICAL IMPRESSION: Patient is a *** y.o. *** who was seen today for physical therapy evaluation and treatment for ***.   OBJECTIVE IMPAIRMENTS: {opptimpairments:25111}.   ACTIVITY LIMITATIONS: {activitylimitations:27494}  PARTICIPATION LIMITATIONS: {participationrestrictions:25113}  PERSONAL FACTORS: {Personal factors:25162} are also affecting patient's functional outcome.   REHAB POTENTIAL: {rehabpotential:25112}  CLINICAL DECISION MAKING: {clinical decision making:25114}  EVALUATION COMPLEXITY: {Evaluation  complexity:25115}   GOALS: Goals reviewed with patient? {yes/no:20286}  SHORT TERM GOALS: Target date: *** *** Baseline: Goal status: {GOALSTATUS:25110}  2.  *** Baseline:  Goal status: {GOALSTATUS:25110}  3.  *** Baseline:  Goal status: {GOALSTATUS:25110}  4.  *** Baseline:  Goal status: {GOALSTATUS:25110}  5.  *** Baseline:  Goal status: {GOALSTATUS:25110}  6.  *** Baseline:  Goal status: {GOALSTATUS:25110}  LONG TERM GOALS: Target date: ***  *** Baseline:  Goal status: {GOALSTATUS:25110}  2.  *** Baseline:  Goal status: {GOALSTATUS:25110}  3.  *** Baseline:  Goal status: {GOALSTATUS:25110}  4.  *** Baseline:  Goal status: {GOALSTATUS:25110}  5.  *** Baseline:  Goal status: {GOALSTATUS:25110}  6.  *** Baseline:  Goal status: {GOALSTATUS:25110}   PLAN:  PT FREQUENCY: {rehab frequency:25116}  PT DURATION: {rehab duration:25117}  PLANNED INTERVENTIONS: {rehab planned interventions:25118::"Therapeutic exercises","Therapeutic activity","Neuromuscular re-education","Balance training","Gait training","Patient/Family education","Self Care","Joint mobilization"}  PLAN FOR NEXT SESSION: ***   Champ Mungo, PT 09/30/2022, 3:38 PM

## 2022-10-01 ENCOUNTER — Ambulatory Visit: Payer: Commercial Managed Care - PPO | Admitting: Physical Therapy

## 2022-10-04 ENCOUNTER — Other Ambulatory Visit (HOSPITAL_BASED_OUTPATIENT_CLINIC_OR_DEPARTMENT_OTHER): Payer: Self-pay

## 2022-10-09 ENCOUNTER — Other Ambulatory Visit (HOSPITAL_BASED_OUTPATIENT_CLINIC_OR_DEPARTMENT_OTHER): Payer: Self-pay

## 2022-10-13 ENCOUNTER — Other Ambulatory Visit (HOSPITAL_BASED_OUTPATIENT_CLINIC_OR_DEPARTMENT_OTHER): Payer: Self-pay

## 2022-10-14 ENCOUNTER — Other Ambulatory Visit: Payer: Self-pay

## 2022-10-14 ENCOUNTER — Other Ambulatory Visit (HOSPITAL_BASED_OUTPATIENT_CLINIC_OR_DEPARTMENT_OTHER): Payer: Self-pay

## 2022-10-14 ENCOUNTER — Encounter (HOSPITAL_BASED_OUTPATIENT_CLINIC_OR_DEPARTMENT_OTHER): Payer: Self-pay

## 2022-10-14 MED ORDER — ATORVASTATIN CALCIUM 40 MG PO TABS
40.0000 mg | ORAL_TABLET | Freq: Every day | ORAL | 3 refills | Status: AC
  Filled 2022-10-14 – 2023-02-12 (×2): qty 90, 90d supply, fill #0

## 2022-10-14 MED ORDER — CARVEDILOL 25 MG PO TABS
25.0000 mg | ORAL_TABLET | Freq: Two times a day (BID) | ORAL | 10 refills | Status: AC
  Filled 2022-10-14 – 2023-04-02 (×2): qty 60, 30d supply, fill #0

## 2022-10-14 MED ORDER — FUROSEMIDE 40 MG PO TABS
40.0000 mg | ORAL_TABLET | Freq: Every day | ORAL | 3 refills | Status: AC
  Filled 2022-10-14 – 2023-02-12 (×2): qty 90, 90d supply, fill #0

## 2022-10-15 ENCOUNTER — Encounter: Payer: Self-pay | Admitting: Physical Medicine and Rehabilitation

## 2022-10-30 ENCOUNTER — Other Ambulatory Visit (HOSPITAL_BASED_OUTPATIENT_CLINIC_OR_DEPARTMENT_OTHER): Payer: Self-pay

## 2022-11-01 DIAGNOSIS — Z1211 Encounter for screening for malignant neoplasm of colon: Secondary | ICD-10-CM | POA: Diagnosis not present

## 2022-11-04 ENCOUNTER — Ambulatory Visit: Payer: Commercial Managed Care - PPO | Admitting: Physical Therapy

## 2022-11-07 LAB — COLOGUARD: COLOGUARD: NEGATIVE

## 2022-11-20 ENCOUNTER — Encounter
Payer: Commercial Managed Care - PPO | Attending: Physical Medicine and Rehabilitation | Admitting: Physical Medicine and Rehabilitation

## 2022-11-21 ENCOUNTER — Other Ambulatory Visit (HOSPITAL_BASED_OUTPATIENT_CLINIC_OR_DEPARTMENT_OTHER): Payer: Self-pay

## 2022-11-21 DIAGNOSIS — I517 Cardiomegaly: Secondary | ICD-10-CM | POA: Diagnosis not present

## 2022-11-21 DIAGNOSIS — R7303 Prediabetes: Secondary | ICD-10-CM | POA: Diagnosis not present

## 2022-11-21 DIAGNOSIS — Z6841 Body Mass Index (BMI) 40.0 and over, adult: Secondary | ICD-10-CM | POA: Diagnosis not present

## 2022-11-21 DIAGNOSIS — E7800 Pure hypercholesterolemia, unspecified: Secondary | ICD-10-CM | POA: Diagnosis not present

## 2022-11-21 DIAGNOSIS — I340 Nonrheumatic mitral (valve) insufficiency: Secondary | ICD-10-CM | POA: Diagnosis not present

## 2022-11-21 DIAGNOSIS — I10 Essential (primary) hypertension: Secondary | ICD-10-CM | POA: Diagnosis not present

## 2022-11-21 MED ORDER — ZEPBOUND 2.5 MG/0.5ML ~~LOC~~ SOAJ
2.5000 mg | SUBCUTANEOUS | 0 refills | Status: AC
  Filled 2022-11-21 – 2023-03-27 (×3): qty 2, 28d supply, fill #0

## 2022-11-22 ENCOUNTER — Other Ambulatory Visit (HOSPITAL_BASED_OUTPATIENT_CLINIC_OR_DEPARTMENT_OTHER): Payer: Self-pay

## 2022-11-22 ENCOUNTER — Encounter (HOSPITAL_BASED_OUTPATIENT_CLINIC_OR_DEPARTMENT_OTHER): Payer: Self-pay

## 2022-11-22 MED ORDER — ONDANSETRON HCL 4 MG PO TABS
8.0000 mg | ORAL_TABLET | Freq: Two times a day (BID) | ORAL | 1 refills | Status: AC | PRN
  Filled 2022-11-22 – 2023-02-12 (×2): qty 20, 5d supply, fill #0

## 2022-11-22 MED ORDER — ZEPBOUND 5 MG/0.5ML ~~LOC~~ SOAJ
5.0000 mg | SUBCUTANEOUS | 0 refills | Status: AC
  Filled 2022-11-22 – 2023-03-27 (×3): qty 2, 28d supply, fill #0

## 2022-11-29 ENCOUNTER — Other Ambulatory Visit (HOSPITAL_BASED_OUTPATIENT_CLINIC_OR_DEPARTMENT_OTHER): Payer: Self-pay

## 2022-12-07 ENCOUNTER — Other Ambulatory Visit (HOSPITAL_BASED_OUTPATIENT_CLINIC_OR_DEPARTMENT_OTHER): Payer: Self-pay

## 2022-12-16 ENCOUNTER — Other Ambulatory Visit (HOSPITAL_BASED_OUTPATIENT_CLINIC_OR_DEPARTMENT_OTHER): Payer: Self-pay

## 2023-02-12 ENCOUNTER — Other Ambulatory Visit (HOSPITAL_BASED_OUTPATIENT_CLINIC_OR_DEPARTMENT_OTHER): Payer: Self-pay

## 2023-02-14 ENCOUNTER — Telehealth: Payer: Self-pay | Admitting: Urgent Care

## 2023-02-14 ENCOUNTER — Ambulatory Visit
Admission: EM | Admit: 2023-02-14 | Discharge: 2023-02-14 | Disposition: A | Discharge status: Home / Self Care | Payer: Commercial Managed Care - PPO | Attending: Internal Medicine | Admitting: Internal Medicine

## 2023-02-14 ENCOUNTER — Other Ambulatory Visit (HOSPITAL_BASED_OUTPATIENT_CLINIC_OR_DEPARTMENT_OTHER): Payer: Self-pay

## 2023-02-14 DIAGNOSIS — I160 Hypertensive urgency: Secondary | ICD-10-CM | POA: Diagnosis not present

## 2023-02-14 DIAGNOSIS — Z79899 Other long term (current) drug therapy: Secondary | ICD-10-CM | POA: Diagnosis not present

## 2023-02-14 DIAGNOSIS — J45909 Unspecified asthma, uncomplicated: Secondary | ICD-10-CM | POA: Diagnosis not present

## 2023-02-14 DIAGNOSIS — Z7951 Long term (current) use of inhaled steroids: Secondary | ICD-10-CM | POA: Diagnosis not present

## 2023-02-14 DIAGNOSIS — R059 Cough, unspecified: Secondary | ICD-10-CM | POA: Diagnosis present

## 2023-02-14 DIAGNOSIS — B349 Viral infection, unspecified: Secondary | ICD-10-CM | POA: Diagnosis not present

## 2023-02-14 DIAGNOSIS — U071 COVID-19: Secondary | ICD-10-CM | POA: Diagnosis not present

## 2023-02-14 LAB — POCT INFLUENZA A/B
Influenza A, POC: NEGATIVE
Influenza B, POC: NEGATIVE

## 2023-02-14 MED ORDER — PROMETHAZINE-DM 6.25-15 MG/5ML PO SYRP
5.0000 mL | ORAL_SOLUTION | Freq: Three times a day (TID) | ORAL | 0 refills | Status: AC | PRN
Start: 2023-02-14 — End: ?
  Filled 2023-02-14: qty 200, 14d supply, fill #0

## 2023-02-14 MED ORDER — ALBUTEROL SULFATE HFA 108 (90 BASE) MCG/ACT IN AERS
1.0000 | INHALATION_SPRAY | Freq: Four times a day (QID) | RESPIRATORY_TRACT | 0 refills | Status: AC | PRN
Start: 2023-02-14 — End: ?
  Filled 2023-02-14: qty 6.7, 25d supply, fill #0

## 2023-02-14 MED ORDER — PAXLOVID (300/100) 20 X 150 MG & 10 X 100MG PO TBPK
ORAL_TABLET | ORAL | 0 refills | Status: AC
Start: 2023-02-14 — End: ?
  Filled 2023-02-14: qty 30, 5d supply, fill #0

## 2023-02-14 NOTE — ED Triage Notes (Addendum)
Pt cough, body aches, nausea, post nasal drip x 3 days-states she feels same as when she had "long covid"-denies taking covid test PTA-NAD-steady gait

## 2023-02-14 NOTE — Discharge Instructions (Addendum)
I will call you with your influenza test result later today.  Otherwise, we will notify you of your test results as they arrive and may take between about 24 hours.  I encourage you to sign up for MyChart if you have not already done so as this can be the easiest way for [place] to communicate results to you online or through a phone app.  Generally, we only contact you if it is a positive test result.  In the meantime, if you develop worsening symptoms including fever, chest pain, shortness of breath despite our current treatment plan then please report to the emergency room as this may be a sign of worsening status from possible viral infection.  Otherwise, we will manage this as a viral syndrome. For sore throat or cough try using a honey-based tea. Use 3 teaspoons of honey with juice squeezed from half lemon. Place shaved pieces of ginger into 1/2-1 cup of water and warm over stove top. Then mix the ingredients and repeat every 4 hours as needed. Please take Tylenol 650mg  every 6 hours for aches and pains, fevers. Hydrate very well with at least 2 liters of water. Eat light meals such as soups to replenish electrolytes and soft fruits, veggies. Start an antihistamine like Zyrtec (10mg  daily) for postnasal drainage, sinus congestion.  You can take this together with cough medications as needed.   If you test positive for COVID-19, I do plan on sending prescriptions to your pharmacy for a COVID antiviral to prevent complications.  If you have not heard from [place] by noon tomorrow then please call our clinic and we will get you squared away.

## 2023-02-14 NOTE — ED Provider Notes (Signed)
[facility]  Note:  This document was prepared using Conservation officer, historic buildings and may include unintentional dictation errors.  MRN: [id number] DOB: [birth year]  Subjective:   Annette Deleon is a 47 y.o. female presenting for 3-day history of acute onset coughing, body pains, nausea without vomiting, postnasal drainage.  Has a remote history of asthma up until she had COVID last year.  This made her asthma restart this and actually caused an acute congestive heart failure episode.  She was restarted on breathing treatments by a pulmonologist.  Would like to make sure she does not have COVID or influenza.  Patient is not a smoker.  Regarding her blood pressure, patient has persistent uncontrolled hypertension.  Reports compliance with her medications.  No current facility-administered medications for this encounter.  Current Outpatient Medications:    acetaminophen (TYLENOL) 500 MG tablet, Take 500-1,000 mg by mouth every 6 (six) hours as needed for mild pain., Disp: , Rfl:    albuterol (PROVENTIL HFA;VENTOLIN HFA) 108 (90 Base) MCG/ACT inhaler, Inhale 1 puff into the lungs every 6 (six) hours as needed for wheezing or shortness of breath. , Disp: , Rfl:    atorvastatin (LIPITOR) 40 MG tablet, Take 1 tablet (40 mg total) by mouth daily at 6 PM., Disp: 30 tablet, Rfl: 0   atorvastatin (LIPITOR) 40 MG tablet, Take 1 tablet (40 mg total) by mouth daily., Disp: 90 tablet, Rfl: 3   Budesonide 90 MCG/ACT inhaler, Inhale 1 puff into the lungs every 6 (six) hours as needed (wheezing or shortness of breath)., Disp: , Rfl:    carvedilol (COREG) 25 MG tablet, Take 1 tablet (25 mg total) by mouth 2 (two) times daily., Disp: 60 tablet, Rfl: 10   cetirizine (ZYRTEC ALLERGY) 10 MG tablet, Take 1 tablet (10 mg total) by mouth at bedtime., Disp: 90 tablet, Rfl: 1   cetirizine (ZYRTEC) 10 MG tablet, Take 1 tablet (10 mg total) by mouth at bedtime., Disp: 90 tablet, Rfl: 1    cloNIDine (CATAPRES) 0.2 MG tablet, Take 0.1 mg by mouth daily as needed (if systolic Blood pressure is > 160)., Disp: , Rfl:    diltiazem (CARDIZEM CD) 360 MG 24 hr capsule, Take 360 mg by mouth daily., Disp: , Rfl:    diltiazem (CARDIZEM CD) 360 MG 24 hr capsule, Take 1 capsule (360 mg total) by mouth daily., Disp: 30 capsule, Rfl: 1   diltiazem (CARDIZEM CD) 360 MG 24 hr capsule, Take 1 capsule (360 mg total) by mouth daily., Disp: 30 capsule, Rfl: 1   fluticasone (FLONASE) 50 MCG/ACT nasal spray, Place 1 spray into both nostrils daily., Disp: 47.4 mL, Rfl: 1   Fluticasone-Salmeterol (AIRDUO RESPICLICK 232/14) 232-14 MCG/ACT AEPB, Inhale 1 puff into the lungs daily., Disp: 1 each, Rfl: 11   furosemide (LASIX) 20 MG tablet, Take 20 mg by mouth daily., Disp: , Rfl:    furosemide (LASIX) 40 MG tablet, Take 1 tablet (40 mg total) by mouth daily., Disp: 90 tablet, Rfl: 3   gabapentin (NEURONTIN) 300 MG capsule, Take 300 mg by mouth at bedtime as needed (pain)., Disp: , Rfl:    guaiFENesin (MUCINEX) 600 MG 12 hr tablet, Take 600 mg by mouth 2 (two) times daily as needed for cough or to loosen phlegm., Disp: , Rfl:    hydrochlorothiazide (HYDRODIURIL) 25 MG tablet, Take 25 mg by mouth daily., Disp: , Rfl:    hydrocortisone (ANUSOL-HC) 25 MG suppository, Place one suppository (25 mg dose) rectally  every 12 (twelve) hours for 10 days., Disp: 20 suppository, Rfl: 0   ipratropium (ATROVENT) 0.06 % nasal spray, Place 2 sprays into both nostrils 3 (three) times daily. As needed for nasal congestion, runny nose, Disp: 15 mL, Rfl: 1   irbesartan (AVAPRO) 150 MG tablet, Take 1 tablet (150 mg total) by mouth daily., Disp: 30 tablet, Rfl: 5   irbesartan (AVAPRO) 300 MG tablet, Take 1 tablet (300 mg total) by mouth daily., Disp: 30 tablet, Rfl: 1   losartan (COZAAR) 100 MG tablet, Take 100 mg by mouth daily., Disp: , Rfl:    losartan (COZAAR) 100 MG tablet, Take 1 tablet (100 mg total) by mouth daily., Disp: 90  tablet, Rfl: 0   ondansetron (ZOFRAN) 4 MG tablet, Take 2 tablets (8 mg total) by mouth 2 (two) times daily as needed., Disp: 20 tablet, Rfl: 1   ondansetron (ZOFRAN) 8 MG tablet, Take 1 tablet (8 mg total) by mouth every 8 (eight) hours as needed for nausea for up to 7 days., Disp: 20 tablet, Rfl: 0   Semaglutide-Weight Management (WEGOVY) 2.4 MG/0.75ML SOAJ, Inject 2.4 mg into the skin every 7 (seven) days., Disp: 9 mL, Rfl: 1   tirzepatide (ZEPBOUND) 2.5 MG/0.5ML Pen, Inject 2.5 mg into the skin once a week., Disp: 2 mL, Rfl: 0   tirzepatide (ZEPBOUND) 5 MG/0.5ML Pen, Inject 5 mg into the skin once a week., Disp: 2 mL, Rfl: 0   Cholecalciferol 1.25 MG (50000 UT) capsule, Take 1 capsule (50,000 Units total) by mouth once a week., Disp: 12 capsule, Rfl: 0   Allergies  Allergen Reactions   Imitrex [Sumatriptan Base] Anaphylaxis   Prednisone Palpitations   Wellbutrin [Bupropion] Anaphylaxis   Yellow Jacket Venom [Bee Venom] Hives   Ibuprofen Nausea And Vomiting   Penicillins Hives    Has patient had a PCN reaction causing immediate rash, facial/tongue/throat swelling, SOB or lightheadedness with hypotension: yes Has patient had a PCN reaction causing severe rash involving mucus membranes or skin necrosis: yes Has patient had a PCN reaction that required hospitalization: no Has patient had a PCN reaction occurring within the last 10 years: yes If all of the above answers are "NO", then may proceed with Cephalosporin use.    Past Medical History:  Diagnosis Date   Hypertension    Migraines    Obesity    Pseudotumor cerebri    Sleep apnea      Past Surgical History:  Procedure Laterality Date   ARTHROSCOPIC REPAIR ACL     CESAREAN SECTION     CHOLECYSTECTOMY     KNEE SURGERY     right   MYOMECTOMY      Family History  Problem Relation Age of Onset   Heart attack Father 18    Social History   Tobacco Use   Smoking status: Never   Smokeless tobacco: Never  Vaping Use    Vaping status: Never Used  Substance Use Topics   Alcohol use: Yes    Comment: occasionally   Drug use: No    ROS   Objective:   Vitals: BP (!) 182/115 (BP Location: Right Arm)   Pulse 100   Temp 99.5 F (37.5 C) (Oral)   Resp 20   LMP 01/29/2023   SpO2 98%   BP Readings from Last 3 Encounters:  02/14/23 (!) 182/115  05/05/22 (!) 178/103  03/28/22 (!) 162/103   Physical Exam Constitutional:      General: She is not in acute distress.  Appearance: Normal appearance. She is well-developed. She is not ill-appearing, toxic-appearing or diaphoretic.  HENT:     Head: Normocephalic and atraumatic.     Nose: Nose normal.     Mouth/Throat:     Mouth: Mucous membranes are moist.  Eyes:     General: No scleral icterus.       Right eye: No discharge.        Left eye: No discharge.     Extraocular Movements: Extraocular movements intact.  Cardiovascular:     Rate and Rhythm: Normal rate and regular rhythm.     Heart sounds: Normal heart sounds. No murmur heard.    No friction rub. No gallop.  Pulmonary:     Effort: Pulmonary effort is normal. No respiratory distress.     Breath sounds: No stridor. No wheezing, rhonchi or rales.  Chest:     Chest wall: No tenderness.  Skin:    General: Skin is warm and dry.  Neurological:     General: No focal deficit present.     Mental Status: She is alert and oriented to person, place, and time.  Psychiatric:        Mood and Affect: Mood normal.        Behavior: Behavior normal.     Assessment and Plan :   PDMP not reviewed this encounter.  1. Acute viral syndrome   2. Hypertensive urgency    Will defer imaging given clear pulmonary exam.  Will manage for viral illness such as viral URI, viral syndrome, viral rhinitis, COVID-19. Recommended supportive care. Offered scripts for symptomatic relief. Testing is pending. Counseled patient on potential for adverse effects with medications prescribed/recommended today, ER and  return-to-clinic precautions discussed, patient verbalized understanding.   Should patient test positive for COVID-19, I do recommend taking Paxlovid.  This has a slight interaction with diltiazem and that it may increase the serum concentration of the diltiazem but given the significant hypertension, this would not be an issue.  I would recommend discontinuing atorvastatin while on Paxlovid.   Wallis Bamberg, [place] 02/14/23 [phone]

## 2023-02-14 NOTE — Telephone Encounter (Signed)
Patient took her son to her pediatrician's office and he tested positive for COVID-19.  Patient is worried that she has COVID as well and is requesting Paxlovid.  She is to hold atorvastatin and air duo.  Can use albuterol.  Discussed the possibility of interactions with diltiazem but will maintain her on this blood pressure medication.  Prescription sent to her pharmacy of choice.

## 2023-02-15 LAB — SARS CORONAVIRUS 2 (TAT 6-24 HRS): SARS Coronavirus 2: POSITIVE — AB

## 2023-02-21 ENCOUNTER — Other Ambulatory Visit (HOSPITAL_COMMUNITY): Payer: Self-pay

## 2023-03-27 ENCOUNTER — Other Ambulatory Visit (HOSPITAL_BASED_OUTPATIENT_CLINIC_OR_DEPARTMENT_OTHER): Payer: Self-pay

## 2023-04-02 ENCOUNTER — Other Ambulatory Visit (HOSPITAL_BASED_OUTPATIENT_CLINIC_OR_DEPARTMENT_OTHER): Payer: Self-pay

## 2023-04-02 MED ORDER — INFLUENZA VIRUS VACC SPLIT PF (FLUZONE) 0.5 ML IM SUSY
0.5000 mL | PREFILLED_SYRINGE | Freq: Once | INTRAMUSCULAR | 0 refills | Status: AC
  Filled 2023-04-02: qty 0.5, 1d supply, fill #0

## 2023-04-04 ENCOUNTER — Other Ambulatory Visit (HOSPITAL_BASED_OUTPATIENT_CLINIC_OR_DEPARTMENT_OTHER): Payer: Self-pay

## 2023-04-04 ENCOUNTER — Emergency Department (HOSPITAL_BASED_OUTPATIENT_CLINIC_OR_DEPARTMENT_OTHER)
Admission: EM | Admit: 2023-04-04 | Discharge: 2023-04-04 | Disposition: A | Discharge status: Home / Self Care | Payer: Commercial Managed Care - PPO | Attending: Emergency Medicine | Admitting: Emergency Medicine

## 2023-04-04 ENCOUNTER — Emergency Department (HOSPITAL_BASED_OUTPATIENT_CLINIC_OR_DEPARTMENT_OTHER): Payer: Commercial Managed Care - PPO | Admitting: Radiology

## 2023-04-04 ENCOUNTER — Other Ambulatory Visit: Payer: Self-pay

## 2023-04-04 ENCOUNTER — Emergency Department (HOSPITAL_BASED_OUTPATIENT_CLINIC_OR_DEPARTMENT_OTHER): Payer: Commercial Managed Care - PPO

## 2023-04-04 ENCOUNTER — Encounter (HOSPITAL_BASED_OUTPATIENT_CLINIC_OR_DEPARTMENT_OTHER): Payer: Self-pay | Admitting: *Deleted

## 2023-04-04 DIAGNOSIS — Z79899 Other long term (current) drug therapy: Secondary | ICD-10-CM | POA: Diagnosis not present

## 2023-04-04 DIAGNOSIS — I5033 Acute on chronic diastolic (congestive) heart failure: Secondary | ICD-10-CM

## 2023-04-04 DIAGNOSIS — I700 Atherosclerosis of aorta: Secondary | ICD-10-CM | POA: Diagnosis not present

## 2023-04-04 DIAGNOSIS — Z20822 Contact with and (suspected) exposure to covid-19: Secondary | ICD-10-CM | POA: Insufficient documentation

## 2023-04-04 DIAGNOSIS — J189 Pneumonia, unspecified organism: Secondary | ICD-10-CM | POA: Diagnosis not present

## 2023-04-04 DIAGNOSIS — R0600 Dyspnea, unspecified: Secondary | ICD-10-CM

## 2023-04-04 DIAGNOSIS — I110 Hypertensive heart disease with heart failure: Secondary | ICD-10-CM | POA: Insufficient documentation

## 2023-04-04 DIAGNOSIS — R0602 Shortness of breath: Secondary | ICD-10-CM | POA: Diagnosis not present

## 2023-04-04 DIAGNOSIS — I509 Heart failure, unspecified: Secondary | ICD-10-CM | POA: Diagnosis not present

## 2023-04-04 DIAGNOSIS — R519 Headache, unspecified: Secondary | ICD-10-CM | POA: Insufficient documentation

## 2023-04-04 DIAGNOSIS — R918 Other nonspecific abnormal finding of lung field: Secondary | ICD-10-CM | POA: Diagnosis not present

## 2023-04-04 DIAGNOSIS — R7989 Other specified abnormal findings of blood chemistry: Secondary | ICD-10-CM | POA: Diagnosis not present

## 2023-04-04 HISTORY — DX: Heart failure, unspecified: I50.9

## 2023-04-04 LAB — CBC WITH DIFFERENTIAL/PLATELET
Abs Immature Granulocytes: 0.01 10*3/uL (ref 0.00–0.07)
Basophils Absolute: 0.1 10*3/uL (ref 0.0–0.1)
Basophils Relative: 1 %
Eosinophils Absolute: 0.2 10*3/uL (ref 0.0–0.5)
Eosinophils Relative: 3 %
HCT: 34 % — ABNORMAL LOW (ref 36.0–46.0)
Hemoglobin: 11.2 g/dL — ABNORMAL LOW (ref 12.0–15.0)
Immature Granulocytes: 0 %
Lymphocytes Relative: 27 %
Lymphs Abs: 2.1 10*3/uL (ref 0.7–4.0)
MCH: 25.7 pg — ABNORMAL LOW (ref 26.0–34.0)
MCHC: 32.9 g/dL (ref 30.0–36.0)
MCV: 78.2 fL — ABNORMAL LOW (ref 80.0–100.0)
Monocytes Absolute: 0.5 10*3/uL (ref 0.1–1.0)
Monocytes Relative: 7 %
Neutro Abs: 4.7 10*3/uL (ref 1.7–7.7)
Neutrophils Relative %: 62 %
Platelets: 486 10*3/uL — ABNORMAL HIGH (ref 150–400)
RBC: 4.35 MIL/uL (ref 3.87–5.11)
RDW: 15.3 % (ref 11.5–15.5)
WBC: 7.7 10*3/uL (ref 4.0–10.5)
nRBC: 0 % (ref 0.0–0.2)

## 2023-04-04 LAB — BASIC METABOLIC PANEL
Anion gap: 7 (ref 5–15)
BUN: 16 mg/dL (ref 6–20)
CO2: 27 mmol/L (ref 22–32)
Calcium: 9.3 mg/dL (ref 8.9–10.3)
Chloride: 104 mmol/L (ref 98–111)
Creatinine, Ser: 0.96 mg/dL (ref 0.44–1.00)
GFR, Estimated: >60 mL/min (ref >60)
Glucose, Bld: 103 mg/dL — ABNORMAL HIGH (ref 70–99)
Potassium: 3.8 mmol/L (ref 3.5–5.1)
Sodium: 138 mmol/L (ref 135–145)

## 2023-04-04 LAB — RESP PANEL BY RT-PCR (RSV, FLU A&B, COVID)  RVPGX2
Influenza A by PCR: NEGATIVE
Influenza B by PCR: NEGATIVE
Resp Syncytial Virus by PCR: NEGATIVE
SARS Coronavirus 2 by RT PCR: NEGATIVE

## 2023-04-04 LAB — TROPONIN I (HIGH SENSITIVITY)
Troponin I (High Sensitivity): 7 ng/L (ref <18)
Troponin I (High Sensitivity): 8 ng/L (ref <18)

## 2023-04-04 LAB — D-DIMER, QUANTITATIVE: D-Dimer, Quant: 0.41 ug{FEU}/mL (ref 0.00–0.50)

## 2023-04-04 LAB — BRAIN NATRIURETIC PEPTIDE: B Natriuretic Peptide: 195.8 pg/mL — ABNORMAL HIGH (ref 0.0–100.0)

## 2023-04-04 MED ORDER — LEVALBUTEROL TARTRATE 45 MCG/ACT IN AERO
1.0000 | INHALATION_SPRAY | RESPIRATORY_TRACT | 1 refills | Status: AC | PRN
  Filled 2023-04-04: qty 15, 17d supply, fill #0

## 2023-04-04 MED ORDER — ACETAMINOPHEN 325 MG PO TABS
650.0000 mg | ORAL_TABLET | Freq: Once | ORAL | Status: AC
  Administered 2023-04-04: 650 mg via ORAL
  Filled 2023-04-04: qty 2

## 2023-04-04 MED ORDER — ONDANSETRON HCL 4 MG/2ML IJ SOLN
4.0000 mg | Freq: Once | INTRAMUSCULAR | Status: AC
  Administered 2023-04-04: 4 mg via INTRAVENOUS
  Filled 2023-04-04: qty 2

## 2023-04-04 MED ORDER — ESCITALOPRAM OXALATE 10 MG PO TABS
10.0000 mg | ORAL_TABLET | Freq: Every day | ORAL | 1 refills | Status: AC
  Filled 2023-04-04: qty 30, 30d supply, fill #0

## 2023-04-04 MED ORDER — DOXYCYCLINE HYCLATE 100 MG PO CAPS
100.0000 mg | ORAL_CAPSULE | Freq: Two times a day (BID) | ORAL | 0 refills | Status: AC
  Filled 2023-04-04: qty 14, 7d supply, fill #0

## 2023-04-04 MED ORDER — FUROSEMIDE 10 MG/ML IJ SOLN
40.0000 mg | Freq: Once | INTRAMUSCULAR | Status: AC
  Administered 2023-04-04: 40 mg via INTRAVENOUS
  Filled 2023-04-04: qty 4

## 2023-04-04 MED ORDER — DILTIAZEM HCL ER COATED BEADS 120 MG PO CP24
360.0000 mg | ORAL_CAPSULE | Freq: Once | ORAL | Status: AC
  Administered 2023-04-04: 360 mg via ORAL
  Filled 2023-04-04: qty 3

## 2023-04-04 MED ORDER — IOHEXOL 350 MG/ML SOLN
100.0000 mL | Freq: Once | INTRAVENOUS | Status: AC | PRN
  Administered 2023-04-04: 85 mL via INTRAVENOUS

## 2023-04-04 MED ORDER — ASPIRIN 81 MG PO CHEW
324.0000 mg | CHEWABLE_TABLET | Freq: Once | ORAL | Status: AC
  Administered 2023-04-04: 324 mg via ORAL
  Filled 2023-04-04: qty 4

## 2023-04-04 MED ORDER — DEXTROSE 5 % IV SOLN
500.0000 mg | Freq: Once | INTRAVENOUS | Status: AC
  Administered 2023-04-04: 500 mg via INTRAVENOUS
  Filled 2023-04-04: qty 5

## 2023-04-04 MED ORDER — SODIUM CHLORIDE 0.9 % IV SOLN
1.0000 g | Freq: Once | INTRAVENOUS | Status: AC
  Administered 2023-04-04: 1 g via INTRAVENOUS
  Filled 2023-04-04: qty 10

## 2023-04-04 MED ORDER — CEFPODOXIME PROXETIL 200 MG PO TABS
200.0000 mg | ORAL_TABLET | Freq: Two times a day (BID) | ORAL | 0 refills | Status: AC
  Filled 2023-04-04: qty 14, 7d supply, fill #0

## 2023-04-04 MED ORDER — BUSPIRONE HCL 10 MG PO TABS
10.0000 mg | ORAL_TABLET | Freq: Two times a day (BID) | ORAL | 1 refills | Status: AC | PRN
  Filled 2023-04-04: qty 60, 30d supply, fill #0

## 2023-04-04 MED ORDER — CARVEDILOL 12.5 MG PO TABS
25.0000 mg | ORAL_TABLET | Freq: Once | ORAL | Status: AC
  Administered 2023-04-04: 25 mg via ORAL
  Filled 2023-04-04: qty 2

## 2023-04-04 NOTE — ED Notes (Addendum)
Patient presents to ED c/o shortness of breath and coughing up pink frothy sputum that started tonight after intercourse. Patient reports hx of CHF and is on lasix. Patient reports the shortness of breath is worse with exertion. +1 pitting edema noted to bilateral lower extremities.

## 2023-04-04 NOTE — ED Provider Notes (Signed)
  Physical Exam  BP (!) 143/90   Pulse 88   Temp 98.7 F (37.1 C) (Oral)   Resp (!) 32   Ht 5\' 2"  (1.575 m)   Wt 114.3 kg   LMP 03/24/2023 (Exact Date)   SpO2 100%   BMI 46.09 kg/m   Physical Exam Pulmonary:     Effort: Pulmonary effort is normal. No accessory muscle usage.     Breath sounds: No decreased breath sounds, wheezing, rhonchi or rales.     Procedures  Procedures  ED Course / MDM    Medical Decision Making Amount and/or Complexity of Data Reviewed Labs: ordered. Radiology: ordered. ECG/medicine tests: ordered.  Risk OTC drugs. Prescription drug management.   I, Rosana Berger, assumed care for this patient.  In brief 47 year old female, diagnosed with multifocal pneumonia.  Patient was signed out for finishing her IV antibiotics, reassessment.  Patient has had tachypnea here in the emergency department, however when I set the patient up and have her take deep breaths for auscultation, she is able to take slow deep breaths without any drops in her O2, has clear lung sounds.  She ambulates without any difficulty.  Return precautions discussed with the patient at bedside.  She has a strong preference for going home.  Will discharge patient.       Anders Simmonds T, DO 04/04/23 [phone]

## 2023-04-04 NOTE — ED Provider Notes (Signed)
[facility] AT [facility] Provider Note   CSN: [id number] Arrival date & time: 04/04/23  0251     History  Chief Complaint  Patient presents with   Shortness of Breath    Annette Deleon is a 47 y.o. female.  Patient with a history of hypertension, migraine headaches, CHF, sleep apnea, obesity, pseudotumor cerebri presenting with shortness of breath and coughing.  States she has been short of breath for the past 3 to 4 days but progressively worsening.  Became concerned tonight because she was coughing up pink frothy sputum which was new.  Breathing became worse while engaged in sexual intercourse this evening and she became very short of breath and started coughing up pink frothy mucus.  Denies chest pain.  Denies fever.  Denies any significant leg swelling.  Does have a history of asthma as well but denies any significant wheezing or fever. Reports this feels similar to when she had CHF with COVID several years ago.  She does take Lasix on a regular basis and states compliance.  EMS was called tonight and they assessed her and told her that her oxygen level was normal and her lung sounded clear but her blood pressure was high at 220/116 and they encouraged her to come to the ED. Patient reports compliance with her blood pressure medications.  Her headache is gradual in onset and similar to her previous migraines.  Denies thunderclap onset.  Denies sudden onset headache with sexual activity.  Denies this feeling similar to a pseudotumor type headache. Breathing is worse with lying flat and worse with exertion.  Has been compliant with her blood pressure medications which include Coreg, diltiazem, irbesartan, losartan and Lasix. Last echocardiogram 2 years ago showed diastolic dysfunction with an EF of 70%.  The history is provided by the patient and a relative.  Shortness of Breath Associated symptoms: cough   Associated symptoms: no abdominal pain, no  chest pain, no fever, no headaches, no rash, no sore throat and no vomiting        Home Medications Prior to Admission medications   Medication Sig Start Date End Date Taking? Authorizing Provider  acetaminophen (TYLENOL) 500 MG tablet Take 500-1,000 mg by mouth every 6 (six) hours as needed for mild pain.    [provider]  albuterol (VENTOLIN HFA) 108 (90 Base) MCG/ACT inhaler Inhale 1-2 puffs into the lungs every 6 (six) hours as needed for wheezing or shortness of breath. 02/14/23   Wallis Bamberg, PA-C  atorvastatin (LIPITOR) 40 MG tablet Take 1 tablet (40 mg total) by mouth daily at 6 PM. 06/13/18   Osvaldo Shipper, MD  atorvastatin (LIPITOR) 40 MG tablet Take 1 tablet (40 mg total) by mouth daily. 10/14/22     Budesonide 90 MCG/ACT inhaler Inhale 1 puff into the lungs every 6 (six) hours as needed (wheezing or shortness of breath). 12/08/19   [provider]  carvedilol (COREG) 25 MG tablet Take 1 tablet (25 mg total) by mouth 2 (two) times daily. 10/14/22     cetirizine (ZYRTEC ALLERGY) 10 MG tablet Take 1 tablet (10 mg total) by mouth at bedtime. 03/28/22 09/24/22  Theadora Rama Scales, PA-C  cetirizine (ZYRTEC) 10 MG tablet Take 1 tablet (10 mg total) by mouth at bedtime. 03/28/22     cloNIDine (CATAPRES) 0.2 MG tablet Take 0.1 mg by mouth daily as needed (if systolic Blood pressure is > 160). 06/30/19   [provider]  diltiazem (CARDIZEM CD) 360 MG  24 hr capsule Take 360 mg by mouth daily. 02/20/22   [provider]  diltiazem (CARDIZEM CD) 360 MG 24 hr capsule Take 1 capsule (360 mg total) by mouth daily. 06/06/22     diltiazem (CARDIZEM CD) 360 MG 24 hr capsule Take 1 capsule (360 mg total) by mouth daily. 02/19/22     fluticasone (FLONASE) 50 MCG/ACT nasal spray Place 1 spray into both nostrils daily. 03/28/22   Theadora Rama Scales, PA-C  Fluticasone-Salmeterol (AIRDUO RESPICLICK 232/14) 232-14 MCG/ACT AEPB Inhale 1 puff into the lungs daily. 10/18/20    Hunsucker, Lesia Sago, MD  furosemide (LASIX) 20 MG tablet Take 20 mg by mouth daily. 09/21/19   [provider]  furosemide (LASIX) 40 MG tablet Take 1 tablet (40 mg total) by mouth daily. 10/14/22     gabapentin (NEURONTIN) 300 MG capsule Take 300 mg by mouth at bedtime as needed (pain). 01/17/20   [provider]  guaiFENesin (MUCINEX) 600 MG 12 hr tablet Take 600 mg by mouth 2 (two) times daily as needed for cough or to loosen phlegm.    [provider]  hydrochlorothiazide (HYDRODIURIL) 25 MG tablet Take 25 mg by mouth daily.    [provider]  hydrocortisone (ANUSOL-HC) 25 MG suppository Place one suppository (25 mg dose) rectally every 12 (twelve) hours for 10 days. 08/21/22     ipratropium (ATROVENT) 0.06 % nasal spray Place 2 sprays into both nostrils 3 (three) times daily. As needed for nasal congestion, runny nose 03/28/22   Theadora Rama Scales, PA-C  irbesartan (AVAPRO) 150 MG tablet Take 1 tablet (150 mg total) by mouth daily. 08/14/22     irbesartan (AVAPRO) 300 MG tablet Take 1 tablet (300 mg total) by mouth daily. 09/27/22     losartan (COZAAR) 100 MG tablet Take 100 mg by mouth daily.    [provider]  losartan (COZAAR) 100 MG tablet Take 1 tablet (100 mg total) by mouth daily. 03/15/22     nirmatrelvir & ritonavir (PAXLOVID, 300/100,) 20 x 150 MG & 10 x 100MG  TBPK Take 2 tablets nirmtrelvir and 1 tablet ritonavir twice daily for 5 days 02/14/23   Wallis Bamberg, PA-C  ondansetron (ZOFRAN) 4 MG tablet Take 2 tablets (8 mg total) by mouth 2 (two) times daily as needed. 11/22/22     ondansetron (ZOFRAN) 8 MG tablet Take 1 tablet (8 mg total) by mouth every 8 (eight) hours as needed for nausea for up to 7 days. 08/14/22     promethazine-dextromethorphan (PROMETHAZINE-DM) 6.25-15 MG/5ML syrup Take 5 mLs by mouth 3 (three) times daily as needed for cough. 02/14/23   Wallis Bamberg, PA-C  Semaglutide-Weight Management (WEGOVY) 2.4 MG/0.75ML SOAJ Inject 2.4  mg into the skin every 7 (seven) days. 09/13/22     tirzepatide (ZEPBOUND) 2.5 MG/0.5ML Pen Inject 2.5 mg into the skin once a week. 11/21/22     tirzepatide (ZEPBOUND) 5 MG/0.5ML Pen Inject 5 mg into the skin once a week. 11/22/22     Cholecalciferol 1.25 MG (50000 UT) capsule Take 1 capsule (50,000 Units total) by mouth once a week. 05/15/22         Allergies    Imitrex [sumatriptan base], Prednisone, Wellbutrin [bupropion], Yellow jacket venom [bee venom], Ibuprofen, and Penicillins    Review of Systems   Review of Systems  Constitutional:  Positive for activity change. Negative for appetite change and fever.  HENT:  Negative for congestion, rhinorrhea and sore throat.   Respiratory:  Positive for  cough and shortness of breath. Negative for chest tightness.   Cardiovascular:  Positive for leg swelling. Negative for chest pain.  Gastrointestinal:  Negative for abdominal pain, nausea and vomiting.  Genitourinary:  Negative for dysuria.  Musculoskeletal:  Negative for arthralgias and myalgias.  Skin:  Negative for rash.  Neurological:  Negative for weakness and headaches.   all other systems are negative except as noted in the HPI and PMH.    Physical Exam Updated Vital Signs BP (!) 185/101   Pulse 94   Temp 98.4 F (36.9 C)   Resp (!) 21   Ht 5\' 2"  (1.575 m)   Wt 114.3 kg   LMP 03/24/2023 (Exact Date)   SpO2 100%   BMI 46.09 kg/m  Physical Exam Vitals and nursing note reviewed.  Constitutional:      General: She is not in acute distress.    Appearance: She is well-developed. She is obese.     Comments: Dyspneic with conversation  HENT:     Head: Normocephalic and atraumatic.     Mouth/Throat:     Pharynx: No oropharyngeal exudate.  Eyes:     Conjunctiva/sclera: Conjunctivae normal.     Pupils: Pupils are equal, round, and reactive to light.  Neck:     Comments: No meningismus. Cardiovascular:     Rate and Rhythm: Normal rate and regular rhythm.     Heart sounds:  Normal heart sounds. No murmur heard. Pulmonary:     Effort: Pulmonary effort is normal. No respiratory distress.     Breath sounds: Rales present.  Abdominal:     Palpations: Abdomen is soft.     Tenderness: There is no abdominal tenderness. There is no guarding or rebound.  Musculoskeletal:        General: No tenderness. Normal range of motion.     Cervical back: Normal range of motion and neck supple.     Right lower leg: Edema present.     Left lower leg: Edema present.  Skin:    General: Skin is warm.  Neurological:     Mental Status: She is alert and oriented to person, place, and time.     Cranial Nerves: No cranial nerve deficit.     Motor: No abnormal muscle tone.     Coordination: Coordination normal.     Comments:  5/5 strength throughout. CN 2-12 intact.Equal grip strength.   Psychiatric:        Behavior: Behavior normal.     ED Results / Procedures / Treatments   Labs (all labs ordered are listed, but only abnormal results are displayed) Labs Reviewed  CBC WITH DIFFERENTIAL/PLATELET - Abnormal; Notable for the following components:      Result Value   Hemoglobin 11.2 (*)    HCT 34.0 (*)    MCV 78.2 (*)    MCH 25.7 (*)    Platelets 486 (*)    All other components within normal limits  BASIC METABOLIC PANEL - Abnormal; Notable for the following components:   Glucose, Bld 103 (*)    All other components within normal limits  BRAIN NATRIURETIC PEPTIDE - Abnormal; Notable for the following components:   B Natriuretic Peptide 195.8 (*)    All other components within normal limits  RESP PANEL BY RT-PCR (RSV, FLU A&B, COVID)  [id number]  D-DIMER, QUANTITATIVE  TROPONIN I (HIGH SENSITIVITY)  TROPONIN I (HIGH SENSITIVITY)    EKG EKG Interpretation Date/Time:  Friday April 04 2023 03:12:36 EDT Ventricular Rate:  94 PR  Interval:  172 QRS Duration:  86 QT Interval:  364 QTC Calculation: 455 R Axis:   10  Text Interpretation: Normal sinus rhythm Possible  Anterior infarct (cited on or before 10-Nov-2020) Abnormal ECG When compared with ECG of 05-May-2022 17:23, QRS axis Shifted right No significant change was found Confirmed by Glynn Octave [phone]) on 04/04/2023 3:26:23 AM  Radiology CT Angio Chest PE W and/or Wo Contrast  Result Date: 04/04/2023 CLINICAL DATA:  Shortness of breath.  Positive D-dimer. EXAM: CT ANGIOGRAPHY CHEST WITH CONTRAST TECHNIQUE: Multidetector CT imaging of the chest was performed using the standard protocol during bolus administration of intravenous contrast. Multiplanar CT image reconstructions and MIPs were obtained to evaluate the vascular anatomy. RADIATION DOSE REDUCTION: This exam was performed according to the departmental dose-optimization program which includes automated exposure control, adjustment of the mA and/or kV according to patient size and/or use of iterative reconstruction technique. CONTRAST:  85mL OMNIPAQUE IOHEXOL 350 MG/ML SOLN COMPARISON:  07/03/2020 FINDINGS: Cardiovascular: Heart size mildly enlarged. No substantial pericardial effusion. Mild atherosclerotic calcification is noted in the wall of the thoracic aorta. There is no filling defect within the opacified pulmonary arteries to suggest the presence of an acute pulmonary embolus. Mediastinum/Nodes: No mediastinal lymphadenopathy. There is no hilar lymphadenopathy. The esophagus has normal imaging features. There is no axillary lymphadenopathy. Lungs/Pleura: Patchy ground-glass opacity is seen in the right upper and lower lobes. There is some minimal ground-glass opacity in the posterior right middle lobe. Atelectasis noted dependent left lung. No pleural effusion. Upper Abdomen: Unremarkable. Musculoskeletal: No worrisome lytic or sclerotic osseous abnormality. Review of the MIP images confirms the above findings. IMPRESSION: 1. No CT evidence for acute pulmonary embolus. 2. Patchy ground-glass opacity in the right upper and lower lobes with some minimal  ground-glass opacity in the posterior right middle lobe. Imaging features are compatible with multifocal pneumonia. 3.  Aortic Atherosclerosis (ICD10-I70.0). Electronically Signed   By: Kennith Center M.D.   On: 04/04/2023 06:11   CT Head Wo Contrast  Result Date: 04/04/2023 CLINICAL DATA:  Headache with increased frequency. EXAM: CT HEAD WITHOUT CONTRAST TECHNIQUE: Contiguous axial images were obtained from the base of the skull through the vertex without intravenous contrast. RADIATION DOSE REDUCTION: This exam was performed according to the departmental dose-optimization program which includes automated exposure control, adjustment of the mA and/or kV according to patient size and/or use of iterative reconstruction technique. COMPARISON:  07/23/2021 FINDINGS: Brain: No evidence of acute infarction, hemorrhage, hydrocephalus, extra-axial collection or mass lesion/mass effect. Vascular: No hyperdense vessel or unexpected calcification. Skull: Normal. Negative for fracture or focal lesion. Sinuses/Orbits: No acute finding. IMPRESSION: Normal head CT. Electronically Signed   By: Tiburcio Pea M.D.   On: 04/04/2023 05:28   DG Chest 2 View  Result Date: 04/04/2023 CLINICAL DATA:  Shortness of breath EXAM: CHEST - 2 VIEW COMPARISON:  11/10/2020 FINDINGS: The heart size and mediastinal contours are within normal limits. Both lungs are clear. The visualized skeletal structures are unremarkable. IMPRESSION: No active cardiopulmonary disease. Electronically Signed   By: Jasmine Pang M.D.   On: 04/04/2023 04:08    Procedures Procedures    Medications Ordered in ED Medications  aspirin chewable tablet 324 mg (has no administration in time range)    ED Course/ Medical Decision Making/ A&P                                 Medical Decision  Making Amount and/or Complexity of Data Reviewed Labs: ordered. Decision-making details documented in ED Course. Radiology: ordered and independent interpretation  performed. Decision-making details documented in ED Course. ECG/medicine tests: ordered and independent interpretation performed. Decision-making details documented in ED Course.  Risk OTC drugs. Prescription drug management.   3 days of progressively worsening shortness of breath now with pink frothy sputum production.  No chest pain.  On arrival patient is tachypneic but in no respiratory distress with crackles at the bases.  O2 saturation 100% on room air.  Gradual onset headache as well after coughing typical of her usual migraines. No sudden onset headache during exertion or during intercourse. Low suspicion for SAH, temporal arteritis, meningitis.   EKG without acute ischemia. CXR is negative for edema or infiltrate. Results reviewed and interpreted by me.  IV lasix given with some concern for diastolic heart failure.  Labs reassuring. BNP 195. D-dimer negative.  Troponin negative x2.  Patient feels improved, able to ambulate without desaturation.   CT is negative for pulmonary embolism but does show patchy right-sided airspace disease concerning for pneumonia.  She does have a cough. CT head is negative.   Troponin negative x 2.  Low suspicion for ACS.  She is able to ambulate without desaturation.  Will treat for suspected pneumonia with possible diastolic heart failure exacerbation.  Blood pressure has improved.  Compliance with her medications.  Admit versus discharge discussed with patient.  She would prefer to go home.  She agrees to take the antibiotics as prescribed and double her Lasix dose for the next 2 days.  She has cardiology follow-up on November 14.  Discussed return to the ED sooner with exertional chest pain, pain associate with shortness of breath, nausea, vomiting, diaphoresis, intractable pain or other concerns.  On Attempted discharge, patient still tachypneic in the 35-40 range.  She denies feeling short of breath and is not hypoxic with rest or ambulation.   She has no wheezing on exam or significant rales.  She is still receiving her IV antibiotics.  Will give her morning blood pressure medications.  Will need reassessment prior to discharge to see if her tachypnea improves. Care transferred to Dr. Andria Meuse at shift change.        Final Clinical Impression(s) / ED Diagnoses Final diagnoses:  Community acquired pneumonia of right lung, unspecified part of lung  Dyspnea, unspecified type    Rx / DC Orders ED Discharge Orders     None         Renan Danese, Jeannett Senior, MD 04/04/23 [phone]

## 2023-04-04 NOTE — Discharge Instructions (Addendum)
You declined admission to the hospital today. Take the antibiotics as prescribed for pneumonia. Take your lasix 40 mg twice daily for two days and then go back to 40 mg daily. Followup with your doctor for a recheck next week. You should have an updated echocardiogram by your PCP or cardiologist. Return to the ED with exertional chest pain, pain associated with shortness of breath, vomiting, sweating or any other concerns.

## 2023-04-04 NOTE — ED Notes (Signed)
Patient transported to XR via stretcher with tech.

## 2023-04-04 NOTE — ED Notes (Signed)
Patient ambulated with steady gait while obtaining walking O2 saturation. Patient's O2 saturation remained 96%. Provider notified.

## 2023-04-04 NOTE — ED Triage Notes (Addendum)
C/o of feeling sob and a headache. States sob started 3-4 days ago.  Concerned because she had pink frothy sputum tonight. States she called EMS and they encouraged pt to come here to be seen due to her b/p being high 220/116. Denies any fevers. States she had a dry cough prior to tonight. Sats 99% on RA. Has not taken any meds pta. C/o lower leg swelling bilateral. No distress on arrival.

## 2023-04-04 NOTE — ED Notes (Signed)
Patient c/o nausea. Provider notified

## 2023-04-07 ENCOUNTER — Other Ambulatory Visit: Payer: Self-pay

## 2023-04-11 ENCOUNTER — Other Ambulatory Visit (HOSPITAL_BASED_OUTPATIENT_CLINIC_OR_DEPARTMENT_OTHER): Payer: Self-pay

## 2023-04-11 ENCOUNTER — Other Ambulatory Visit (HOSPITAL_COMMUNITY): Payer: Self-pay

## 2023-04-11 ENCOUNTER — Other Ambulatory Visit: Payer: Self-pay

## 2023-04-11 DIAGNOSIS — I517 Cardiomegaly: Secondary | ICD-10-CM | POA: Diagnosis not present

## 2023-04-11 DIAGNOSIS — I10 Essential (primary) hypertension: Secondary | ICD-10-CM | POA: Diagnosis not present

## 2023-04-11 MED ORDER — ENTRESTO 97-103 MG PO TABS
1.0000 | ORAL_TABLET | Freq: Two times a day (BID) | ORAL | 3 refills | Status: AC
  Filled 2023-04-11 – 2023-04-26 (×2): qty 180, 90d supply, fill #0
  Filled 2023-05-09: qty 60, 30d supply, fill #0

## 2023-04-14 ENCOUNTER — Other Ambulatory Visit (HOSPITAL_BASED_OUTPATIENT_CLINIC_OR_DEPARTMENT_OTHER): Payer: Self-pay

## 2023-04-14 MED ORDER — ZEPBOUND 7.5 MG/0.5ML ~~LOC~~ SOAJ
7.5000 mg | SUBCUTANEOUS | 1 refills | Status: DC
  Filled 2023-04-14 – 2023-04-26 (×2): qty 2, 28d supply, fill #0
  Filled 2023-05-24: qty 2, 28d supply, fill #1

## 2023-04-16 ENCOUNTER — Other Ambulatory Visit (HOSPITAL_BASED_OUTPATIENT_CLINIC_OR_DEPARTMENT_OTHER): Payer: Self-pay

## 2023-04-22 ENCOUNTER — Other Ambulatory Visit (HOSPITAL_BASED_OUTPATIENT_CLINIC_OR_DEPARTMENT_OTHER): Payer: Self-pay

## 2023-04-24 ENCOUNTER — Other Ambulatory Visit (HOSPITAL_BASED_OUTPATIENT_CLINIC_OR_DEPARTMENT_OTHER): Payer: Self-pay

## 2023-04-26 ENCOUNTER — Other Ambulatory Visit (HOSPITAL_BASED_OUTPATIENT_CLINIC_OR_DEPARTMENT_OTHER): Payer: Self-pay

## 2023-05-09 ENCOUNTER — Other Ambulatory Visit (HOSPITAL_BASED_OUTPATIENT_CLINIC_OR_DEPARTMENT_OTHER): Payer: Self-pay

## 2023-05-23 ENCOUNTER — Encounter (HOSPITAL_BASED_OUTPATIENT_CLINIC_OR_DEPARTMENT_OTHER): Payer: Self-pay

## 2023-05-23 ENCOUNTER — Emergency Department (HOSPITAL_BASED_OUTPATIENT_CLINIC_OR_DEPARTMENT_OTHER)
Admission: EM | Admit: 2023-05-23 | Discharge: 2023-05-23 | Disposition: A | Discharge status: Home / Self Care | Payer: Commercial Managed Care - PPO | Attending: Emergency Medicine | Admitting: Emergency Medicine

## 2023-05-23 ENCOUNTER — Emergency Department (HOSPITAL_BASED_OUTPATIENT_CLINIC_OR_DEPARTMENT_OTHER): Payer: Commercial Managed Care - PPO

## 2023-05-23 ENCOUNTER — Other Ambulatory Visit: Payer: Self-pay

## 2023-05-23 DIAGNOSIS — Z79899 Other long term (current) drug therapy: Secondary | ICD-10-CM | POA: Insufficient documentation

## 2023-05-23 DIAGNOSIS — M79675 Pain in left toe(s): Secondary | ICD-10-CM | POA: Diagnosis not present

## 2023-05-23 DIAGNOSIS — I10 Essential (primary) hypertension: Secondary | ICD-10-CM | POA: Diagnosis not present

## 2023-05-23 DIAGNOSIS — M109 Gout, unspecified: Secondary | ICD-10-CM | POA: Diagnosis not present

## 2023-05-23 DIAGNOSIS — M10072 Idiopathic gout, left ankle and foot: Secondary | ICD-10-CM | POA: Diagnosis not present

## 2023-05-23 DIAGNOSIS — M79672 Pain in left foot: Secondary | ICD-10-CM | POA: Diagnosis present

## 2023-05-23 DIAGNOSIS — I509 Heart failure, unspecified: Secondary | ICD-10-CM | POA: Insufficient documentation

## 2023-05-23 DIAGNOSIS — I110 Hypertensive heart disease with heart failure: Secondary | ICD-10-CM | POA: Insufficient documentation

## 2023-05-23 MED ORDER — DEXAMETHASONE SODIUM PHOSPHATE 10 MG/ML IJ SOLN
10.0000 mg | Freq: Once | INTRAMUSCULAR | Status: AC
  Administered 2023-05-23: 10 mg via INTRAMUSCULAR
  Filled 2023-05-23: qty 1

## 2023-05-23 MED ORDER — HYDROCODONE-ACETAMINOPHEN 5-325 MG PO TABS: 1.0000 | ORAL_TABLET | ORAL | 0 refills | Status: AC | PRN

## 2023-05-23 MED ORDER — MORPHINE SULFATE (PF) 4 MG/ML IV SOLN
6.0000 mg | Freq: Once | INTRAVENOUS | Status: AC
  Administered 2023-05-23: 6 mg via INTRAMUSCULAR
  Filled 2023-05-23: qty 2

## 2023-05-23 MED ORDER — HYDROMORPHONE HCL 1 MG/ML IJ SOLN
1.0000 mg | Freq: Once | INTRAMUSCULAR | Status: AC
  Administered 2023-05-23: 1 mg via INTRAMUSCULAR
  Filled 2023-05-23: qty 1

## 2023-05-23 NOTE — Discharge Instructions (Addendum)
Follow-up with your podiatrist as scheduled.  Return to the emergency room if you have any worsening symptoms.  Take your blood pressure medicine when you get home.  Monitor your blood pressure at home.  Follow-up with your primary care doctor if it remains elevated.

## 2023-05-23 NOTE — ED Notes (Signed)
PT c/o of foot pain 10/10 ache in nature with NO relief following med admin.

## 2023-05-23 NOTE — ED Provider Notes (Signed)
[place] EMERGENCY DEPARTMENT AT [facility] Provider Note   CSN: [id number] Arrival date & time: 05/23/23  1649     History  Chief Complaint  Patient presents with   Foot Pain    L   Gout    Annette Deleon is a 47 y.o. female.  Patient is a 47 year old female with history of hypertension, pseudotumor cerebri, CHF who presents with pain in her left big toe.  She does have a prior history of gout and says that she has had flareups of gout in the same toe.  However usually it comes on a little bit slower but this time it came on pretty fast.  She started having pain this morning and it got progressively worse through the day.  She denies any fevers.  She denies any injuries to the toe.  She has been working the last 5 days, 12-hour shifts.       Home Medications Prior to Admission medications   Medication Sig Start Date End Date Taking? Authorizing Provider  HYDROcodone-acetaminophen (NORCO/VICODIN) 5-325 MG tablet Take 1-2 tablets by mouth every 4 (four) hours as needed. 05/23/23  Yes Rolan Bucco, MD  acetaminophen (TYLENOL) 500 MG tablet Take 500-1,000 mg by mouth every 6 (six) hours as needed for mild pain.    [provider]  albuterol (VENTOLIN HFA) 108 (90 Base) MCG/ACT inhaler Inhale 1-2 puffs into the lungs every 6 (six) hours as needed for wheezing or shortness of breath. 02/14/23   Wallis Bamberg, PA-C  atorvastatin (LIPITOR) 40 MG tablet Take 1 tablet (40 mg total) by mouth daily at 6 PM. 06/13/18   Osvaldo Shipper, MD  atorvastatin (LIPITOR) 40 MG tablet Take 1 tablet (40 mg total) by mouth daily. 10/14/22     Budesonide 90 MCG/ACT inhaler Inhale 1 puff into the lungs every 6 (six) hours as needed (wheezing or shortness of breath). 12/08/19   [provider]  busPIRone (BUSPAR) 10 MG tablet Take 1 tablet (10 mg total) by mouth 2 (two) times daily as needed. 04/04/23     carvedilol (COREG) 25 MG tablet Take 1 tablet (25 mg total) by mouth 2 (two)  times daily. 10/14/22     cetirizine (ZYRTEC ALLERGY) 10 MG tablet Take 1 tablet (10 mg total) by mouth at bedtime. 03/28/22 09/24/22  Theadora Rama Scales, PA-C  cetirizine (ZYRTEC) 10 MG tablet Take 1 tablet (10 mg total) by mouth at bedtime. 03/28/22     cloNIDine (CATAPRES) 0.2 MG tablet Take 0.1 mg by mouth daily as needed (if systolic Blood pressure is > 160). 06/30/19   [provider]  diltiazem (CARDIZEM CD) 360 MG 24 hr capsule Take 360 mg by mouth daily. 02/20/22   [provider]  diltiazem (CARDIZEM CD) 360 MG 24 hr capsule Take 1 capsule (360 mg total) by mouth daily. 06/06/22     diltiazem (CARDIZEM CD) 360 MG 24 hr capsule Take 1 capsule (360 mg total) by mouth daily. 02/19/22     doxycycline (VIBRAMYCIN) 100 MG capsule Take 1 capsule (100 mg total) by mouth 2 (two) times daily. 04/04/23   Rancour, Jeannett Senior, MD  escitalopram (LEXAPRO) 10 MG tablet Take 1 tablet (10 mg total) by mouth daily. 04/04/23     fluticasone (FLONASE) 50 MCG/ACT nasal spray Place 1 spray into both nostrils daily. 03/28/22   Theadora Rama Scales, PA-C  Fluticasone-Salmeterol (AIRDUO RESPICLICK 232/14) 232-14 MCG/ACT AEPB Inhale 1 puff into the lungs daily. 10/18/20   Hunsucker, Lesia Sago, MD  furosemide (LASIX) 20 MG tablet Take 20 mg by mouth daily. 09/21/19   [provider]  furosemide (LASIX) 40 MG tablet Take 1 tablet (40 mg total) by mouth daily. 10/14/22     gabapentin (NEURONTIN) 300 MG capsule Take 300 mg by mouth at bedtime as needed (pain). 01/17/20   [provider]  guaiFENesin (MUCINEX) 600 MG 12 hr tablet Take 600 mg by mouth 2 (two) times daily as needed for cough or to loosen phlegm.    [provider]  hydrochlorothiazide (HYDRODIURIL) 25 MG tablet Take 25 mg by mouth daily.    [provider]  hydrocortisone (ANUSOL-HC) 25 MG suppository Place one suppository (25 mg dose) rectally every 12 (twelve) hours for 10 days. 08/21/22     ipratropium  (ATROVENT) 0.06 % nasal spray Place 2 sprays into both nostrils 3 (three) times daily. As needed for nasal congestion, runny nose 03/28/22   Theadora Rama Scales, PA-C  irbesartan (AVAPRO) 150 MG tablet Take 1 tablet (150 mg total) by mouth daily. 08/14/22     irbesartan (AVAPRO) 300 MG tablet Take 1 tablet (300 mg total) by mouth daily. 09/27/22     levalbuterol (XOPENEX HFA) 45 MCG/ACT inhaler Inhale 1-2 puffs into the lungs every 4 (four) hours as needed for wheezing 04/04/23     losartan (COZAAR) 100 MG tablet Take 100 mg by mouth daily.    [provider]  losartan (COZAAR) 100 MG tablet Take 1 tablet (100 mg total) by mouth daily. 03/15/22     nirmatrelvir & ritonavir (PAXLOVID, 300/100,) 20 x 150 MG & 10 x 100MG  TBPK Take 2 tablets nirmtrelvir and 1 tablet ritonavir twice daily for 5 days 02/14/23   Wallis Bamberg, PA-C  ondansetron (ZOFRAN) 4 MG tablet Take 2 tablets (8 mg total) by mouth 2 (two) times daily as needed. 11/22/22     ondansetron (ZOFRAN) 8 MG tablet Take 1 tablet (8 mg total) by mouth every 8 (eight) hours as needed for nausea for up to 7 days. 08/14/22     promethazine-dextromethorphan (PROMETHAZINE-DM) 6.25-15 MG/5ML syrup Take 5 mLs by mouth 3 (three) times daily as needed for cough. 02/14/23   Wallis Bamberg, PA-C  sacubitril-valsartan (ENTRESTO) 97-103 MG Take 1 tablet by mouth 2 (two) times daily. 04/11/23     Semaglutide-Weight Management (WEGOVY) 2.4 MG/0.75ML SOAJ Inject 2.4 mg into the skin every 7 (seven) days. 09/13/22     tirzepatide (ZEPBOUND) 2.5 MG/0.5ML Pen Inject 2.5 mg into the skin once a week. 11/21/22     tirzepatide (ZEPBOUND) 5 MG/0.5ML Pen Inject 5 mg into the skin once a week. 11/22/22     tirzepatide (ZEPBOUND) 7.5 MG/0.5ML Pen Inject 7.5 mg into the skin every 7 (seven) days. 04/14/23     Cholecalciferol 1.25 MG (50000 UT) capsule Take 1 capsule (50,000 Units total) by mouth once a week. 05/15/22         Allergies    Imitrex [sumatriptan base],  Prednisone, Wellbutrin [bupropion], Yellow jacket venom [bee venom], Ibuprofen, and Penicillins    Review of Systems   Review of Systems  Constitutional:  Negative for fever.  Gastrointestinal:  Negative for nausea and vomiting.  Musculoskeletal:  Positive for arthralgias and joint swelling. Negative for back pain and neck pain.  Skin:  Negative for wound.  Neurological:  Negative for weakness, numbness and headaches.    Physical Exam Updated Vital Signs BP (!) 205/108 (BP Location: Right Arm)   Pulse 79   Temp 98 F (  36.7 C) (Oral)   Resp 18   SpO2 100%  Physical Exam Constitutional:      Appearance: She is well-developed.  HENT:     Head: Normocephalic and atraumatic.  Cardiovascular:     Rate and Rhythm: Normal rate.  Pulmonary:     Effort: Pulmonary effort is normal.  Musculoskeletal:        General: Tenderness present.     Cervical back: Normal range of motion and neck supple.     Comments: Mild erythema and swelling to the base of the left big toe.  She has generalized tenderness to the toe.  No wounds.  No pain to the remainder of the foot.  Pedal pulses are intact.  Skin:    General: Skin is warm and dry.  Neurological:     Mental Status: She is alert and oriented to person, place, and time.     ED Results / Procedures / Treatments   Labs (all labs ordered are listed, but only abnormal results are displayed) Labs Reviewed - No data to display  EKG None  Radiology DG Foot Complete Left Result Date: 05/23/2023 CLINICAL DATA:  Pain in the big toe EXAM: LEFT FOOT - COMPLETE 3+ VIEW COMPARISON:  None Available. FINDINGS: No fracture or malalignment.  Soft tissues are unremarkable. IMPRESSION: No acute osseous abnormality Electronically Signed   By: Jasmine Pang M.D.   On: 05/23/2023 19:16    Procedures Procedures    Medications Ordered in ED Medications  morphine (PF) 4 MG/ML injection 6 mg (6 mg Intramuscular Given 05/23/23 1732)  HYDROmorphone  (DILAUDID) injection 1 mg (1 mg Intramuscular Given 05/23/23 2040)  dexamethasone (DECADRON) injection 10 mg (10 mg Intramuscular Given 05/23/23 2040)    ED Course/ Medical Decision Making/ A&P                                 Medical Decision Making Amount and/or Complexity of Data Reviewed Radiology: ordered.  Risk Prescription drug management.   Patient is a 48 year old who presents with pain and swelling in her left big toe.  She has had prior similar gout flareups.  She is very tender in the toe.  It is mildly inflamed.  X-rays were obtained which show no acute abnormalities.  This was interpreted by me and confirmed by the radiologist.  No significant joint effusion is appreciated.  I have a low suspicion of septic arthritis.  She was given a dose of Decadron here in the ED as well as pain medication.  She is feeling better after this.  She was discharged home in good condition.  She was given a prescription for a short course of hydrocodone.  She was encouraged to also use Voltaren gel to the area.  She has an appointment to follow-up with her podiatrist.  Return precautions were given.  Of note her blood pressure has been markedly elevated.  On recheck after better pain management, it was 186/100.  She is currently otherwise asymptomatic.  She did not take her blood pressure medication and says she will take it when she gets home.  I encouraged her to keep an eye on her blood pressures over the next few days and if it remains elevated, she will need to follow-up with her primary care doctor.  Final Clinical Impression(s) / ED Diagnoses Final diagnoses:  Acute gout involving toe of left foot, unspecified cause  Elevated blood pressure reading with diagnosis  of hypertension    Rx / DC Orders ED Discharge Orders          Ordered    HYDROcodone-acetaminophen (NORCO/VICODIN) 5-325 MG tablet  Every 4 hours PRN        05/23/23 2235              Rolan Bucco, MD 05/23/23  2239

## 2023-05-23 NOTE — ED Notes (Signed)
Report given to the next RN.Marland KitchenMarland Kitchen

## 2023-05-23 NOTE — ED Triage Notes (Signed)
Pt c/o "gout flare" onset this AM, in L foot. Hx of same, in same foot. Denies injury to foot, denies other known cause of pain.

## 2023-05-24 ENCOUNTER — Other Ambulatory Visit (HOSPITAL_BASED_OUTPATIENT_CLINIC_OR_DEPARTMENT_OTHER): Payer: Self-pay

## 2023-06-16 ENCOUNTER — Other Ambulatory Visit: Payer: Self-pay

## 2023-06-16 ENCOUNTER — Other Ambulatory Visit (HOSPITAL_BASED_OUTPATIENT_CLINIC_OR_DEPARTMENT_OTHER): Payer: Self-pay

## 2023-06-16 DIAGNOSIS — E66813 Obesity, class 3: Secondary | ICD-10-CM | POA: Diagnosis not present

## 2023-06-16 DIAGNOSIS — Z6841 Body Mass Index (BMI) 40.0 and over, adult: Secondary | ICD-10-CM | POA: Diagnosis not present

## 2023-06-16 DIAGNOSIS — Z1331 Encounter for screening for depression: Secondary | ICD-10-CM | POA: Diagnosis not present

## 2023-06-16 DIAGNOSIS — G4733 Obstructive sleep apnea (adult) (pediatric): Secondary | ICD-10-CM | POA: Diagnosis not present

## 2023-06-16 DIAGNOSIS — E559 Vitamin D deficiency, unspecified: Secondary | ICD-10-CM | POA: Diagnosis not present

## 2023-06-16 DIAGNOSIS — I10 Essential (primary) hypertension: Secondary | ICD-10-CM | POA: Diagnosis not present

## 2023-06-16 DIAGNOSIS — E7800 Pure hypercholesterolemia, unspecified: Secondary | ICD-10-CM | POA: Diagnosis not present

## 2023-06-16 DIAGNOSIS — R7303 Prediabetes: Secondary | ICD-10-CM | POA: Diagnosis not present

## 2023-06-16 DIAGNOSIS — I517 Cardiomegaly: Secondary | ICD-10-CM | POA: Diagnosis not present

## 2023-06-16 DIAGNOSIS — E611 Iron deficiency: Secondary | ICD-10-CM | POA: Diagnosis not present

## 2023-06-16 MED ORDER — ZEPBOUND 10 MG/0.5ML ~~LOC~~ SOAJ
10.0000 mg | SUBCUTANEOUS | 0 refills | Status: AC
  Filled 2023-06-16: qty 2, 28d supply, fill #0
  Filled 2023-06-30: qty 2, 28d supply, fill #1
  Filled 2023-08-08: qty 2, 28d supply, fill #2

## 2023-06-16 MED ORDER — CARVEDILOL 25 MG PO TABS
25.0000 mg | ORAL_TABLET | Freq: Two times a day (BID) | ORAL | 1 refills | Status: AC
  Filled 2023-06-16: qty 180, 90d supply, fill #0

## 2023-06-16 MED ORDER — DILTIAZEM HCL ER COATED BEADS 360 MG PO CP24
360.0000 mg | ORAL_CAPSULE | Freq: Every day | ORAL | 1 refills | Status: AC
  Filled 2023-06-16: qty 30, 30d supply, fill #0

## 2023-06-16 MED ORDER — ATORVASTATIN CALCIUM 40 MG PO TABS
40.0000 mg | ORAL_TABLET | Freq: Every day | ORAL | 1 refills | Status: AC
  Filled 2023-06-16: qty 90, 90d supply, fill #0

## 2023-06-19 DIAGNOSIS — M1712 Unilateral primary osteoarthritis, left knee: Secondary | ICD-10-CM | POA: Diagnosis not present

## 2023-06-19 DIAGNOSIS — M1711 Unilateral primary osteoarthritis, right knee: Secondary | ICD-10-CM | POA: Diagnosis not present

## 2023-06-30 ENCOUNTER — Other Ambulatory Visit (HOSPITAL_BASED_OUTPATIENT_CLINIC_OR_DEPARTMENT_OTHER): Payer: Self-pay

## 2023-08-08 ENCOUNTER — Other Ambulatory Visit (HOSPITAL_BASED_OUTPATIENT_CLINIC_OR_DEPARTMENT_OTHER): Payer: Self-pay

## 2023-08-11 ENCOUNTER — Other Ambulatory Visit (HOSPITAL_BASED_OUTPATIENT_CLINIC_OR_DEPARTMENT_OTHER): Payer: Self-pay

## 2023-08-28 ENCOUNTER — Other Ambulatory Visit (HOSPITAL_BASED_OUTPATIENT_CLINIC_OR_DEPARTMENT_OTHER): Payer: Self-pay

## 2023-08-28 MED ORDER — ZEPBOUND 12.5 MG/0.5ML ~~LOC~~ SOAJ
12.5000 mg | SUBCUTANEOUS | 0 refills | Status: DC
  Filled 2023-08-28: qty 2, 28d supply, fill #0
  Filled 2023-09-22: qty 2, 28d supply, fill #1
  Filled 2023-10-20 – 2023-11-05 (×3): qty 2, 28d supply, fill #2

## 2023-08-30 ENCOUNTER — Other Ambulatory Visit (HOSPITAL_BASED_OUTPATIENT_CLINIC_OR_DEPARTMENT_OTHER): Payer: Self-pay

## 2023-09-22 ENCOUNTER — Other Ambulatory Visit (HOSPITAL_BASED_OUTPATIENT_CLINIC_OR_DEPARTMENT_OTHER): Payer: Self-pay

## 2023-10-20 ENCOUNTER — Other Ambulatory Visit (HOSPITAL_BASED_OUTPATIENT_CLINIC_OR_DEPARTMENT_OTHER): Payer: Self-pay

## 2023-10-28 ENCOUNTER — Other Ambulatory Visit (HOSPITAL_BASED_OUTPATIENT_CLINIC_OR_DEPARTMENT_OTHER): Payer: Self-pay

## 2023-10-30 ENCOUNTER — Other Ambulatory Visit (HOSPITAL_BASED_OUTPATIENT_CLINIC_OR_DEPARTMENT_OTHER): Payer: Self-pay

## 2023-10-31 ENCOUNTER — Other Ambulatory Visit (HOSPITAL_BASED_OUTPATIENT_CLINIC_OR_DEPARTMENT_OTHER): Payer: Self-pay

## 2023-11-05 ENCOUNTER — Other Ambulatory Visit (HOSPITAL_BASED_OUTPATIENT_CLINIC_OR_DEPARTMENT_OTHER): Payer: Self-pay

## 2023-12-02 ENCOUNTER — Other Ambulatory Visit (HOSPITAL_BASED_OUTPATIENT_CLINIC_OR_DEPARTMENT_OTHER): Payer: Self-pay

## 2023-12-02 MED ORDER — ZEPBOUND 12.5 MG/0.5ML ~~LOC~~ SOAJ
12.5000 mg | SUBCUTANEOUS | 0 refills | Status: AC
  Filled 2023-12-02: qty 2, 28d supply, fill #0

## 2023-12-08 ENCOUNTER — Other Ambulatory Visit (HOSPITAL_BASED_OUTPATIENT_CLINIC_OR_DEPARTMENT_OTHER): Payer: Self-pay

## 2023-12-08 MED ORDER — ZEPBOUND 15 MG/0.5ML ~~LOC~~ SOAJ
15.0000 mg | SUBCUTANEOUS | 1 refills | Status: DC
  Filled 2023-12-08 – 2023-12-31 (×2): qty 2, 28d supply, fill #0
  Filled 2024-01-23: qty 2, 28d supply, fill #1
  Filled 2024-02-24: qty 2, 28d supply, fill #2
  Filled 2024-03-19: qty 2, 28d supply, fill #3
  Filled 2024-04-20: qty 2, 28d supply, fill #4
  Filled 2024-05-14: qty 2, 28d supply, fill #5

## 2023-12-08 MED ORDER — ONDANSETRON HCL 4 MG PO TABS
4.0000 mg | ORAL_TABLET | Freq: Two times a day (BID) | ORAL | 1 refills | Status: AC | PRN
  Filled 2023-12-08 – 2023-12-31 (×2): qty 20, 10d supply, fill #0
  Filled 2024-03-19: qty 20, 10d supply, fill #1

## 2023-12-18 ENCOUNTER — Other Ambulatory Visit (HOSPITAL_BASED_OUTPATIENT_CLINIC_OR_DEPARTMENT_OTHER): Payer: Self-pay

## 2023-12-31 ENCOUNTER — Other Ambulatory Visit (HOSPITAL_BASED_OUTPATIENT_CLINIC_OR_DEPARTMENT_OTHER): Payer: Self-pay

## 2024-01-13 ENCOUNTER — Other Ambulatory Visit (HOSPITAL_BASED_OUTPATIENT_CLINIC_OR_DEPARTMENT_OTHER): Payer: Self-pay

## 2024-01-24 ENCOUNTER — Other Ambulatory Visit (HOSPITAL_BASED_OUTPATIENT_CLINIC_OR_DEPARTMENT_OTHER): Payer: Self-pay

## 2024-01-28 ENCOUNTER — Other Ambulatory Visit (HOSPITAL_BASED_OUTPATIENT_CLINIC_OR_DEPARTMENT_OTHER): Payer: Self-pay

## 2024-02-04 ENCOUNTER — Encounter: Payer: Self-pay | Admitting: Orthopaedic Surgery

## 2024-02-04 ENCOUNTER — Ambulatory Visit (INDEPENDENT_AMBULATORY_CARE_PROVIDER_SITE_OTHER): Admitting: Orthopaedic Surgery

## 2024-02-04 VITALS — Ht 62.0 in | Wt 237.0 lb

## 2024-02-04 DIAGNOSIS — M1711 Unilateral primary osteoarthritis, right knee: Secondary | ICD-10-CM | POA: Diagnosis not present

## 2024-02-04 DIAGNOSIS — M1712 Unilateral primary osteoarthritis, left knee: Secondary | ICD-10-CM

## 2024-02-04 DIAGNOSIS — G8929 Other chronic pain: Secondary | ICD-10-CM

## 2024-02-04 DIAGNOSIS — M25561 Pain in right knee: Secondary | ICD-10-CM | POA: Diagnosis not present

## 2024-02-04 DIAGNOSIS — M25562 Pain in left knee: Secondary | ICD-10-CM

## 2024-02-04 MED ORDER — METHYLPREDNISOLONE ACETATE 40 MG/ML IJ SUSP
40.0000 mg | INTRAMUSCULAR | Status: AC | PRN
  Administered 2024-02-04: 40 mg via INTRA_ARTICULAR

## 2024-02-04 MED ORDER — LIDOCAINE HCL 1 % IJ SOLN
3.0000 mL | INTRAMUSCULAR | Status: AC | PRN
  Administered 2024-02-04: 3 mL

## 2024-02-04 NOTE — Progress Notes (Signed)
 The patient is a pleasant 48 year old female that has been seen in the office before.  She has a history of both her knees being painful and she does have a remote history of ligamentous reconstruction of her left knee.  She has had arthroscopic surgery on both knees.  She is a Engineer, civil (consulting) at [employer] and is known well to us .  She is been having a lot of cracking and popping in her knees.  She is also someone has been on a weight loss journey.  Her highest she was up to 282 pounds.  Today she is down to 237 pounds.  She is on a GLP-1 which is helping her quite a bit.  She is not a diabetic.  It has been 5 months since she has had steroid injections in her knees.  On exam both knees have slight varus malalignment.  There is not a large soft tissue envelope around either knee and neither knee has an effusion today but there is significant patellofemoral crepitation and pain throughout the arc of motion of the exam of both knees.  An AP and lateral both knees from April of last year shows significant arthritic changes with both knees with varus malalignment and bone-on-bone wear of the patellofemoral joint and significant medial compartment narrowing.  She had x-rays done at [facility] this year showing the same arthritis that has been slowly worsening.  She does have a significant mount of tricompartment thrice.  She will continue her weight loss journey.  I did recommend steroid injections in both knees today and she agreed to this and tolerated them well.  Will see her back in 3 months with a repeat weight and BMI calculation.  She agrees with this treatment plan.    Procedure Note  Patient: Annette Deleon             Date of Birth: 1975/08/22           MRN: [id number]             Visit Date: 02/04/2024  Procedures: Visit Diagnoses:  1. Unilateral primary osteoarthritis, right knee   2. Unilateral primary osteoarthritis, left knee   3. Chronic pain of left knee   4. Chronic pain of right knee      Large Joint Inj: R knee on 02/04/2024 3:14 PM Indications: diagnostic evaluation and pain Details: 22 G 1.5 in needle, superolateral approach  Arthrogram: No  Medications: 3 mL lidocaine  1 %; 40 mg methylPREDNISolone  acetate 40 MG/ML Outcome: tolerated well, no immediate complications Procedure, treatment alternatives, risks and benefits explained, specific risks discussed. Consent was given by the patient. Immediately prior to procedure a time out was called to verify the correct patient, procedure, equipment, support staff and site/side marked as required. Patient was prepped and draped in the usual sterile fashion.    Large Joint Inj: L knee on 02/04/2024 3:14 PM Indications: diagnostic evaluation and pain Details: 22 G 1.5 in needle, superolateral approach  Arthrogram: No  Medications: 3 mL lidocaine  1 %; 40 mg methylPREDNISolone  acetate 40 MG/ML Outcome: tolerated well, no immediate complications Procedure, treatment alternatives, risks and benefits explained, specific risks discussed. Consent was given by the patient. Immediately prior to procedure a time out was called to verify the correct patient, procedure, equipment, support staff and site/side marked as required. Patient was prepped and draped in the usual sterile fashion.

## 2024-02-18 ENCOUNTER — Other Ambulatory Visit (HOSPITAL_BASED_OUTPATIENT_CLINIC_OR_DEPARTMENT_OTHER): Payer: Self-pay

## 2024-02-18 MED ORDER — LINZESS 145 MCG PO CAPS
145.0000 ug | ORAL_CAPSULE | Freq: Every day | ORAL | 0 refills | Status: AC
  Filled 2024-02-18: qty 90, 90d supply, fill #0

## 2024-02-24 ENCOUNTER — Other Ambulatory Visit (HOSPITAL_BASED_OUTPATIENT_CLINIC_OR_DEPARTMENT_OTHER): Payer: Self-pay

## 2024-02-25 ENCOUNTER — Other Ambulatory Visit (HOSPITAL_BASED_OUTPATIENT_CLINIC_OR_DEPARTMENT_OTHER): Payer: Self-pay

## 2024-03-19 ENCOUNTER — Other Ambulatory Visit (HOSPITAL_BASED_OUTPATIENT_CLINIC_OR_DEPARTMENT_OTHER): Payer: Self-pay

## 2024-03-24 ENCOUNTER — Other Ambulatory Visit (HOSPITAL_BASED_OUTPATIENT_CLINIC_OR_DEPARTMENT_OTHER): Payer: Self-pay

## 2024-03-29 ENCOUNTER — Encounter (HOSPITAL_BASED_OUTPATIENT_CLINIC_OR_DEPARTMENT_OTHER): Payer: Self-pay

## 2024-03-29 ENCOUNTER — Ambulatory Visit (HOSPITAL_BASED_OUTPATIENT_CLINIC_OR_DEPARTMENT_OTHER): Admitting: Student

## 2024-04-05 ENCOUNTER — Encounter: Payer: Self-pay | Admitting: Radiology

## 2024-04-20 ENCOUNTER — Other Ambulatory Visit (HOSPITAL_BASED_OUTPATIENT_CLINIC_OR_DEPARTMENT_OTHER): Payer: Self-pay

## 2024-04-20 ENCOUNTER — Other Ambulatory Visit (HOSPITAL_COMMUNITY): Payer: Self-pay

## 2024-04-20 ENCOUNTER — Other Ambulatory Visit: Payer: Self-pay

## 2024-04-21 ENCOUNTER — Ambulatory Visit: Admitting: Orthopaedic Surgery

## 2024-04-21 ENCOUNTER — Other Ambulatory Visit: Payer: Self-pay

## 2024-04-21 VITALS — Ht 62.0 in | Wt 235.0 lb

## 2024-04-21 DIAGNOSIS — M25551 Pain in right hip: Secondary | ICD-10-CM

## 2024-04-21 MED ORDER — LIDOCAINE HCL 1 % IJ SOLN
3.0000 mL | INTRAMUSCULAR | Status: AC | PRN
  Administered 2024-04-21: 3 mL

## 2024-04-21 MED ORDER — METHYLPREDNISOLONE ACETATE 40 MG/ML IJ SUSP
40.0000 mg | INTRAMUSCULAR | Status: AC | PRN
  Administered 2024-04-21: 40 mg via INTRA_ARTICULAR

## 2024-04-21 NOTE — Progress Notes (Signed)
 Annette Deleon is a regular patient of ours.  She is 48 years old and has been dealing with bilateral knee issues for some time now and has had arthroscopic surgery on her knees.  She has been dealing recently with right hip pain and she points to the lateral aspect of her hip as a source of her pain.  If she has been riding an SUV for a while or laying on the right side is very painful on the side of that hip.  She denies any groin pain.  On exam her right hip moves smoothly and fluidly with no blocks or rotation and no pain in the groin at all.  She does have significant pain palpation over the right hip trochanteric area.  X-rays of the pelvis and right hip show both her hips are well located and well-maintained in terms of the joint space.  There are no cortical irregularities around either hip or the trochanteric area.  Her signs and symptoms are consistent with significant trochanteric bursitis and tendinitis.  I did recommend a steroid injection over the area of maximal tenderness and she agreed to this and tolerated well.  She does have GI issues she is she cannot take traditional anti-inflammatories.  I did talk to her about stretching exercises and I have recommended outpatient physical therapy for her to consider if she is not getting any better.  She will let us  know if she would like to have this set up.  All question concerns were answered addressed.  Again she did tolerate the steroid injection well over her right hip trochanteric area.    Procedure Note  Patient: Annette Deleon             Date of Birth: [birth year]           MRN: [id number]             Visit Date: 04/21/2024  Procedures: Visit Diagnoses:  1. Pain in right hip     Large Joint Inj: R greater trochanter on 04/21/2024 11:33 AM Indications: pain and diagnostic evaluation Details: 22 G 1.5 in needle, lateral approach  Arthrogram: No  Medications: 3 mL lidocaine  1 %; 40 mg methylPREDNISolone  acetate 40 MG/ML Outcome:  tolerated well, no immediate complications Procedure, treatment alternatives, risks and benefits explained, specific risks discussed. Consent was given by the patient. Immediately prior to procedure a time out was called to verify the correct patient, procedure, equipment, support staff and site/side marked as required. Patient was prepped and draped in the usual sterile fashion.

## 2024-05-05 ENCOUNTER — Ambulatory Visit: Admitting: Orthopaedic Surgery

## 2024-05-14 ENCOUNTER — Other Ambulatory Visit (HOSPITAL_BASED_OUTPATIENT_CLINIC_OR_DEPARTMENT_OTHER): Payer: Self-pay

## 2024-05-31 ENCOUNTER — Ambulatory Visit: Admitting: Orthopaedic Surgery

## 2024-06-09 ENCOUNTER — Ambulatory Visit: Admitting: Orthopaedic Surgery

## 2024-06-14 ENCOUNTER — Other Ambulatory Visit (HOSPITAL_BASED_OUTPATIENT_CLINIC_OR_DEPARTMENT_OTHER): Payer: Self-pay

## 2024-06-15 ENCOUNTER — Other Ambulatory Visit (HOSPITAL_BASED_OUTPATIENT_CLINIC_OR_DEPARTMENT_OTHER): Payer: Self-pay

## 2024-06-16 ENCOUNTER — Other Ambulatory Visit (HOSPITAL_BASED_OUTPATIENT_CLINIC_OR_DEPARTMENT_OTHER): Payer: Self-pay

## 2024-06-16 MED ORDER — ZEPBOUND 15 MG/0.5ML ~~LOC~~ SOAJ: 15.0000 mg | SUBCUTANEOUS | 0 refills | Status: AC

## 2024-06-16 MED ORDER — TIRZEPATIDE-WEIGHT MANAGEMENT 15 MG/0.5ML ~~LOC~~ SOAJ
15.0000 mg | SUBCUTANEOUS | 0 refills | Status: DC
  Filled 2024-06-16: qty 2, 28d supply, fill #0

## 2024-06-17 ENCOUNTER — Other Ambulatory Visit (HOSPITAL_BASED_OUTPATIENT_CLINIC_OR_DEPARTMENT_OTHER): Payer: Self-pay

## 2024-06-28 ENCOUNTER — Other Ambulatory Visit (HOSPITAL_BASED_OUTPATIENT_CLINIC_OR_DEPARTMENT_OTHER): Payer: Self-pay

## 2024-06-28 MED ORDER — ZEPBOUND 15 MG/0.5ML ~~LOC~~ SOAJ
15.0000 mg | SUBCUTANEOUS | 0 refills | Status: AC
  Filled 2024-06-28: qty 2, 28d supply, fill #0

## 2024-06-28 MED ORDER — DIALYVITE VITAMIN D3 MAX 1.25 MG (50000 UT) PO TABS: 1.0000 | ORAL_TABLET | ORAL | 0 refills | Status: AC

## 2024-07-06 ENCOUNTER — Other Ambulatory Visit (HOSPITAL_BASED_OUTPATIENT_CLINIC_OR_DEPARTMENT_OTHER): Payer: Self-pay

## 2024-07-06 MED ORDER — ONDANSETRON HCL 4 MG PO TABS
4.0000 mg | ORAL_TABLET | Freq: Two times a day (BID) | ORAL | 1 refills | Status: AC | PRN
  Filled 2024-07-06: qty 20, 10d supply, fill #0

## 2024-07-06 MED ORDER — ZEPBOUND 15 MG/0.5ML ~~LOC~~ SOAJ
15.0000 mg | SUBCUTANEOUS | 0 refills | Status: AC
  Filled 2024-07-06: qty 6, 84d supply, fill #0

## 2024-07-07 ENCOUNTER — Other Ambulatory Visit (HOSPITAL_BASED_OUTPATIENT_CLINIC_OR_DEPARTMENT_OTHER): Payer: Self-pay

## 2024-07-28 ENCOUNTER — Ambulatory Visit: Admitting: Orthopaedic Surgery
